# Patient Record
Sex: Female | Born: 1965 | Race: White | Hispanic: No | Smoking: Former smoker
Health system: Southern US, Community
[De-identification: ages and names within clinical notes are randomized; demographics above are authoritative.]

## PROBLEM LIST (undated history)

## (undated) DIAGNOSIS — G4733 Obstructive sleep apnea (adult) (pediatric): Secondary | ICD-10-CM

## (undated) DIAGNOSIS — M545 Low back pain, unspecified: Secondary | ICD-10-CM

## (undated) DIAGNOSIS — J449 Chronic obstructive pulmonary disease, unspecified: Secondary | ICD-10-CM

## (undated) DIAGNOSIS — M109 Gout, unspecified: Secondary | ICD-10-CM

## (undated) DIAGNOSIS — E079 Disorder of thyroid, unspecified: Secondary | ICD-10-CM

## (undated) DIAGNOSIS — M1990 Unspecified osteoarthritis, unspecified site: Secondary | ICD-10-CM

## (undated) DIAGNOSIS — G629 Polyneuropathy, unspecified: Secondary | ICD-10-CM

## (undated) DIAGNOSIS — E039 Hypothyroidism, unspecified: Secondary | ICD-10-CM

## (undated) DIAGNOSIS — G5600 Carpal tunnel syndrome, unspecified upper limb: Secondary | ICD-10-CM

## (undated) DIAGNOSIS — E611 Iron deficiency: Secondary | ICD-10-CM

## (undated) DIAGNOSIS — G8929 Other chronic pain: Secondary | ICD-10-CM

## (undated) DIAGNOSIS — M25512 Pain in left shoulder: Secondary | ICD-10-CM

## (undated) DIAGNOSIS — I10 Essential (primary) hypertension: Secondary | ICD-10-CM

## (undated) DIAGNOSIS — M25569 Pain in unspecified knee: Secondary | ICD-10-CM

## (undated) DIAGNOSIS — E119 Type 2 diabetes mellitus without complications: Secondary | ICD-10-CM

## (undated) DIAGNOSIS — E785 Hyperlipidemia, unspecified: Secondary | ICD-10-CM

## (undated) DIAGNOSIS — J189 Pneumonia, unspecified organism: Secondary | ICD-10-CM

## (undated) DIAGNOSIS — R7303 Prediabetes: Secondary | ICD-10-CM

## (undated) DIAGNOSIS — K219 Gastro-esophageal reflux disease without esophagitis: Secondary | ICD-10-CM

## (undated) DIAGNOSIS — F32A Depression, unspecified: Secondary | ICD-10-CM

## (undated) DIAGNOSIS — Z9981 Dependence on supplemental oxygen: Secondary | ICD-10-CM

## (undated) DIAGNOSIS — F329 Major depressive disorder, single episode, unspecified: Secondary | ICD-10-CM

## (undated) DIAGNOSIS — E7800 Pure hypercholesterolemia, unspecified: Secondary | ICD-10-CM

## (undated) HISTORY — DX: Hyperlipidemia, unspecified: E78.5

## (undated) HISTORY — PX: KNEE SURGERY: SHX244

## (undated) HISTORY — DX: Other chronic pain: G89.29

## (undated) HISTORY — PX: COLONOSCOPY: SHX174

## (undated) HISTORY — DX: Chronic obstructive pulmonary disease, unspecified: J44.9

## (undated) HISTORY — PX: DILATION AND CURETTAGE OF UTERUS: SHX78

## (undated) HISTORY — DX: Gout, unspecified: M10.9

## (undated) HISTORY — PX: CHOLECYSTECTOMY: SHX55

## (undated) HISTORY — PX: TUBAL LIGATION: SHX77

## (undated) HISTORY — DX: Disorder of thyroid, unspecified: E07.9

## (undated) HISTORY — DX: Pain in unspecified knee: M25.569

## (undated) HISTORY — PX: LAPAROSCOPIC CHOLECYSTECTOMY: SUR755

---
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
## 2007-06-23 ENCOUNTER — Ambulatory Visit: Payer: Self-pay | Admitting: Physical Medicine and Rehabilitation

## 2007-06-23 ENCOUNTER — Encounter
Admission: RE | Admit: 2007-06-23 | Discharge: 2007-09-21 | Payer: Self-pay | Admitting: Physical Medicine and Rehabilitation

## 2007-07-15 ENCOUNTER — Encounter
Admission: RE | Admit: 2007-07-15 | Discharge: 2007-07-16 | Payer: Self-pay | Admitting: Physical Medicine and Rehabilitation

## 2007-08-25 HISTORY — PX: CARPAL TUNNEL RELEASE: SHX101

## 2007-08-25 HISTORY — PX: NEUROPLASTY / TRANSPOSITION ULNAR NERVE AT ELBOW: SUR895

## 2007-09-27 ENCOUNTER — Encounter
Admission: RE | Admit: 2007-09-27 | Discharge: 2007-09-27 | Payer: Self-pay | Admitting: Physical Medicine and Rehabilitation

## 2010-09-18 ENCOUNTER — Emergency Department (HOSPITAL_COMMUNITY)
Admission: EM | Admit: 2010-09-18 | Discharge: 2010-09-18 | Payer: Self-pay | Source: Home / Self Care | Admitting: Emergency Medicine

## 2011-01-06 NOTE — Group Therapy Note (Signed)
Tabitha Scott is a 45 year old married obese female who has been referred  by her orthopedist, Dr. Tally Joe, who has a practice in [place], [place].   Tabitha Scott sought Dr. Genoveva Ill assistance in helping to manage her low  back pain.  A note from Dr. Tally Joe is sent over stating that she has had  a previous MRI at [facility] June 2007.  There are no x-rays.  She had  been told she had degenerative disk disease and had a referral to a  neurologist for elbow pain.   AP lateral lumbar radiographs done in Dr. Genoveva Ill office per his note  show bony structure and alignment appearing normal.  No soft tissue  masses identified.  Mild degenerative disk disease at L5-S1, otherwise  disk space is well maintained without any osteophytes.  No  spondylolisthesis or Pars defects noted.  She was assessed to have  mechanical low back pain and was recommended to have therapy and to lose  weight and she was referred to our facility.   She states that her chief complaint today is low back pain.  She does  have multiple other pain complaints including cervicalgia, bilateral arm  pain, bilateral lower extremity pain.  Her number one pain, however, is  her low back.   Pain is worse with standing.  She feels a little better.  She flex a bit  forward while she stands such as when she is doing dishes.  She does put  weight on the counter to do dishes.  She states her average pain is  about a 10 on a scale of 10 interfering significantly with her activity  level and enjoyment of life.  She is able to sleep fairly well at night.  Pain is typically worse during the day, evening, and night, fairly  constant throughout the day.  Pain is worse with walking, sitting,  standing, improves with rest.   She is currently not on any medications for her low back.  She does take  over-the-counter Tylenol 325 mg up to 3 times a day on a p.r.n. basis,  as well as ibuprofen up to 400 mg in the morning.  She has done this  for  the last 2 years.   She is able to walk about 3 minutes at a time.  She has difficulty with  stairs.  She does not drive.  She needs assistance with dressing and  bathing.  She states she no longer wears underwear or a bra because of  difficulty getting these garments on.   She has difficulty with some household duties.  She states part of it is  that she drops things with her left hand frequently and has elbow pain  as well.   She admits to numbness and tingling, trouble walking, spasms,  depression.  Denies suicidal ideation.  Denies problems controlling  bowel or bladder but states that sometimes she cannot get to the  bathroom quite quick enough a couple of times a month.   She does have some complaints of limb swelling, otherwise review of  systems is otherwise noncontributory.   Physicians involved in her care currently include Dr. Nedra Hai who is her  family doctor.  He has referred her to Dr. Tally Joe, her orthopedist, as  well as Dr. Adella Hare who is her neurologist currently.   She states that her neurologist is currently ordering some testing for  her.  She apparently may have a right lung mass and may have already had  a chest CT.  She is also scheduled for a brain MRI to rule out multiple  sclerosis.   I am not sure what other testing she has had done.  I believe it sounds  as though she may have had an EMG nerve conduction study once or twice.  She has also been referred to a psychiatrist.  I do not have the name of  that professional, however.   PAST MEDICAL HISTORY:  1. Notable for morbid obesity.  2. Hypertension.  3. Gastroesophageal reflux disease.  4. Hyperlipidemia.  5. COPD.  6. History of migraines.  7. History of peripheral edema.  8. A vocal cord polyp.  9. History of tobacco use.  10.Pelvic pain.   She is currently not on any medications.  SHE HAS AN ALLERGY TO TETANUS  INJECTION.   SOCIAL HISTORY:  Patient lives with her husband and 3 children,  73 years  old, 50 years old, and 47 years old.  She denies any illegal substance  use.  She denies alcohol use.  She does smoke about a half a pack of  cigarettes a day.  She has cut down dramatically from I believe 2 packs  a day.   FAMILY HISTORY:  Remarkable for lung disease, diabetes, and disability.  She does not have much information regarding her parent's illnesses or  problems.  She does believe there is some cancer in the family as well.   On exam today, her blood pressure is 118/83.  Pulse 84.  Respirations 20  and 97% saturated on room air.  She is well-developed, morbidly obese  female who weighs approximately 308 pounds.   She is oriented x3.  Her speech is clear.  Her affect is bright.  She is  alert.  She is cooperative and pleasant and follows commands without  difficulty.  She is noted to be quite labile during the exam, crying at  multiple times during our interview and during the exam itself.   She is able to transition from sitting to standing with a minimal amount  of difficulty.  Standing she has slightly flexed knees.  She complains  of low back pain while she is standing.  She has limitations in lumbar  motion in all planes and complains when she extends, as well as flexes  even minimally.  Her cervical range of motion is mildly limited,  especially with rotation to the right.  She lacks about 10 degrees.  Flexion/extension appears to be minimally limited as well.  She has full  shoulder range of motion bilaterally.  She has well-healed scars over  her elbow region bilaterally, as well as her palmar surfaces.   Her gait is slightly wide based.  She has difficulty performing tandem  gait due to her size.  A Romberg test is performed adequately, however.   Seated, her reflexes are slightly brisk overall.  Downgoing toes noted  on the right, equivocal is on the left.  No abnormal tone is noted in  the upper or lower extremities.  Motor strength is quite good.   She has  some __________ weakness in both left upper and left lower extremity,  however, with some coaching she is able to give me some at least 4+/5  strength throughout both of these limbs without obvious focal deficit.   Her laboratory sense is intact.  Her position sense is intact in the  lower extremities.  She reports diminished sensation to pin prick  throughout the left upper extremity,  as well as the left lower extremity  in their entirety.  Right upper and lower extremity appeared to be  intact to pin prick, as well as light touch.   She has multiple areas of tenderness throughout her cervical paraspinal  region and parascapular region, medial elbows, medial knees, and in the  lower lumbar paraspinal musculature into the gluteal musculature  exquisitely tender over the lateral epicondyles bilaterally, as well as  the trochanters bilaterally.   IMPRESSION:  This is a 45 year old morbidly obese female who has  multiple pain complaints which include the following:  1. Lumbago.  2. Cervicalgia.  3. Multiple tender points throughout body, may be consistent with      fibromyalgia type overlay.  She has tenderness over the lateral      epicondyles and trochanters consistent with moderate epicondylitis,      as well as trochanteric bursitis cervicalgia.  She has had multiple      arm surgeries related to numbness in both upper extremities and      complaints of dropping items with the left hand.   She is currently undergoing workup with her neurologist.  I really do  not have any details regarding this workup.  We would like to get some  more information regarding her previous studies and current studies that  have been ordered prior to providing a pain care plan at this point.  We  will trial her with some Lidoderm, may consider something like BioFreeze  as well.  Physical therapy may also be an option for her addressing some  of the tendinitis she seems to have.  May consider  addressing tender  points treating her fibromyalgia as well.  However, we need to really  get some more information on this woman before engaging in multiple  treatments.  We will see her back in a month.  I will attempt to get a  hold of Dr. Adella Hare.           ______________________________  Brantley Stage, M.D.     DMK/MedQ  D:  06/24/2007 10:57:52  T:  06/24/2007 17:08:38  Job #:  [id number]

## 2011-01-06 NOTE — Assessment & Plan Note (Signed)
Tabitha Scott is a 45 year old, married, obese female who has been referred  by Dr. Tally Joe.  She is accompanied by her husband today for recheck here  in our pain and rehabilitative clinic.  She was initially seen on  October 21st, last month.  She had been following up with Dr. Adella Hare.  In the interim, I have given him a call and he was kind enough to send  over some notes regarding a workup he was in the process of with her.  His last note dated June 27, 2007, states that he has ordered a CT  scan of her chest, also an MRI of her brain.  There had been some  concern about possible demyelinating disease as cause for her numbness.  She has also in the interim, seen psychiatry who is treating her with  Zoloft for her depression and anxiety.  She has got questions regarding  disability today.   She states her average pain is about an 8-10 on a scale of 10.  Her pain  is predominantly localized to the cervical region and lumbar region.  She describes cervical region as about an 8 on a scale of 10, the lumbar  is about a 10 on a scale of 10.  The left elbow area is about an 8.  The  right elbow area is about a 6.  She gets some intermittent numbness when  she is up walking in the regions around both knees.  Neck is described  as feeling somewhat stiff.  Back pain is sharper in nature.  The pain is  typically worse with activity and also is bothersome when she is  sedentary.  The pain improves with rest overall.   MEDICATIONS:  She was tried on Lidoderm last month.  She is not sure it  gave her much relief.   She is able to walk about 5 minutes at a time.  She does not drive.  She  reports requiring some assistance getting her bra on and washing her  hair.  Independent with feeding and toileting.  She needs some  assistance with meal prep, household duties, and shopping.   She admits to some depression, denies suicidal ideation.  She admits to  some numbness and tingling in her  extremities.   Currently, she is followed by Dr. Nedra Hai, Dr. Adella Hare, and Dr. Estill Batten.   The patient smokes about a half pack of cigarettes a day.   PHYSICAL EXAMINATION:  VITAL SIGNS:  Blood pressure is 151/84, pulse 83,  respirations 18, 98% saturated on room air.  GENERAL:  She is an obese, white female who does not appear in any  distress.  NEUROLOGIC:  She is oriented x3.  Her speech is clear.  Her affect is  bright.  She is alert, cooperative, and pleasant.  She follows commands  without difficulty.  MUSCULOSKELETAL:  Transitioning from sitting to standing is done without  difficulty.  Gait in the room displays good balance.  Tandem gait and  Romberg tests are performed adequately.  Mild limitations are noted in  cervical range of motion, especially with rotation to the right.  She  has full shoulder range of motion.  Lumbar range is minimally limited as  well.  She reports significant discomfort with palpation, especially in  the right lumbar paraspinal muscles.  Reflexes are 2 plus in the upper  extremities as well as lower extremities, they are intact.  No abnormal  tone is noted.  Motor strength is 5/5 in  both upper and lower  extremities.  She is noted to have bilateral lower extremity edema.   IMPRESSION:  1. Morbid obesity.  2. Bilateral lymphedema.  3. Lumbago.  4. Cervicalgia.  5. Intermittent tingling in all extremities, multiple tender points.   She is currently undergoing workup for multiple sclerosis.  Apparently  there may have been a lesion found in the MRI.  I do not have records  regarding this.   PLAN:  1. We will trial her on some Neurontin 300 mg one p.o. q.h.s. x3 days,      then b.i.d. x3 days, and then t.i.d. for the rest of the month.  2. We will also add Nexium 20 mg one p.o. daily, #30.  3. Discussed how to use ibuprofen and Tylenol appropriately,      discussing appropriate doses and use of both of these medications.  4. We will see her back  in 6 weeks and assess how she is doing on the      Neurontin at that time.           ______________________________  Brantley Stage, M.D.     DMK/MedQ  D:  07/18/2007 14:43:01  T:  07/18/2007 16:59:29  Job #:  [id number]

## 2013-08-24 HISTORY — PX: JOINT REPLACEMENT: SHX530

## 2013-10-27 DIAGNOSIS — N871 Moderate cervical dysplasia: Secondary | ICD-10-CM | POA: Insufficient documentation

## 2015-03-12 ENCOUNTER — Emergency Department (HOSPITAL_COMMUNITY)
Admission: EM | Admit: 2015-03-12 | Discharge: 2015-03-12 | Disposition: A | Discharge status: Home / Self Care | Payer: Medicaid Other | Attending: Emergency Medicine | Admitting: Emergency Medicine

## 2015-03-12 ENCOUNTER — Encounter (HOSPITAL_COMMUNITY): Payer: Self-pay | Admitting: Physical Medicine and Rehabilitation

## 2015-03-12 DIAGNOSIS — R609 Edema, unspecified: Secondary | ICD-10-CM

## 2015-03-12 DIAGNOSIS — Z7951 Long term (current) use of inhaled steroids: Secondary | ICD-10-CM | POA: Insufficient documentation

## 2015-03-12 DIAGNOSIS — Z760 Encounter for issue of repeat prescription: Secondary | ICD-10-CM

## 2015-03-12 DIAGNOSIS — N289 Disorder of kidney and ureter, unspecified: Secondary | ICD-10-CM | POA: Insufficient documentation

## 2015-03-12 DIAGNOSIS — R600 Localized edema: Secondary | ICD-10-CM | POA: Insufficient documentation

## 2015-03-12 DIAGNOSIS — M79605 Pain in left leg: Secondary | ICD-10-CM | POA: Diagnosis present

## 2015-03-12 DIAGNOSIS — Z79899 Other long term (current) drug therapy: Secondary | ICD-10-CM | POA: Insufficient documentation

## 2015-03-12 LAB — CBC WITH DIFFERENTIAL/PLATELET
BASOS ABS: 0 10*3/uL (ref 0.0–0.1)
BASOS PCT: 0 % (ref 0–1)
EOS PCT: 3 % (ref 0–5)
Eosinophils Absolute: 0.2 10*3/uL (ref 0.0–0.7)
HCT: 33.3 % — ABNORMAL LOW (ref 36.0–46.0)
Hemoglobin: 10.4 g/dL — ABNORMAL LOW (ref 12.0–15.0)
LYMPHS ABS: 2.4 10*3/uL (ref 0.7–4.0)
Lymphocytes Relative: 41 % (ref 12–46)
MCH: 28.6 pg (ref 26.0–34.0)
MCHC: 31.2 g/dL (ref 30.0–36.0)
MCV: 91.5 fL (ref 78.0–100.0)
Monocytes Absolute: 0.4 10*3/uL (ref 0.1–1.0)
Monocytes Relative: 7 % (ref 3–12)
Neutro Abs: 3 10*3/uL (ref 1.7–7.7)
Neutrophils Relative %: 49 % (ref 43–77)
Platelets: 188 10*3/uL (ref 150–400)
RBC: 3.64 MIL/uL — AB (ref 3.87–5.11)
RDW: 16.2 % — ABNORMAL HIGH (ref 11.5–15.5)
WBC: 6 10*3/uL (ref 4.0–10.5)

## 2015-03-12 LAB — COMPREHENSIVE METABOLIC PANEL
ALBUMIN: 3.5 g/dL (ref 3.5–5.0)
ALT: 27 U/L (ref 14–54)
ANION GAP: 8 (ref 5–15)
AST: 32 U/L (ref 15–41)
Alkaline Phosphatase: 66 U/L (ref 38–126)
BILIRUBIN TOTAL: 0.8 mg/dL (ref 0.3–1.2)
BUN: 6 mg/dL (ref 6–20)
CHLORIDE: 105 mmol/L (ref 101–111)
CO2: 27 mmol/L (ref 22–32)
Calcium: 8.9 mg/dL (ref 8.9–10.3)
Creatinine, Ser: 1.16 mg/dL — ABNORMAL HIGH (ref 0.44–1.00)
GFR calc non Af Amer: 54 mL/min — ABNORMAL LOW (ref >60)
Glucose, Bld: 90 mg/dL (ref 65–99)
Potassium: 3.5 mmol/L (ref 3.5–5.1)
Sodium: 140 mmol/L (ref 135–145)
TOTAL PROTEIN: 6.6 g/dL (ref 6.5–8.1)

## 2015-03-12 LAB — BRAIN NATRIURETIC PEPTIDE: B NATRIURETIC PEPTIDE 5: 98.7 pg/mL (ref 0.0–100.0)

## 2015-03-12 MED ORDER — FUROSEMIDE 40 MG PO TABS: 40.0000 mg | ORAL_TABLET | Freq: Every day | ORAL | Status: DC

## 2015-03-12 MED ORDER — GABAPENTIN 300 MG PO CAPS
300.0000 mg | ORAL_CAPSULE | ORAL | Status: AC
  Administered 2015-03-12: 300 mg via ORAL
  Filled 2015-03-12: qty 1

## 2015-03-12 MED ORDER — FUROSEMIDE 10 MG/ML IJ SOLN
40.0000 mg | Freq: Once | INTRAMUSCULAR | Status: AC
  Administered 2015-03-12: 40 mg via INTRAVENOUS
  Filled 2015-03-12: qty 4

## 2015-03-12 MED ORDER — GABAPENTIN 600 MG PO TABS
300.0000 mg | ORAL_TABLET | ORAL | Status: DC
  Filled 2015-03-12: qty 0.5

## 2015-03-12 MED ORDER — METOLAZONE 5 MG PO TABS: 5.0000 mg | ORAL_TABLET | Freq: Every day | ORAL | Status: DC

## 2015-03-12 MED ORDER — GABAPENTIN 300 MG PO CAPS: 300.0000 mg | ORAL_CAPSULE | Freq: Two times a day (BID) | ORAL | Status: DC

## 2015-03-12 NOTE — ED Notes (Signed)
Pt presents to department for evaluation of bilateral leg and foot pain. States she ran out of Lasix several months ago. Now states 6/10 pain to bilateral legs and feet. Pt is alert and oriented x4.

## 2015-03-12 NOTE — Discharge Instructions (Signed)
Read the information below.  Use the prescribed medication as directed.  Please discuss all new medications with your pharmacist.  You may return to the Emergency Department at any time for worsening condition or any new symptoms that concern you.    Please follow up with Dr Ashley RoyaltyMatthews within the next 1-2 weeks to establish care and recheck your kidney function.    Edema Edema is an abnormal buildup of fluids. It is more common in your legs and thighs. Painless swelling of the feet and ankles is more likely as a person ages. It also is common in looser skin, like around your eyes. HOME CARE   Keep the affected body part above the level of the heart while lying down.  Do not sit still or stand for a long time.  Do not put anything right under your knees when you lie down.  Do not wear tight clothes on your upper legs.  Exercise your legs to help the puffiness (swelling) go down.  Wear elastic bandages or support stockings as told by your doctor.  A low-salt diet may help lessen the puffiness.  Only take medicine as told by your doctor. GET HELP IF:  Treatment is not working.  You have heart, liver, or kidney disease and notice that your skin looks puffy or shiny.  You have puffiness in your legs that does not get better when you raise your legs.  You have sudden weight gain for no reason. GET HELP RIGHT AWAY IF:   You have shortness of breath or chest pain.  You cannot breathe when you lie down.  You have pain, redness, or warmth in the areas that are puffy.  You have heart, liver, or kidney disease and get edema all of a sudden.  You have a fever and your symptoms get worse all of a sudden. MAKE SURE YOU:   Understand these instructions.  Will watch your condition.  Will get help right away if you are not doing well or get worse. Document Released: 01/27/2008 Document Revised: 08/15/2013 Document Reviewed: 06/02/2013 [facility] Patient Information 2015 [place], [place].  This information is not intended to replace advice given to you by your health care provider. Make sure you discuss any questions you have with your health care provider.  Medication Refill, Emergency Department We have refilled your medication today as a courtesy to you. It is best for your medical care, however, to take care of getting refills done through your primary caregiver's office. They have your records and can do a better job of follow-up than we can in the emergency department. On maintenance medications, we often only prescribe enough medications to get you by until you are able to see your regular caregiver. This is a more expensive way to refill medications. In the future, please plan for refills so that you will not have to use the emergency department for this. Thank you for your help. Your help allows us to better take care of the daily emergencies that enter our department. Document Released: 11/27/2003 Document Revised: 11/02/2011 Document Reviewed: 11/17/2013 [facility]ExitCare Patient Information 2015 [place], [place]. This information is not intended to replace advice given to you by your health care provider. Make sure you discuss any questions you have with your health care provider.

## 2015-03-12 NOTE — ED Provider Notes (Signed)
CSN: [id number]     Arrival date & time 03/12/15  1252 History   First MD Initiated Contact with Patient 03/12/15 1608     Chief Complaint  Patient presents with  . Leg Pain  . Foot Pain     (Consider location/radiation/quality/duration/timing/severity/associated sxs/prior Treatment) The history is provided by the patient.     Pt  With hx neuropathy, peripheral edema, unknown other medical problems p/w two months of swelling and pain of the bilateral lower extremities.  States she has been unable to fill some of her prescriptions due to problems with medicaid.   States she does not think she has diabetes or heart problems but does not know why her legs swell.   Denies recent immobilization, exogenous estrogen, history of blood clots.  Denies fevers, chills, CP, SOB, cough.  She is not out of lasix but is out of metolazone.    History reviewed. No pertinent past medical history. History reviewed. No pertinent past surgical history. No family history on file. History  Substance Use Topics  . Smoking status: Never Smoker   . Smokeless tobacco: Not on file  . Alcohol Use: No   OB History    No data available     Review of Systems  All other systems reviewed and are negative.     Allergies  Tetanus toxoids  Home Medications   Prior to Admission medications   Medication Sig Start Date End Date Taking? Authorizing Provider  atorvastatin (LIPITOR) 40 MG tablet Take 40 mg by mouth daily.   Yes Historical Provider, MD  Fluticasone-Salmeterol (ADVAIR) 250-50 MCG/DOSE AEPB Inhale 1 puff into the lungs 2 (two) times daily.   Yes Historical Provider, MD  furosemide (LASIX) 40 MG tablet Take 40 mg by mouth daily.   Yes Historical Provider, MD  gabapentin (NEURONTIN) 300 MG capsule Take 300 mg by mouth 2 (two) times daily.   Yes Historical Provider, MD  levothyroxine (SYNTHROID, LEVOTHROID) 25 MCG tablet Take 25 mcg by mouth daily before breakfast.   Yes Historical Provider, MD   linagliptin (TRADJENTA) 5 MG TABS tablet Take 5 mg by mouth daily.   Yes Historical Provider, MD  Liraglutide (VICTOZA) 18 MG/3ML SOPN Inject 1.8 mg into the skin daily.   Yes Historical Provider, MD  meloxicam (MOBIC) 15 MG tablet Take 15 mg by mouth daily.   Yes Historical Provider, MD  metolazone (ZAROXOLYN) 5 MG tablet Take 5 mg by mouth daily. 30 mins prior to furosemide   Yes Historical Provider, MD  omeprazole (PRILOSEC) 40 MG capsule Take 40 mg by mouth daily.   Yes Historical Provider, MD  potassium chloride (MICRO-K) 10 MEQ CR capsule Take 10 mEq by mouth daily.   Yes Historical Provider, MD  tiZANidine (ZANAFLEX) 4 MG capsule Take 4 mg by mouth 3 (three) times daily as needed for muscle spasms.   Yes Historical Provider, MD   BP 146/56 mmHg  Pulse 87  Temp(Src) 98.6 F (37 C) (Oral)  Resp 16  Ht  (1.626 m)  Wt 280 lb (127.007 kg)  BMI 48.04 kg/m2  SpO2 95% Physical Exam  Constitutional: She appears well-developed and well-nourished. No distress.  HENT:  Head: Normocephalic and atraumatic.  Neck: Neck supple.  Cardiovascular: Normal rate and regular rhythm.   Tight nonpitting edema of bilateral lower extremities to proximal shin.  No erythema, warmth.  Distal pulses intact, sensation intact, motor ability intact.    Pulmonary/Chest: Effort normal and breath sounds normal. No respiratory distress. She  has no wheezes. She has no rales.  Abdominal: Soft. She exhibits no distension. There is no tenderness. There is no rebound and no guarding.  obese  Neurological: She is alert.  Skin: She is not diaphoretic.  Psychiatric: She has a normal mood and affect. Her behavior is normal.  Nursing note and vitals reviewed.   ED Course  Procedures (including critical care time) Labs Review Labs Reviewed  COMPREHENSIVE METABOLIC PANEL - Abnormal; Notable for the following:    Creatinine, Ser 1.16 (*)    GFR calc non Af Amer 54 (*)    All other components within normal limits   CBC WITH DIFFERENTIAL/PLATELET - Abnormal; Notable for the following:    RBC 3.64 (*)    Hemoglobin 10.4 (*)    HCT 33.3 (*)    RDW 16.2 (*)    All other components within normal limits  BRAIN NATRIURETIC PEPTIDE    Imaging Review No results found.   EKG Interpretation None      MDM   Final diagnoses:  Peripheral edema  Renal insufficiency  Medication refill    Afebrile, nontoxic patient with chronic peripheral edema on lasix and metolazone though she has run out of the metolazone.  She c/o bilateral lower extremity swelling and associated pain x months.  No risk factors for DVT.  Doesn't appear cellulitis.  Workup unremarkable.  No CHF.  Renal and hepatic function checked, pt does have some renal insufficiency - will need to have diuretics monitored closely.  Pt to start seeing Dr Marthann SchillerMichelle Matthews soon.  Pt feeling better after lasix and gabapentin in ED.  D/C home with refill of her gabapentin, lasix, metolazone, close PCP follow up.  Discussed result, findings, treatment, and follow up  with patient.  Pt given return precautions.  Pt verbalizes understanding and agrees with plan.         PennockEmily Scottie Stanish, PA-C 03/13/15 [id number]  Margarita Grizzleanielle Ray, MD 03/14/15 [phone]

## 2015-03-30 ENCOUNTER — Encounter (HOSPITAL_COMMUNITY): Payer: Self-pay | Admitting: Family Medicine

## 2015-03-30 ENCOUNTER — Emergency Department (HOSPITAL_COMMUNITY)
Admission: EM | Admit: 2015-03-30 | Discharge: 2015-03-30 | Disposition: A | Discharge status: Home / Self Care | Payer: Medicaid Other | Attending: Emergency Medicine | Admitting: Emergency Medicine

## 2015-03-30 DIAGNOSIS — R609 Edema, unspecified: Secondary | ICD-10-CM | POA: Diagnosis not present

## 2015-03-30 DIAGNOSIS — G629 Polyneuropathy, unspecified: Secondary | ICD-10-CM | POA: Insufficient documentation

## 2015-03-30 DIAGNOSIS — R2243 Localized swelling, mass and lump, lower limb, bilateral: Secondary | ICD-10-CM | POA: Diagnosis present

## 2015-03-30 DIAGNOSIS — Z7951 Long term (current) use of inhaled steroids: Secondary | ICD-10-CM | POA: Diagnosis not present

## 2015-03-30 DIAGNOSIS — Z791 Long term (current) use of non-steroidal anti-inflammatories (NSAID): Secondary | ICD-10-CM | POA: Diagnosis not present

## 2015-03-30 DIAGNOSIS — G8929 Other chronic pain: Secondary | ICD-10-CM | POA: Insufficient documentation

## 2015-03-30 DIAGNOSIS — Z79899 Other long term (current) drug therapy: Secondary | ICD-10-CM | POA: Insufficient documentation

## 2015-03-30 HISTORY — DX: Polyneuropathy, unspecified: G62.9

## 2015-03-30 LAB — URINALYSIS, ROUTINE W REFLEX MICROSCOPIC
Bilirubin Urine: NEGATIVE
GLUCOSE, UA: NEGATIVE mg/dL
Hgb urine dipstick: NEGATIVE
Ketones, ur: NEGATIVE mg/dL
LEUKOCYTES UA: NEGATIVE
NITRITE: NEGATIVE
PROTEIN: NEGATIVE mg/dL
SPECIFIC GRAVITY, URINE: 1.008 (ref 1.005–1.030)
Urobilinogen, UA: 0.2 mg/dL (ref 0.0–1.0)
pH: 5 (ref 5.0–8.0)

## 2015-03-30 LAB — CBC
HEMATOCRIT: 31.4 % — AB (ref 36.0–46.0)
HEMOGLOBIN: 9.9 g/dL — AB (ref 12.0–15.0)
MCH: 28.6 pg (ref 26.0–34.0)
MCHC: 31.5 g/dL (ref 30.0–36.0)
MCV: 90.8 fL (ref 78.0–100.0)
PLATELETS: 203 10*3/uL (ref 150–400)
RBC: 3.46 MIL/uL — AB (ref 3.87–5.11)
RDW: 16.8 % — AB (ref 11.5–15.5)
WBC: 8.6 10*3/uL (ref 4.0–10.5)

## 2015-03-30 LAB — BASIC METABOLIC PANEL
ANION GAP: 10 (ref 5–15)
BUN: 8 mg/dL (ref 6–20)
CALCIUM: 8.4 mg/dL — AB (ref 8.9–10.3)
CHLORIDE: 95 mmol/L — AB (ref 101–111)
CO2: 30 mmol/L (ref 22–32)
CREATININE: 1.25 mg/dL — AB (ref 0.44–1.00)
GFR calc Af Amer: 57 mL/min — ABNORMAL LOW (ref >60)
GFR calc non Af Amer: 49 mL/min — ABNORMAL LOW (ref >60)
Glucose, Bld: 97 mg/dL (ref 65–99)
Potassium: 2.9 mmol/L — ABNORMAL LOW (ref 3.5–5.1)
Sodium: 135 mmol/L (ref 135–145)

## 2015-03-30 LAB — BRAIN NATRIURETIC PEPTIDE: B NATRIURETIC PEPTIDE 5: 153.8 pg/mL — AB (ref 0.0–100.0)

## 2015-03-30 MED ORDER — HYDROCODONE-ACETAMINOPHEN 5-325 MG PO TABS: 1.0000 | ORAL_TABLET | ORAL | Status: DC | PRN

## 2015-03-30 MED ORDER — METOLAZONE 5 MG PO TABS
5.0000 mg | ORAL_TABLET | Freq: Every day | ORAL | Status: DC
Start: 2015-03-30 — End: 2015-04-09

## 2015-03-30 MED ORDER — POTASSIUM CHLORIDE CRYS ER 20 MEQ PO TBCR
40.0000 meq | EXTENDED_RELEASE_TABLET | Freq: Once | ORAL | Status: AC
  Administered 2015-03-30: 40 meq via ORAL
  Filled 2015-03-30: qty 2

## 2015-03-30 MED ORDER — HYDROCODONE-ACETAMINOPHEN 5-325 MG PO TABS
1.0000 | ORAL_TABLET | Freq: Once | ORAL | Status: AC
  Administered 2015-03-30: 1 via ORAL
  Filled 2015-03-30: qty 1

## 2015-03-30 MED ORDER — POTASSIUM CHLORIDE ER 10 MEQ PO CPCR: 10.0000 meq | ORAL_CAPSULE | Freq: Every day | ORAL | Status: DC

## 2015-03-30 NOTE — ED Notes (Signed)
Pt here for bilateral feet and leg swelling. Sts seen here for the same prior and she has ran out of fluid pills. sts also the pain meds are not working.

## 2015-03-30 NOTE — ED Notes (Signed)
Pt alert x4 respirations easy non labored.  

## 2015-03-30 NOTE — Discharge Instructions (Signed)

## 2015-03-30 NOTE — ED Provider Notes (Signed)
CSN: [id number]     Arrival date & time 03/30/15  1250 History   First MD Initiated Contact with Patient 03/30/15 1328     Chief Complaint  Patient presents with  . Leg Swelling     (Consider location/radiation/quality/duration/timing/severity/associated sxs/prior Treatment) HPI Comments: Patient presents to the ER for evaluation of swelling of her legs. Patient reports that she has a history of chronic edema of both lower extremities. She has recently moved to the area, has not established primary care area. She does have an appointment with a new primary doctor in 2 weeks. Patient reports that she has run out of her Zaroxolyn and since has started to have increased swelling of her legs. She has not had any chest pain or shortness of breath.   Past Medical History  Diagnosis Date  . Neuropathy    Past Surgical History  Procedure Laterality Date  . Cholecystectomy     History reviewed. No pertinent family history. History  Substance Use Topics  . Smoking status: Never Smoker   . Smokeless tobacco: Not on file  . Alcohol Use: No   OB History    No data available     Review of Systems  Cardiovascular: Positive for leg swelling.  All other systems reviewed and are negative.     Allergies  Tetanus toxoids  Home Medications   Prior to Admission medications   Medication Sig Start Date End Date Taking? Authorizing Provider  atorvastatin (LIPITOR) 40 MG tablet Take 40 mg by mouth daily.    Historical Provider, MD  Fluticasone-Salmeterol (ADVAIR) 250-50 MCG/DOSE AEPB Inhale 1 puff into the lungs 2 (two) times daily.    Historical Provider, MD  furosemide (LASIX) 40 MG tablet Take 1 tablet (40 mg total) by mouth daily. 03/12/15   Trixie Dredge, PA-C  gabapentin (NEURONTIN) 300 MG capsule Take 1 capsule (300 mg total) by mouth 2 (two) times daily. 03/12/15   Trixie Dredge, PA-C  levothyroxine (SYNTHROID, LEVOTHROID) 25 MCG tablet Take 25 mcg by mouth daily before breakfast.     Historical Provider, MD  linagliptin (TRADJENTA) 5 MG TABS tablet Take 5 mg by mouth daily.    Historical Provider, MD  Liraglutide (VICTOZA) 18 MG/3ML SOPN Inject 1.8 mg into the skin daily.    Historical Provider, MD  meloxicam (MOBIC) 15 MG tablet Take 15 mg by mouth daily.    Historical Provider, MD  metolazone (ZAROXOLYN) 5 MG tablet Take 1 tablet (5 mg total) by mouth daily. 30 mins prior to furosemide 03/12/15   Trixie Dredge, PA-C  omeprazole (PRILOSEC) 40 MG capsule Take 40 mg by mouth daily.    Historical Provider, MD  potassium chloride (MICRO-K) 10 MEQ CR capsule Take 10 mEq by mouth daily.    Historical Provider, MD  tiZANidine (ZANAFLEX) 4 MG capsule Take 4 mg by mouth 3 (three) times daily as needed for muscle spasms.    Historical Provider, MD   BP 113/39 mmHg  Pulse 85  Temp(Src) 98.1 F (36.7 C) (Oral)  Resp 18  SpO2 96% Physical Exam  Constitutional: She is oriented to person, place, and time. She appears well-developed and well-nourished. No distress.  HENT:  Head: Normocephalic and atraumatic.  Right Ear: Hearing normal.  Left Ear: Hearing normal.  Nose: Nose normal.  Mouth/Throat: Oropharynx is clear and moist and mucous membranes are normal.  Eyes: Conjunctivae and EOM are normal. Pupils are equal, round, and reactive to light.  Neck: Normal range of motion. Neck supple.  Cardiovascular:  Regular rhythm, S1 normal and S2 normal.  Exam reveals no gallop and no friction rub.   No murmur heard. Pulmonary/Chest: Effort normal and breath sounds normal. No respiratory distress. She exhibits no tenderness.  Abdominal: Soft. Normal appearance and bowel sounds are normal. There is no hepatosplenomegaly. There is no tenderness. There is no rebound, no guarding, no tenderness at McBurney's point and negative Murphy's sign. No hernia.  Musculoskeletal: Normal range of motion. She exhibits edema (bilateral nonpitting lower leg swelling and edema, symmetric).  Neurological: She is  alert and oriented to person, place, and time. She has normal strength. No cranial nerve deficit or sensory deficit. Coordination normal. GCS eye subscore is 4. GCS verbal subscore is 5. GCS motor subscore is 6.  Skin: Skin is warm, dry and intact. No rash noted. No cyanosis.  Psychiatric: She has a normal mood and affect. Her speech is normal and behavior is normal. Thought content normal.  Nursing note and vitals reviewed.   ED Course  Procedures (including critical care time) Labs Review Labs Reviewed  CBC  BASIC METABOLIC PANEL  URINALYSIS, ROUTINE W REFLEX MICROSCOPIC (NOT AT [facility])  BRAIN NATRIURETIC PEPTIDE    Imaging Review No results found.   EKG Interpretation None      MDM   Final diagnoses:  None   peripheral edema  Patient presents to the emergency department for evaluation of lower extremity edema. He reports that she does have a history of chronic edema. She normally takes Lasix and Zaroxolyn. Patient has run out of her Zaroxolyn and her swelling has worsened. She reports that this has happened before. She is not expressing any chest pain or shortness of breath. Lab work was normal other than mild hypokalemia. Patient given potassium here in the ER. She will not be given additional potassium, however, because her Edd Fabian will be restarted. She has follow-up with primary care scheduled for 2 weeks from now.   Gilda Crease, MD 03/30/15 1534

## 2015-04-09 ENCOUNTER — Encounter: Payer: Self-pay | Admitting: Family Medicine

## 2015-04-09 ENCOUNTER — Ambulatory Visit (INDEPENDENT_AMBULATORY_CARE_PROVIDER_SITE_OTHER): Payer: Medicaid Other | Admitting: Family Medicine

## 2015-04-09 VITALS — BP 131/74 | HR 95 | Temp 98.1°F | Resp 18 | Ht 64.0 in | Wt 283.0 lb

## 2015-04-09 DIAGNOSIS — E039 Hypothyroidism, unspecified: Secondary | ICD-10-CM | POA: Insufficient documentation

## 2015-04-09 DIAGNOSIS — Z7689 Persons encountering health services in other specified circumstances: Secondary | ICD-10-CM

## 2015-04-09 DIAGNOSIS — E118 Type 2 diabetes mellitus with unspecified complications: Secondary | ICD-10-CM

## 2015-04-09 DIAGNOSIS — M51369 Other intervertebral disc degeneration, lumbar region without mention of lumbar back pain or lower extremity pain: Secondary | ICD-10-CM | POA: Insufficient documentation

## 2015-04-09 DIAGNOSIS — E7800 Pure hypercholesterolemia, unspecified: Secondary | ICD-10-CM

## 2015-04-09 DIAGNOSIS — M5136 Other intervertebral disc degeneration, lumbar region: Secondary | ICD-10-CM

## 2015-04-09 DIAGNOSIS — Z7189 Other specified counseling: Secondary | ICD-10-CM

## 2015-04-09 DIAGNOSIS — R600 Localized edema: Secondary | ICD-10-CM | POA: Insufficient documentation

## 2015-04-09 DIAGNOSIS — E119 Type 2 diabetes mellitus without complications: Secondary | ICD-10-CM | POA: Insufficient documentation

## 2015-04-09 MED ORDER — FUROSEMIDE 40 MG PO TABS: 40.0000 mg | ORAL_TABLET | Freq: Every day | ORAL | Status: DC

## 2015-04-09 MED ORDER — ATORVASTATIN CALCIUM 40 MG PO TABS: 40.0000 mg | ORAL_TABLET | Freq: Every day | ORAL | Status: DC

## 2015-04-09 MED ORDER — METOLAZONE 5 MG PO TABS: 5.0000 mg | ORAL_TABLET | Freq: Every day | ORAL | Status: DC

## 2015-04-09 MED ORDER — LIRAGLUTIDE 18 MG/3ML ~~LOC~~ SOPN: 1.8000 mg | PEN_INJECTOR | Freq: Every day | SUBCUTANEOUS | Status: DC

## 2015-04-09 MED ORDER — POTASSIUM CHLORIDE ER 10 MEQ PO CPCR: 10.0000 meq | ORAL_CAPSULE | Freq: Every day | ORAL | Status: DC

## 2015-04-09 MED ORDER — OMEPRAZOLE 40 MG PO CPDR: 40.0000 mg | DELAYED_RELEASE_CAPSULE | Freq: Every day | ORAL | Status: DC

## 2015-04-09 MED ORDER — LEVOTHYROXINE SODIUM 25 MCG PO TABS: 25.0000 ug | ORAL_TABLET | Freq: Every day | ORAL | Status: DC

## 2015-04-09 MED ORDER — GABAPENTIN 300 MG PO CAPS: 300.0000 mg | ORAL_CAPSULE | Freq: Two times a day (BID) | ORAL | Status: DC

## 2015-04-09 MED ORDER — LINAGLIPTIN 5 MG PO TABS: 5.0000 mg | ORAL_TABLET | Freq: Every day | ORAL | Status: DC

## 2015-04-09 NOTE — Patient Instructions (Signed)
Continue current medications Return in one month for recheck.

## 2015-04-09 NOTE — Progress Notes (Signed)
Patient ID: Tabitha Scott, female   DOB: 15-Jul-1966, 49 y.o.   MRN: [id number]   Tabitha Scott, is a 49 y.o. female  [id number]:[id number]  [id number]:[id number]  DOB - 05/03/1966  CC:  Chief Complaint  Patient presents with  . Establish Care    medication management       HPI: Tabitha Scott is a 49 y.o. female here establsihed. She has recently moved here from [place], [place]. She has a history of Diabetes, with peripheral neuropathy, DDD, sleep apnea, hypothyroidism, pedal edema, hypercholesterolemia and COPD She reports note recently having any problems with her COPD and is not using advair or any albuterol. She needs refills on several medications today.  He medications are listed and reviewed in her medication list. We need records from her previous provider and from [facility] to determine her health maintenance needs.  Allergies  Allergen Reactions  . Tetanus Toxoids Swelling   Past Medical History  Diagnosis Date  . Neuropathy   . Diabetes mellitus without complication   . COPD (chronic obstructive pulmonary disease)   . Hyperlipidemia   . Oxygen deficiency     prescribed but doesn't use regularly  . Thyroid disease    Current Outpatient Prescriptions on File Prior to Visit  Medication Sig Dispense Refill  . furosemide (LASIX) 40 MG tablet Take 1 tablet (40 mg total) by mouth daily. 30 tablet 0  . levothyroxine (SYNTHROID, LEVOTHROID) 25 MCG tablet Take 25 mcg by mouth daily before breakfast.    . linagliptin (TRADJENTA) 5 MG TABS tablet Take 5 mg by mouth daily.    . meloxicam (MOBIC) 15 MG tablet Take 15 mg by mouth daily.    . metolazone (ZAROXOLYN) 5 MG tablet Take 1 tablet (5 mg total) by mouth daily. 30 mins prior to furosemide 30 tablet 0  . omeprazole (PRILOSEC) 40 MG capsule Take 40 mg by mouth daily.    . potassium chloride (MICRO-K) 10 MEQ CR capsule Take 1 capsule (10 mEq total) by mouth daily. 30 capsule 0  . tiZANidine (ZANAFLEX) 4 MG capsule Take 4 mg by mouth 3  (three) times daily as needed for muscle spasms.    Marland Kitchen atorvastatin (LIPITOR) 40 MG tablet Take 40 mg by mouth daily.    . Fluticasone-Salmeterol (ADVAIR) 250-50 MCG/DOSE AEPB Inhale 1 puff into the lungs 2 (two) times daily.    Marland Kitchen gabapentin (NEURONTIN) 300 MG capsule Take 1 capsule (300 mg total) by mouth 2 (two) times daily. 30 capsule 0  . HYDROcodone-acetaminophen (NORCO/VICODIN) 5-325 MG per tablet Take 1-2 tablets by mouth every 4 (four) hours as needed for moderate pain. (Patient not taking: Reported on 04/09/2015) 15 tablet 0  . Liraglutide (VICTOZA) 18 MG/3ML SOPN Inject 1.8 mg into the skin daily.     No current facility-administered medications on file prior to visit.   Family History  Problem Relation Age of Onset  . COPD Mother   . Heart disease Father   . Cancer Maternal Grandmother    Social History   Social History  . Marital Status: Single    Spouse Name: N/A  . Number of Children: N/A  . Years of Education: N/A   Occupational History  . Not on file.   Social History Main Topics  . Smoking status: Former Smoker    Quit date: 01/11/2015  . Smokeless tobacco: Not on file  . Alcohol Use: No  . Drug Use: No  . Sexual Activity: No   Other Topics Concern  .  Not on file   Social History Narrative    Review of Systems: Constitutional: Negative for fever, chills, weight loss,  Fatigue.Positive for some weight gain HENT: Negative for ear pain, ear discharge.nose bleeds Eyes: Negative for pain, discharge, redness, itching and visual disturbance.Positive for decreased near vision Neck: Negative for pain, stiffness Respiratory: Negative for cough, shortness of breath,   Cardiovascular: Negative for chest pain, palpitations. Positive for pedal edema Gastrointestinal: Negative for abdominal distention, abdominal pain, nausea, vomiting, diarrhea, constipations Genitourinary: Negative for dysuria, urgency, frequency, hematuria, flank pain,  Musculoskeletal: Negative for  joint pain, joint  swelling, arthralgia and gait problem.Negative for weakness.Positive for back pain from DDD Neurological: Negative for dizziness, tremors, seizures, syncope,   light-headedness, numbness and headaches.  Hematological: Negative for easy bruising or bleeding Psychiatric/Behavioral: Negative for depression, anxiety, decreased concentration, confusion. She feels she is becoming forgetful    Objective:   Filed Vitals:   04/09/15 0832  BP: 131/74  Pulse: 95  Temp: 98.1 F (36.7 C)  Resp: 18    Physical Exam: Constitutional: Patient appears well-developed and well-nourished. No distress. Obese HENT: Normocephalic, atraumatic, External right and left ear normal. Oropharynx is clear and moist. She is adentous Eyes: Conjunctivae and EOM are normal. PERRLA, no scleral icterus. Neck: Normal ROM. Neck supple. No lymphadenopathy, No thyromegaly. CVS: RRR, S1/S2 +, no murmurs, no gallops, no rubs Pulmonary: Effort and breath sounds normal, no stridor, rhonchi, wheezes, rales.  Abdominal: Soft. Normoactive BS,, no distension, tenderness, rebound or guarding.  Musculoskeletal: Normal range of motion. and no tenderness. 3+ pedal edema. Neuro: Alert.Normal muscle tone coordination. Non-focal Skin: Skin is warm and dry. No rash noted. Not diaphoretic. No erythema. No pallor. Psychiatric: Normal mood and affect. Behavior, judgment, thought content normal.  Lab Results  Component Value Date   WBC 8.6 03/30/2015   HGB 9.9* 03/30/2015   HCT 31.4* 03/30/2015   MCV 90.8 03/30/2015   PLT 203 03/30/2015   Lab Results  Component Value Date   CREATININE 1.25* 03/30/2015   BUN 8 03/30/2015   NA 135 03/30/2015   K 2.9* 03/30/2015   CL 95* 03/30/2015   CO2 30 03/30/2015    No results found for: HGBA1C Lipid Panel  No results found for: CHOL, TRIG, HDL, CHOLHDL, VLDL, LDLCALC     Assessment and plan:    Visit to establish care -review of available information from  patient -CBC -lipid panel, -HIV -Hep C -Have requested records from previous providers -return in one month for discussion of health maintenance issues.  Diabetes -Cmet with GFR -A!C -Continue current medictions  Hypothyroidism -TSH -continue current medication.      The patient was given clear instructions to go to ER or return to medical center if symptoms don't improve, worsen or new problems develop. The patient verbalized understanding. The patient was told to call to get lab results if they haven't heard anything in the next week.       Henrietta Hoover, MSN, FNP-BC   04/09/2015, 8:51 AM

## 2015-04-10 ENCOUNTER — Telehealth: Payer: Self-pay

## 2015-04-10 LAB — COMPLETE METABOLIC PANEL WITH GFR
ALBUMIN: 4.1 g/dL (ref 3.6–5.1)
ALK PHOS: 76 U/L (ref 33–130)
ALT: 33 U/L — ABNORMAL HIGH (ref 6–29)
AST: 40 U/L — ABNORMAL HIGH (ref 10–35)
BILIRUBIN TOTAL: 0.7 mg/dL (ref 0.2–1.2)
BUN: 14 mg/dL (ref 7–25)
CALCIUM: 9.4 mg/dL (ref 8.6–10.4)
CO2: 34 mmol/L — ABNORMAL HIGH (ref 20–31)
Chloride: 88 mmol/L — ABNORMAL LOW (ref 98–110)
Creat: 1.24 mg/dL — ABNORMAL HIGH (ref 0.50–1.05)
GFR, EST NON AFRICAN AMERICAN: 50 mL/min — AB (ref >=60)
GFR, Est African American: 58 mL/min — ABNORMAL LOW (ref >=60)
GLUCOSE: 94 mg/dL (ref 65–99)
POTASSIUM: 3 mmol/L — AB (ref 3.5–5.3)
SODIUM: 138 mmol/L (ref 135–146)
TOTAL PROTEIN: 7.2 g/dL (ref 6.1–8.1)

## 2015-04-10 LAB — CBC WITH DIFFERENTIAL/PLATELET
BASOS PCT: 0 % (ref 0–1)
Basophils Absolute: 0 10*3/uL (ref 0.0–0.1)
EOS ABS: 0.2 10*3/uL (ref 0.0–0.7)
Eosinophils Relative: 2 % (ref 0–5)
HCT: 35.6 % — ABNORMAL LOW (ref 36.0–46.0)
Hemoglobin: 11.4 g/dL — ABNORMAL LOW (ref 12.0–15.0)
Lymphocytes Relative: 27 % (ref 12–46)
Lymphs Abs: 2.3 10*3/uL (ref 0.7–4.0)
MCH: 27.8 pg (ref 26.0–34.0)
MCHC: 32 g/dL (ref 30.0–36.0)
MCV: 86.8 fL (ref 78.0–100.0)
MONO ABS: 0.5 10*3/uL (ref 0.1–1.0)
MONOS PCT: 6 % (ref 3–12)
MPV: 9.8 fL (ref 8.6–12.4)
NEUTROS ABS: 5.6 10*3/uL (ref 1.7–7.7)
NEUTROS PCT: 65 % (ref 43–77)
PLATELETS: 229 10*3/uL (ref 150–400)
RBC: 4.1 MIL/uL (ref 3.87–5.11)
RDW: 16.7 % — ABNORMAL HIGH (ref 11.5–15.5)
WBC: 8.6 10*3/uL (ref 4.0–10.5)

## 2015-04-10 LAB — LIPID PANEL
CHOLESTEROL: 236 mg/dL — AB (ref 125–200)
HDL: 35 mg/dL — ABNORMAL LOW (ref >=46)
LDL Cholesterol: 142 mg/dL — ABNORMAL HIGH (ref <130)
TRIGLYCERIDES: 295 mg/dL — AB (ref <150)
Total CHOL/HDL Ratio: 6.7 Ratio — ABNORMAL HIGH (ref <=5.0)
VLDL: 59 mg/dL — ABNORMAL HIGH (ref <30)

## 2015-04-10 LAB — TSH: TSH: 2.403 u[IU]/mL (ref 0.350–4.500)

## 2015-04-10 LAB — HIV ANTIBODY (ROUTINE TESTING W REFLEX): HIV: NONREACTIVE

## 2015-04-10 LAB — HEPATITIS C ANTIBODY: HCV AB: NEGATIVE

## 2015-04-10 NOTE — Telephone Encounter (Signed)
-----   Message from Henrietta Hoover, NP sent at 04/10/2015  7:38 AM EDT ----- Please confirm if she has been taking her potassium and cholesterol medication. Her potassium is low and her cholesterol is high

## 2015-04-10 NOTE — Telephone Encounter (Signed)
Left message regarding lab work. Thanks!

## 2015-05-02 ENCOUNTER — Other Ambulatory Visit: Payer: Self-pay | Admitting: Family Medicine

## 2015-05-02 MED ORDER — METOLAZONE 5 MG PO TABS
5.0000 mg | ORAL_TABLET | Freq: Every day | ORAL | Status: DC
Start: 2015-05-02 — End: 2015-11-27

## 2015-05-02 MED ORDER — OMEPRAZOLE 40 MG PO CPDR: 40.0000 mg | DELAYED_RELEASE_CAPSULE | Freq: Every day | ORAL | Status: DC

## 2015-05-02 MED ORDER — LINAGLIPTIN 5 MG PO TABS: 5.0000 mg | ORAL_TABLET | Freq: Every day | ORAL | Status: DC

## 2015-05-02 MED ORDER — MELOXICAM 15 MG PO TABS: 15.0000 mg | ORAL_TABLET | Freq: Every day | ORAL | Status: DC

## 2015-05-02 NOTE — Telephone Encounter (Signed)
Refills have been sent into [facility]. Thanks!

## 2015-05-13 ENCOUNTER — Encounter: Payer: Self-pay | Admitting: Family Medicine

## 2015-05-13 ENCOUNTER — Ambulatory Visit (INDEPENDENT_AMBULATORY_CARE_PROVIDER_SITE_OTHER): Payer: Medicaid Other | Admitting: Family Medicine

## 2015-05-13 VITALS — BP 122/65 | HR 97 | Temp 99.1°F | Resp 20 | Wt 299.0 lb

## 2015-05-13 DIAGNOSIS — N879 Dysplasia of cervix uteri, unspecified: Secondary | ICD-10-CM

## 2015-05-13 DIAGNOSIS — E118 Type 2 diabetes mellitus with unspecified complications: Secondary | ICD-10-CM | POA: Diagnosis not present

## 2015-05-13 DIAGNOSIS — R87619 Unspecified abnormal cytological findings in specimens from cervix uteri: Secondary | ICD-10-CM

## 2015-05-13 DIAGNOSIS — Z0000 Encounter for general adult medical examination without abnormal findings: Secondary | ICD-10-CM | POA: Diagnosis not present

## 2015-05-13 LAB — COMPLETE METABOLIC PANEL WITH GFR
ALBUMIN: 4 g/dL (ref 3.6–5.1)
ALK PHOS: 76 U/L (ref 33–130)
ALT: 32 U/L — AB (ref 6–29)
AST: 53 U/L — ABNORMAL HIGH (ref 10–35)
BILIRUBIN TOTAL: 0.7 mg/dL (ref 0.2–1.2)
BUN: 14 mg/dL (ref 7–25)
CO2: 31 mmol/L (ref 20–31)
CREATININE: 0.99 mg/dL (ref 0.50–1.05)
Calcium: 8.9 mg/dL (ref 8.6–10.4)
Chloride: 93 mmol/L — ABNORMAL LOW (ref 98–110)
GFR, EST AFRICAN AMERICAN: 76 mL/min (ref >=60)
GFR, EST NON AFRICAN AMERICAN: 66 mL/min (ref >=60)
Glucose, Bld: 109 mg/dL — ABNORMAL HIGH (ref 65–99)
Potassium: 3.1 mmol/L — ABNORMAL LOW (ref 3.5–5.3)
Sodium: 139 mmol/L (ref 135–146)
TOTAL PROTEIN: 7.1 g/dL (ref 6.1–8.1)

## 2015-05-13 NOTE — Patient Instructions (Signed)
Constipation Constipation is when a person has fewer than three bowel movements a week, has difficulty having a bowel movement, or has stools that are dry, hard, or larger than normal. As people grow older, constipation is more common. If you try to fix constipation with medicines that make you have a bowel movement (laxatives), the problem may get worse. Long-term laxative use may cause the muscles of the colon to become weak. A low-fiber diet, not taking in enough fluids, and taking certain medicines may make constipation worse.  CAUSES   Certain medicines, such as antidepressants, pain medicine, iron supplements, antacids, and water pills.   Certain diseases, such as diabetes, irritable bowel syndrome (IBS), thyroid disease, or depression.   Not drinking enough water.   Not eating enough fiber-rich foods.   Stress or travel.   Lack of physical activity or exercise.   Ignoring the urge to have a bowel movement.   Using laxatives too much.  SIGNS AND SYMPTOMS   Having fewer than three bowel movements a week.   Straining to have a bowel movement.   Having stools that are hard, dry, or larger than normal.   Feeling full or bloated.   Pain in the lower abdomen.   Not feeling relief after having a bowel movement.  DIAGNOSIS  Your health care provider will take a medical history and perform a physical exam. Further testing may be done for severe constipation. Some tests may include:  A barium enema X-ray to examine your rectum, colon, and, sometimes, your small intestine.   A sigmoidoscopy to examine your lower colon.   A colonoscopy to examine your entire colon. TREATMENT  Treatment will depend on the severity of your constipation and what is causing it. Some dietary treatments include drinking more fluids and eating more fiber-rich foods. Lifestyle treatments may include regular exercise. If these diet and lifestyle recommendations do not help, your health  care provider may recommend taking over-the-counter laxative medicines to help you have bowel movements. Prescription medicines may be prescribed if over-the-counter medicines do not work.  HOME CARE INSTRUCTIONS   Eat foods that have a lot of fiber, such as fruits, vegetables, whole grains, and beans.  Limit foods high in fat and processed sugars, such as french fries, hamburgers, cookies, candies, and soda.   A fiber supplement may be added to your diet if you cannot get enough fiber from foods.   Drink enough fluids to keep your urine clear or pale yellow.   Exercise regularly or as directed by your health care provider.   Go to the restroom when you have the urge to go. Do not hold it.   Only take over-the-counter or prescription medicines as directed by your health care provider. Do not take other medicines for constipation without talking to your health care provider first.  SEEK IMMEDIATE MEDICAL CARE IF:   You have bright red blood in your stool.   Your constipation lasts for more than 4 days or gets worse.   You have abdominal or rectal pain.   You have thin, pencil-like stools.   You have unexplained weight loss. MAKE SURE YOU:   Understand these instructions.  Will watch your condition.  Will get help right away if you are not doing well or get worse. Document Released: 05/08/2004 Document Revised: 08/15/2013 Document Reviewed: 05/22/2013 [facility] Patient Information 2015 [place], [place]. This information is not intended to replace advice given to you by your health care provider. Make sure you discuss  any questions you have with your health care provider.  Check on whether you had a flu and pneumonia shot We are setting up a referral to GYN and opthalmology.

## 2015-05-13 NOTE — Progress Notes (Signed)
Patient ID: Tabitha Scott, female   DOB: [birth year], 49 y.o.   MRN: [id number]   Tabitha Scott, is a 49 y.o. female  WUJ:[id number]  [id number]  DOB - 06-07-1966  CC:  Chief Complaint  Patient presents with  . Follow-up       HPI: Tabitha Scott is a 49 y.o. female here for follow-up and to address health maintenance needs. She has a history of Diabetes Type II, COPD, hyperlipidemia, thyroid disease. Her current medications are listed in her medication list which has been reviewed.  She is currently in need of a colonoscopy, a diabetic eye exam, an A1C and a urine micral. She was hospital recently in [facility] and had either a flu shot or pneumonia but she is unsure which.   Allergies  Allergen Reactions  . Tetanus Toxoids Swelling   Past Medical History  Diagnosis Date  . Neuropathy   . Diabetes mellitus without complication   . COPD (chronic obstructive pulmonary disease)   . Hyperlipidemia   . Oxygen deficiency     prescribed but doesn't use regularly  . Thyroid disease    Current Outpatient Prescriptions on File Prior to Visit  Medication Sig Dispense Refill  . atorvastatin (LIPITOR) 40 MG tablet Take 1 tablet (40 mg total) by mouth daily. 90 tablet 2  . furosemide (LASIX) 40 MG tablet Take 1 tablet (40 mg total) by mouth daily. 90 tablet 3  . gabapentin (NEURONTIN) 300 MG capsule Take 1 capsule (300 mg total) by mouth 2 (two) times daily. 180 capsule 3  . levothyroxine (SYNTHROID, LEVOTHROID) 25 MCG tablet Take 1 tablet (25 mcg total) by mouth daily before breakfast. 90 tablet 2  . linagliptin (TRADJENTA) 5 MG TABS tablet Take 1 tablet (5 mg total) by mouth daily. 90 tablet 3  . Liraglutide (VICTOZA) 18 MG/3ML SOPN Inject 0.3 mLs (1.8 mg total) into the skin daily. 6 mL prn  . meloxicam (MOBIC) 15 MG tablet Take 1 tablet (15 mg total) by mouth daily. 30 tablet 3  . metolazone (ZAROXOLYN) 5 MG tablet Take 1 tablet (5 mg total) by mouth daily. 30 mins prior to furosemide 90  tablet 3  . omeprazole (PRILOSEC) 40 MG capsule Take 1 capsule (40 mg total) by mouth daily. 90 capsule 3  . potassium chloride (MICRO-K) 10 MEQ CR capsule Take 1 capsule (10 mEq total) by mouth daily. 90 capsule 3  . tiZANidine (ZANAFLEX) 4 MG capsule Take 4 mg by mouth 3 (three) times daily as needed for muscle spasms.    . Fluticasone-Salmeterol (ADVAIR) 250-50 MCG/DOSE AEPB Inhale 1 puff into the lungs 2 (two) times daily.    Marland Kitchen HYDROcodone-acetaminophen (NORCO/VICODIN) 5-325 MG per tablet Take 1-2 tablets by mouth every 4 (four) hours as needed for moderate pain. (Patient not taking: Reported on 04/09/2015) 15 tablet 0   No current facility-administered medications on file prior to visit.   Family History  Problem Relation Age of Onset  . COPD Mother   . Heart disease Father   . Cancer Maternal Grandmother    Social History   Social History  . Marital Status: Single    Spouse Name: N/A  . Number of Children: N/A  . Years of Education: N/A   Occupational History  . Not on file.   Social History Main Topics  . Smoking status: Former Smoker    Quit date: 01/11/2015  . Smokeless tobacco: Not on file  . Alcohol Use: No  . Drug Use: No  .  Sexual Activity: No   Other Topics Concern  . Not on file   Social History Narrative    Review of Systems: Constitutional: Negative for fever, chills, appetite change, weight loss,  Fatigue. Skin: Negative for rashes or lesions of concern. HENT: Negative for ear pain, ear discharge.nose bleeds Eyes: Negative for pain, discharge, redness, itching and visual disturbance. Neck: Negative for pain, stiffness Respiratory: Negative for cough, shortness of breath,   Cardiovascular: Negative for chest pain, palpitations and leg swelling. Gastrointestinal: Negative for abdominal pain, nausea, vomiting, diarrhea, constipations Genitourinary: Negative for dysuria, urgency, frequency, hematuria,  Musculoskeletal: Negative for back pain, joint pain,  joint  swelling, and gait problem.Negative for weakness. Neurological: Negative for dizziness, tremors, seizures, syncope,   light-headedness, numbness and headaches.  Hematological: Negative for easy bruising or bleeding Psychiatric/Behavioral: Negative for depression, anxiety, decreased concentration, confusion   Objective:   Filed Vitals:   05/13/15 1113  BP: 122/65  Pulse: 97  Temp: 99.1 F (37.3 C)  Resp: 20    Physical Exam: Constitutional: Patient appears well-developed and well-nourished. No distress. HENT: Normocephalic, atraumatic, External right and left ear normal. Oropharynx is clear and moist.  Eyes: Conjunctivae and EOM are normal. PERRLA, no scleral icterus. Neck: Normal ROM. Neck supple. No lymphadenopathy, No thyromegaly. CVS: RRR, S1/S2 +, no murmurs, no gallops, no rubs Pulmonary: Effort and breath sounds normal, no stridor, rhonchi, wheezes, rales.  Abdominal: Soft. Normoactive BS,, no distension, tenderness, rebound or guarding.  Musculoskeletal: Normal range of motion. No edema and no tenderness.  Neuro: Alert.Normal muscle tone coordination. Non-focal Skin: Skin is warm and dry. No rash noted. Not diaphoretic. No erythema. No pallor. Psychiatric: Normal mood and affect. Behavior, judgment, thought content normal.  Lab Results  Component Value Date   WBC 8.6 04/09/2015   HGB 11.4* 04/09/2015   HCT 35.6* 04/09/2015   MCV 86.8 04/09/2015   PLT 229 04/09/2015   Lab Results  Component Value Date   CREATININE 1.24* 04/09/2015   BUN 14 04/09/2015   NA 138 04/09/2015   K 3.0* 04/09/2015   CL 88* 04/09/2015   CO2 34* 04/09/2015    No results found for: HGBA1C Lipid Panel     Component Value Date/Time   CHOL 236* 04/09/2015 0927   TRIG 295* 04/09/2015 0927   HDL 35* 04/09/2015 0927   CHOLHDL 6.7* 04/09/2015 0927   VLDL 59* 04/09/2015 0927   LDLCALC 142* 04/09/2015 0927       Assessment and plan:   1. Type 2 diabetes mellitus with  complication  - COMPLETE METABOLIC PANEL WITH GFR - Hemoglobin A1c - Microalbumin, urine  2. Health care maintenance  - Ambulatory referral to Gastroenterology - Ambulatory referral to Ophthalmology -I have asked to to check and let us know whether she needs a pneumonia or flu vaccine.   3. Abnormal cells of cervix  - Ambulatory referral to Gynecology  4. Constipation -I have provided her with printed information on constipation   Return in about 3 months (around 08/12/2015).  The patient was given clear instructions to go to ER or return to medical center if symptoms don't improve, worsen or new problems develop. The patient verbalized understanding.    Henrietta Hoover FNP  05/13/2015, 1:03 PM

## 2015-05-14 ENCOUNTER — Telehealth: Payer: Self-pay | Admitting: Family Medicine

## 2015-05-14 LAB — MICROALBUMIN, URINE: Microalb, Ur: 0.6 mg/dL (ref <2.0)

## 2015-05-14 NOTE — Telephone Encounter (Signed)
Please advise. Patient last seen 05/13/2015. Thanks!

## 2015-05-14 NOTE — Telephone Encounter (Signed)
Patient stated Tabitha Scott forgot to call in RX for Tramadol at last visit.

## 2015-05-17 ENCOUNTER — Other Ambulatory Visit: Payer: Self-pay | Admitting: Family Medicine

## 2015-05-17 MED ORDER — TRAMADOL HCL 50 MG PO TABS: 50.0000 mg | ORAL_TABLET | Freq: Four times a day (QID) | ORAL | Status: DC | PRN

## 2015-05-20 ENCOUNTER — Telehealth: Payer: Self-pay

## 2015-05-20 ENCOUNTER — Telehealth: Payer: Self-pay | Admitting: Family Medicine

## 2015-05-20 ENCOUNTER — Other Ambulatory Visit: Payer: Self-pay | Admitting: Family Medicine

## 2015-05-20 NOTE — Telephone Encounter (Signed)
Patient called to request that another opthalmalogy referral be made to a practice in [place] as the patient has transportation challenges.

## 2015-05-20 NOTE — Telephone Encounter (Signed)
-----   Message from Henrietta Hoover, NP sent at 05/16/2015  3:27 PM EDT ----- Potassium still low. Take your potassium pill twice a day.

## 2015-05-20 NOTE — Telephone Encounter (Signed)
Left message for patient to advise of need to take potassium pill twice daily. Thanks!

## 2015-05-20 NOTE — Telephone Encounter (Signed)
Left message for patient to call and schedule appointment where ever she chose to go since she has insurance and if any questions to call me back. Thanks!

## 2015-06-08 ENCOUNTER — Emergency Department (HOSPITAL_COMMUNITY)
Admission: EM | Admit: 2015-06-08 | Discharge: 2015-06-08 | Disposition: A | Discharge status: Home / Self Care | Payer: Medicaid Other | Attending: Emergency Medicine | Admitting: Emergency Medicine

## 2015-06-08 ENCOUNTER — Encounter (HOSPITAL_COMMUNITY): Payer: Self-pay | Admitting: Emergency Medicine

## 2015-06-08 ENCOUNTER — Emergency Department (HOSPITAL_COMMUNITY): Payer: Medicaid Other

## 2015-06-08 DIAGNOSIS — M25532 Pain in left wrist: Secondary | ICD-10-CM

## 2015-06-08 DIAGNOSIS — G629 Polyneuropathy, unspecified: Secondary | ICD-10-CM | POA: Insufficient documentation

## 2015-06-08 DIAGNOSIS — Z79899 Other long term (current) drug therapy: Secondary | ICD-10-CM | POA: Insufficient documentation

## 2015-06-08 DIAGNOSIS — E119 Type 2 diabetes mellitus without complications: Secondary | ICD-10-CM | POA: Diagnosis not present

## 2015-06-08 DIAGNOSIS — Z7951 Long term (current) use of inhaled steroids: Secondary | ICD-10-CM | POA: Insufficient documentation

## 2015-06-08 DIAGNOSIS — J449 Chronic obstructive pulmonary disease, unspecified: Secondary | ICD-10-CM | POA: Insufficient documentation

## 2015-06-08 DIAGNOSIS — E079 Disorder of thyroid, unspecified: Secondary | ICD-10-CM | POA: Insufficient documentation

## 2015-06-08 DIAGNOSIS — M79642 Pain in left hand: Secondary | ICD-10-CM

## 2015-06-08 DIAGNOSIS — Z87891 Personal history of nicotine dependence: Secondary | ICD-10-CM | POA: Insufficient documentation

## 2015-06-08 DIAGNOSIS — E785 Hyperlipidemia, unspecified: Secondary | ICD-10-CM | POA: Diagnosis not present

## 2015-06-08 DIAGNOSIS — Z791 Long term (current) use of non-steroidal anti-inflammatories (NSAID): Secondary | ICD-10-CM | POA: Diagnosis not present

## 2015-06-08 MED ORDER — OXYCODONE-ACETAMINOPHEN 5-325 MG PO TABS
1.0000 | ORAL_TABLET | Freq: Once | ORAL | Status: AC
  Administered 2015-06-08: 1 via ORAL
  Filled 2015-06-08: qty 1

## 2015-06-08 MED ORDER — TRAMADOL HCL 50 MG PO TABS: 50.0000 mg | ORAL_TABLET | Freq: Four times a day (QID) | ORAL | Status: DC | PRN

## 2015-06-08 NOTE — ED Notes (Signed)
Pt comes in with left hand pain that started 3 days ago, no injury reported.  Left hand swelling noted at wrist area, burning pain spreading to forearm Unable to bend with weak hand grasp. Hot compress applied with ice at home, Ibuprofen not effective

## 2015-06-08 NOTE — ED Provider Notes (Signed)
CSN: [id number]     Arrival date & time 06/08/15  1115 History  By signing my name below, I, Jarvis Morgan, attest that this documentation has been prepared under the direction and in the presence of Sharilyn Sites, PA-C Electronically Signed: Jarvis Morgan, ED Scribe. 06/08/2015. 1:30 PM.    Chief Complaint  Patient presents with  . Hand Pain   The history is provided by the patient. No language interpreter was used.    HPI Comments: Tabitha Scott is a 49 y.o. female with a h/o neuropathy and DM who presents to the Emergency Department complaining of constant, moderate, left hand pain at the center of her left palm and base of left thumb onset 3 days ago. She states there is a shooting, burning pain into her left forearm. She endorses associated mild swelling noted to her left wrist. Pt notes she has a h/o neuropathy and states this feels different from her typical neuropathy symptoms. She applied a hot compress and ice at home along with Ibuprofen with no significant relief. She has not had any Ibuprofen today. Pt states the pain is exacerbated with movement or trying to grip with left hand. She is right hand dominant. She denies any h/o gout. No fever, chills.  Pt denies any known injury or trauma to the area. She denies any numbness or weakness in her left fingers.    Past Medical History  Diagnosis Date  . Neuropathy (HCC)   . Diabetes mellitus without complication (HCC)   . COPD (chronic obstructive pulmonary disease) (HCC)   . Hyperlipidemia   . Oxygen deficiency     prescribed but doesn't use regularly  . Thyroid disease    Past Surgical History  Procedure Laterality Date  . Cholecystectomy    . Knee surgery      tissue removed, 2011?  Marland Kitchen Arm surgery  2009    nerver repair to both arms   . Carpal tunnel release  2009   Family History  Problem Relation Age of Onset  . COPD Mother   . Heart disease Father   . Cancer Maternal Grandmother    Social History  Substance Use  Topics  . Smoking status: Former Smoker    Quit date: 01/11/2015  . Smokeless tobacco: None  . Alcohol Use: No   OB History    No data available     Review of Systems  Musculoskeletal: Positive for joint swelling and arthralgias.  Neurological: Negative for weakness and numbness.  All other systems reviewed and are negative.     Allergies  Tetanus toxoids  Home Medications   Prior to Admission medications   Medication Sig Start Date End Date Taking? Authorizing Provider  atorvastatin (LIPITOR) 40 MG tablet Take 1 tablet (40 mg total) by mouth daily. 04/09/15  Yes Henrietta Hoover, NP  furosemide (LASIX) 40 MG tablet Take 1 tablet (40 mg total) by mouth daily. 04/09/15  Yes Henrietta Hoover, NP  gabapentin (NEURONTIN) 300 MG capsule Take 1 capsule (300 mg total) by mouth 2 (two) times daily. 04/09/15  Yes Henrietta Hoover, NP  levothyroxine (SYNTHROID, LEVOTHROID) 25 MCG tablet Take 1 tablet (25 mcg total) by mouth daily before breakfast. 04/09/15  Yes Henrietta Hoover, NP  linagliptin (TRADJENTA) 5 MG TABS tablet Take 1 tablet (5 mg total) by mouth daily. 05/02/15  Yes Henrietta Hoover, NP  Liraglutide (VICTOZA) 18 MG/3ML SOPN Inject 0.3 mLs (1.8 mg total) into the skin daily. 04/09/15  Yes Jaclyn Shaggy  Bernhardt, NP  meloxicam (MOBIC) 15 MG tablet Take 1 tablet (15 mg total) by mouth daily. 05/02/15  Yes Henrietta HooverLinda C Bernhardt, NP  metolazone (ZAROXOLYN) 5 MG tablet Take 1 tablet (5 mg total) by mouth daily. 30 mins prior to furosemide 05/02/15  Yes Henrietta HooverLinda C Bernhardt, NP  omeprazole (PRILOSEC) 40 MG capsule Take 1 capsule (40 mg total) by mouth daily. 05/02/15  Yes Henrietta HooverLinda C Bernhardt, NP  potassium chloride (MICRO-K) 10 MEQ CR capsule Take 1 capsule (10 mEq total) by mouth daily. 04/09/15  Yes Henrietta HooverLinda C Bernhardt, NP  traMADol (ULTRAM) 50 MG tablet Take 1 tablet (50 mg total) by mouth every 6 (six) hours as needed. 05/17/15  Yes Henrietta HooverLinda C Bernhardt, NP  Fluticasone-Salmeterol (ADVAIR) 250-50 MCG/DOSE  AEPB Inhale 1 puff into the lungs 2 (two) times daily.    Historical Provider, MD  HYDROcodone-acetaminophen (NORCO/VICODIN) 5-325 MG per tablet Take 1-2 tablets by mouth every 4 (four) hours as needed for moderate pain. Patient not taking: Reported on 04/09/2015 03/30/15   Gilda Creasehristopher J Pollina, MD  tiZANidine (ZANAFLEX) 4 MG capsule Take 4 mg by mouth 3 (three) times daily as needed for muscle spasms.    Historical Provider, MD   Triage Vitals: BP 119/58 mmHg  Pulse 97  Temp(Src) 97.5 F (36.4 C) (Oral)  Resp 16  Ht 5\' 4"  (1.626 m)  Wt 301 lb 1.6 oz (136.578 kg)  BMI 51.66 kg/m2  SpO2 98%  Physical Exam  Constitutional: She is oriented to person, place, and time. She appears well-developed and well-nourished. No distress.  HENT:  Head: Normocephalic and atraumatic.  Mouth/Throat: Oropharynx is clear and moist.  Eyes: Conjunctivae and EOM are normal. Pupils are equal, round, and reactive to light.  Neck: Normal range of motion. Neck supple.  Cardiovascular: Normal rate, regular rhythm and normal heart sounds.   Pulmonary/Chest: Effort normal and breath sounds normal. No respiratory distress. She has no wheezes.  Musculoskeletal: Normal range of motion.  Diffuse TTP of left volar wrist extending into left thumb and middle of left palm; full ROM maintained but with some pain noted; no acute deformities or swelling noted; no skin changes or warmth to touch; hand is NVI  Neurological: She is alert and oriented to person, place, and time.  Skin: Skin is warm and dry. She is not diaphoretic.  Psychiatric: She has a normal mood and affect.  Nursing note and vitals reviewed.   ED Course  ORTHOPEDIC INJURY TREATMENT Date/Time: 06/08/2015 2:06 PM Performed by: Garlon HatchetSANDERS, Arrabella Westerman M Authorized by: Garlon HatchetSANDERS, Garan Frappier M Consent: Verbal consent obtained. Risks and benefits: risks, benefits and alternatives were discussed Consent given by: patient Patient understanding: patient states understanding of  the procedure being performed Required items: required blood products, implants, devices, and special equipment available Patient identity confirmed: verbally with patient Injury location: wrist Location details: left wrist Injury type: soft tissue Pre-procedure neurovascular assessment: neurovascularly intact Immobilization: splint Splint type: thumb spica Supplies used: aluminum splint Post-procedure neurovascular assessment: post-procedure neurovascularly intact Patient tolerance: Patient tolerated the procedure well with no immediate complications   (including critical care time)  DIAGNOSTIC STUDIES: Oxygen Saturation is 98% on RA, normal by my interpretation.    COORDINATION OF CARE:  12:32 PM- Will order imaging of left hand and left wrist along with Percocet 5-325mg .  Pt advised of plan for treatment and pt agrees.    Labs Review Labs Reviewed - No data to display  Imaging Review Dg Wrist Complete Left  06/08/2015  CLINICAL DATA:  Pt began having left palm pain about 3 days ago while washing dishes- no injury- Pt just noticed that it starting hurting. Pain has worsened and is now radiating up forearm. A palpable knot is felt at the base of her thumb with b.*comment was truncated* EXAM: LEFT WRIST - COMPLETE 3+ VIEW COMPARISON:  None. FINDINGS: No acute fracture or dislocation. Scaphoid intact. Joint spaces maintained. IMPRESSION: No acute osseous abnormality. Electronically Signed   By: Jeronimo Greaves M.D.   On: 06/08/2015 13:22   Dg Hand Complete Left  06/08/2015  CLINICAL DATA:  49 year old female with left-sided palm pain 3 days ago while washing dishes. Pain now worsening and radiating up into the left forearm. EXAM: LEFT HAND - COMPLETE 3+ VIEW COMPARISON:  No priors. FINDINGS: Multiple views of the left hand demonstrate no acute displaced fracture, subluxation, dislocation, or soft tissue abnormality. IMPRESSION: No acute radiographic abnormality of the left hand.  Electronically Signed   By: Trudie Reed M.D.   On: 06/08/2015 13:20   I have personally reviewed and evaluated these images  as part of my medical decision-making.   EKG Interpretation None      MDM   Final diagnoses:  Left wrist pain  Left hand pain   49 year old female here with left hand and wrist pain, onset 3 days ago. No injury noted.  Patient does have history of neuropathy but states this feels different. Pain localized to the lower left wrist extending into left thumb and middle of left palm. No acute deformities or swelling noted. No clinical signs of infection. Patient has had prior carpal tunnel repair of bilateral wrists in the past. X-rays were obtained which are negative for acute findings. Exact etiology of patient's symptoms unknown, possible tendinitis versus exacerbation of her baseline neuropathy. Patient was placed in wrist splint for comfort. Rx tramadol. She will be referred to hand surgery for any new or worsening symptoms.  FU with PCP in interim.  Discussed plan with patient, he/she acknowledged understanding and agreed with plan of care.  Return precautions given for new or worsening symptoms.   I personally performed the services described in this documentation, which was scribed in my presence. The recorded information has been reviewed and is accurate.   Garlon Hatchet, PA-C 06/08/15 1406  Margarita Grizzle, MD 06/10/15 1320

## 2015-06-08 NOTE — Discharge Instructions (Signed)
Take the prescribed medication as directed.  Wear brace to help stabilize wrist. Follow-up with your primary care physician.  If no improvement in the next few days, may need referral to hand surgery. Contact information for hand specialist provided. Return to the ED for new or worsening symptoms.

## 2015-06-21 ENCOUNTER — Other Ambulatory Visit: Payer: Self-pay

## 2015-06-21 ENCOUNTER — Telehealth: Payer: Self-pay | Admitting: Family Medicine

## 2015-06-21 MED ORDER — POTASSIUM CHLORIDE ER 10 MEQ PO CPCR: 20.0000 meq | ORAL_CAPSULE | Freq: Every day | ORAL | Status: DC

## 2015-06-21 NOTE — Telephone Encounter (Signed)
Please call patient regarding Potassium RX

## 2015-06-21 NOTE — Telephone Encounter (Signed)
New potassium rx sent to pharmacy. Thanks!

## 2015-07-09 ENCOUNTER — Ambulatory Visit: Payer: Medicaid Other | Admitting: Family Medicine

## 2015-07-29 DIAGNOSIS — M1812 Unilateral primary osteoarthritis of first carpometacarpal joint, left hand: Secondary | ICD-10-CM | POA: Insufficient documentation

## 2015-08-12 ENCOUNTER — Ambulatory Visit: Payer: Medicaid Other | Admitting: Family Medicine

## 2015-08-15 ENCOUNTER — Ambulatory Visit (INDEPENDENT_AMBULATORY_CARE_PROVIDER_SITE_OTHER): Payer: Medicaid Other | Admitting: Family Medicine

## 2015-08-15 VITALS — BP 144/69 | HR 91 | Temp 98.0°F | Resp 16 | Ht 64.5 in | Wt 302.0 lb

## 2015-08-15 DIAGNOSIS — E138 Other specified diabetes mellitus with unspecified complications: Secondary | ICD-10-CM

## 2015-08-15 LAB — COMPLETE METABOLIC PANEL WITH GFR
ALK PHOS: 78 U/L (ref 33–130)
ALT: 23 U/L (ref 6–29)
AST: 35 U/L (ref 10–35)
Albumin: 3.6 g/dL (ref 3.6–5.1)
BILIRUBIN TOTAL: 0.8 mg/dL (ref 0.2–1.2)
BUN: 11 mg/dL (ref 7–25)
CO2: 32 mmol/L — AB (ref 20–31)
Calcium: 8.8 mg/dL (ref 8.6–10.4)
Chloride: 98 mmol/L (ref 98–110)
Creat: 1.05 mg/dL (ref 0.50–1.05)
GFR, EST AFRICAN AMERICAN: 71 mL/min (ref >=60)
GFR, EST NON AFRICAN AMERICAN: 62 mL/min (ref >=60)
GLUCOSE: 93 mg/dL (ref 65–99)
POTASSIUM: 3.5 mmol/L (ref 3.5–5.3)
SODIUM: 141 mmol/L (ref 135–146)
TOTAL PROTEIN: 7.2 g/dL (ref 6.1–8.1)

## 2015-08-15 LAB — LIPID PANEL
CHOL/HDL RATIO: 4.3 ratio (ref <=5.0)
CHOLESTEROL: 147 mg/dL (ref 125–200)
HDL: 34 mg/dL — AB (ref >=46)
LDL Cholesterol: 61 mg/dL (ref <130)
TRIGLYCERIDES: 260 mg/dL — AB (ref <150)
VLDL: 52 mg/dL — AB (ref <30)

## 2015-08-15 LAB — CBC WITH DIFFERENTIAL/PLATELET
BASOS ABS: 0 10*3/uL (ref 0.0–0.1)
Basophils Relative: 0 % (ref 0–1)
EOS ABS: 0.2 10*3/uL (ref 0.0–0.7)
Eosinophils Relative: 2 % (ref 0–5)
HCT: 31.4 % — ABNORMAL LOW (ref 36.0–46.0)
HEMOGLOBIN: 10 g/dL — AB (ref 12.0–15.0)
Lymphocytes Relative: 31 % (ref 12–46)
Lymphs Abs: 2.5 10*3/uL (ref 0.7–4.0)
MCH: 28.2 pg (ref 26.0–34.0)
MCHC: 31.8 g/dL (ref 30.0–36.0)
MCV: 88.5 fL (ref 78.0–100.0)
MPV: 9.3 fL (ref 8.6–12.4)
Monocytes Absolute: 0.4 10*3/uL (ref 0.1–1.0)
Monocytes Relative: 5 % (ref 3–12)
NEUTROS ABS: 5 10*3/uL (ref 1.7–7.7)
NEUTROS PCT: 62 % (ref 43–77)
PLATELETS: 200 10*3/uL (ref 150–400)
RBC: 3.55 MIL/uL — ABNORMAL LOW (ref 3.87–5.11)
RDW: 17.1 % — ABNORMAL HIGH (ref 11.5–15.5)
WBC: 8 10*3/uL (ref 4.0–10.5)

## 2015-08-15 MED ORDER — LIRAGLUTIDE 18 MG/3ML ~~LOC~~ SOPN: 1.8000 mg | PEN_INJECTOR | Freq: Every day | SUBCUTANEOUS | Status: DC

## 2015-08-15 NOTE — Patient Instructions (Signed)
Continue current medications Let me know if you need refills We will let you know if your labs show a need for a change. Come back in 3 months.

## 2015-08-15 NOTE — Progress Notes (Signed)
Patient ID: Tabitha Scott, female   DOB: 19-Sep-1965, 49 y.o.   MRN: [id number]   Tabitha Scott, is a 49 y.o. female  [id number]  [id number]  DOB - [birth year]  CC:  Chief Complaint  Patient presents with  . Follow-up       HPI: Tabitha Scott is a 49 y.o. female for follow-up diabetes. She is currently on Victoza as listed in her medication list for her diabetes. She reports doing only "fair" with diet and exercise.  She does report taking her medication regularly. She also has a history of COPD, neuropathy, thyroid disease and hyperlipidemia. The only medication she needs today is her Victoza.  Allergies  Allergen Reactions  . Tetanus Toxoids Swelling   Past Medical History  Diagnosis Date  . Neuropathy (HCC)   . Diabetes mellitus without complication (HCC)   . COPD (chronic obstructive pulmonary disease) (HCC)   . Hyperlipidemia   . Oxygen deficiency     prescribed but doesn't use regularly  . Thyroid disease    Current Outpatient Prescriptions on File Prior to Visit  Medication Sig Dispense Refill  . atorvastatin (LIPITOR) 40 MG tablet Take 1 tablet (40 mg total) by mouth daily. 90 tablet 2  . furosemide (LASIX) 40 MG tablet Take 1 tablet (40 mg total) by mouth daily. 90 tablet 3  . gabapentin (NEURONTIN) 300 MG capsule Take 1 capsule (300 mg total) by mouth 2 (two) times daily. 180 capsule 3  . levothyroxine (SYNTHROID, LEVOTHROID) 25 MCG tablet Take 1 tablet (25 mcg total) by mouth daily before breakfast. 90 tablet 2  . linagliptin (TRADJENTA) 5 MG TABS tablet Take 1 tablet (5 mg total) by mouth daily. 90 tablet 3  . meloxicam (MOBIC) 15 MG tablet Take 1 tablet (15 mg total) by mouth daily. 30 tablet 3  . metolazone (ZAROXOLYN) 5 MG tablet Take 1 tablet (5 mg total) by mouth daily. 30 mins prior to furosemide 90 tablet 3  . omeprazole (PRILOSEC) 40 MG capsule Take 1 capsule (40 mg total) by mouth daily. 90 capsule 3  . potassium chloride (MICRO-K) 10 MEQ CR capsule Take 2  capsules (20 mEq total) by mouth daily. 60 capsule 3  . tiZANidine (ZANAFLEX) 4 MG capsule Take 4 mg by mouth 3 (three) times daily as needed for muscle spasms.    . traMADol (ULTRAM) 50 MG tablet Take 1 tablet (50 mg total) by mouth every 6 (six) hours as needed. 15 tablet 0  . Fluticasone-Salmeterol (ADVAIR) 250-50 MCG/DOSE AEPB Inhale 1 puff into the lungs 2 (two) times daily. Reported on 08/15/2015    . HYDROcodone-acetaminophen (NORCO/VICODIN) 5-325 MG per tablet Take 1-2 tablets by mouth every 4 (four) hours as needed for moderate pain. (Patient not taking: Reported on 04/09/2015) 15 tablet 0   No current facility-administered medications on file prior to visit.   Family History  Problem Relation Age of Onset  . COPD Mother   . Heart disease Father   . Cancer Maternal Grandmother    Social History   Social History  . Marital Status: Single    Spouse Name: N/A  . Number of Children: N/A  . Years of Education: N/A   Occupational History  . Not on file.   Social History Main Topics  . Smoking status: Former Smoker    Quit date: 01/11/2015  . Smokeless tobacco: Not on file  . Alcohol Use: No  . Drug Use: No  . Sexual Activity: No   Other Topics Concern  .  Not on file   Social History Narrative    Review of Systems: Constitutional: Negative for fever, chills, appetite change, weight loss,  Fatigue. Skin: Negative for rashes or lesions of concern. HENT: Negative for ear pain, ear discharge.nose bleeds Eyes: Negative for pain, discharge, redness, itching and visual disturbance. Neck: Negative for pain, stiffness Respiratory: Negative for cough, shortness of breath,   Cardiovascular: Negative for chest pain, palpitations and leg swelling. Gastrointestinal: Negative for abdominal pain, nausea, vomiting, diarrhea, constipations Genitourinary: Negative for dysuria, urgency, frequency, hematuria,  Musculoskeletal: Negative for back pain, joint pain, joint  swelling, and gait  problem.Negative for weakness. Neurological: Negative for dizziness, tremors, seizures, syncope,   light-headedness, numbness and headaches.  Hematological: Negative for easy bruising or bleeding Psychiatric/Behavioral: Negative for depression, anxiety, decreased concentration, confusion   Objective:   Filed Vitals:   08/15/15 1035  BP: 144/69  Pulse: 91  Temp: 98 F (36.7 C)  Resp: 16    Physical Exam: Constitutional: Patient appears well-developed and well-nourished. No distress. HENT: Normocephalic, atraumatic, External right and left ear normal. Oropharynx is clear and moist.  Eyes: Conjunctivae and EOM are normal. PERRLA, no scleral icterus. Neck: Normal ROM. Neck supple. No lymphadenopathy, No thyromegaly. CVS: RRR, S1/S2 +, no murmurs, no gallops, no rubs Pulmonary: Effort and breath sounds normal, no stridor, rhonchi, wheezes, rales.  Abdominal: Soft. Normoactive BS,, no distension, tenderness, rebound or guarding.  Musculoskeletal: Normal range of motion. No edema and no tenderness.  Neuro: Alert.Normal muscle tone coordination. Non-focal Skin: Skin is warm and dry. No rash noted. Not diaphoretic. No erythema. No pallor. Psychiatric: Normal mood and affect. Behavior, judgment, thought content normal.  Lab Results  Component Value Date   WBC 8.0 08/15/2015   HGB 10.0* 08/15/2015   HCT 31.4* 08/15/2015   MCV 88.5 08/15/2015   PLT 200 08/15/2015   Lab Results  Component Value Date   CREATININE 1.05 08/15/2015   BUN 11 08/15/2015   NA 141 08/15/2015   K 3.5 08/15/2015   CL 98 08/15/2015   CO2 32* 08/15/2015    No results found for: HGBA1C Lipid Panel     Component Value Date/Time   CHOL 147 08/15/2015 1109   TRIG 260* 08/15/2015 1109   HDL 34* 08/15/2015 1109   CHOLHDL 4.3 08/15/2015 1109   VLDL 52* 08/15/2015 1109   LDLCALC 61 08/15/2015 1109       Assessment and plan:   1. Diabetes mellitus of other type with complication (HCC)  - COMPLETE  METABOLIC PANEL WITH GFR - CBC w/Diff - Lipid panel - Liraglutide (VICTOZA) 18 MG/3ML SOPN; Inject 0.3 mLs (1.8 mg total) into the skin daily.  Dispense: 6 mL; Refill: prn   Return in about 3 months (around 11/13/2015) for Diabetes, A1C.  The patient was given clear instructions to go to ER or return to medical center if symptoms don't improve, worsen or new problems develop. The patient verbalized understanding.    Henrietta HooverLinda C Jerod Mcquain FNP  08/19/2015, 5:10 PM

## 2015-08-22 ENCOUNTER — Other Ambulatory Visit (HOSPITAL_COMMUNITY): Payer: Self-pay | Admitting: Family Medicine

## 2015-08-22 DIAGNOSIS — D649 Anemia, unspecified: Secondary | ICD-10-CM

## 2015-08-22 DIAGNOSIS — E138 Other specified diabetes mellitus with unspecified complications: Secondary | ICD-10-CM

## 2015-08-22 MED ORDER — FERROUS SULFATE 325 (65 FE) MG PO TABS: 325.0000 mg | ORAL_TABLET | Freq: Every day | ORAL | Status: DC

## 2015-08-23 ENCOUNTER — Telehealth: Payer: Self-pay

## 2015-08-23 DIAGNOSIS — T8131XA Disruption of external operation (surgical) wound, not elsewhere classified, initial encounter: Secondary | ICD-10-CM | POA: Insufficient documentation

## 2015-08-23 NOTE — Telephone Encounter (Signed)
Called and left message asking patient to call back. Thanks!

## 2015-08-23 NOTE — Telephone Encounter (Signed)
-----   Message from Henrietta HooverLinda C Bernhardt, NP sent at 08/22/2015  8:23 AM EST ----- Cmet OK. Lipids Show elevated triglycerides. We need to recheck fasting.CBC continues to show anemia. Will prescribe iron. We need to have her come in for repeat A1c. Apparently correct specimen not sent.

## 2015-08-27 NOTE — Telephone Encounter (Signed)
Spoke with patient, advised of anemia and to pick up prescription that was sent to [facility] and take as directed. Also advised of elevated triglycerides and that patient needs to come back in fasting for repeat labs. Patient states she will have to call back to schedule appointment to come in for labs. Thanks!

## 2015-09-24 ENCOUNTER — Other Ambulatory Visit: Payer: Medicaid Other

## 2015-09-24 DIAGNOSIS — E138 Other specified diabetes mellitus with unspecified complications: Secondary | ICD-10-CM

## 2015-09-24 LAB — LIPID PANEL
Cholesterol: 164 mg/dL (ref 125–200)
HDL: 31 mg/dL — AB (ref >=46)
LDL CALC: 81 mg/dL (ref <130)
TRIGLYCERIDES: 260 mg/dL — AB (ref <150)
Total CHOL/HDL Ratio: 5.3 Ratio — ABNORMAL HIGH (ref <=5.0)
VLDL: 52 mg/dL — AB (ref <30)

## 2015-09-25 ENCOUNTER — Other Ambulatory Visit: Payer: Self-pay

## 2015-09-25 LAB — HEMOGLOBIN A1C
Hgb A1c MFr Bld: 5.6 % (ref <5.7)
Mean Plasma Glucose: 114 mg/dL (ref <117)

## 2015-09-25 MED ORDER — LINAGLIPTIN 5 MG PO TABS: 5.0000 mg | ORAL_TABLET | Freq: Every day | ORAL | Status: DC

## 2015-10-01 ENCOUNTER — Other Ambulatory Visit: Payer: Self-pay | Admitting: Family Medicine

## 2015-10-01 DIAGNOSIS — E138 Other specified diabetes mellitus with unspecified complications: Secondary | ICD-10-CM

## 2015-10-01 MED ORDER — LINAGLIPTIN 5 MG PO TABS: 5.0000 mg | ORAL_TABLET | Freq: Every day | ORAL | Status: DC

## 2015-10-01 MED ORDER — LIRAGLUTIDE 18 MG/3ML ~~LOC~~ SOPN: 1.8000 mg | PEN_INJECTOR | Freq: Every day | SUBCUTANEOUS | Status: DC

## 2015-10-01 NOTE — Telephone Encounter (Signed)
Refills have been sent into pharmacy. Thanks!  

## 2015-10-06 ENCOUNTER — Emergency Department (HOSPITAL_COMMUNITY)
Admission: EM | Admit: 2015-10-06 | Discharge: 2015-10-06 | Disposition: A | Discharge status: Home / Self Care | Payer: Medicaid Other | Attending: Emergency Medicine | Admitting: Emergency Medicine

## 2015-10-06 ENCOUNTER — Emergency Department (HOSPITAL_COMMUNITY): Payer: Medicaid Other

## 2015-10-06 ENCOUNTER — Encounter (HOSPITAL_COMMUNITY): Payer: Self-pay | Admitting: Emergency Medicine

## 2015-10-06 DIAGNOSIS — Z87891 Personal history of nicotine dependence: Secondary | ICD-10-CM | POA: Diagnosis not present

## 2015-10-06 DIAGNOSIS — Z79899 Other long term (current) drug therapy: Secondary | ICD-10-CM | POA: Insufficient documentation

## 2015-10-06 DIAGNOSIS — E079 Disorder of thyroid, unspecified: Secondary | ICD-10-CM | POA: Insufficient documentation

## 2015-10-06 DIAGNOSIS — J069 Acute upper respiratory infection, unspecified: Secondary | ICD-10-CM | POA: Diagnosis not present

## 2015-10-06 DIAGNOSIS — Z7951 Long term (current) use of inhaled steroids: Secondary | ICD-10-CM | POA: Diagnosis not present

## 2015-10-06 DIAGNOSIS — G629 Polyneuropathy, unspecified: Secondary | ICD-10-CM | POA: Insufficient documentation

## 2015-10-06 DIAGNOSIS — J441 Chronic obstructive pulmonary disease with (acute) exacerbation: Secondary | ICD-10-CM | POA: Insufficient documentation

## 2015-10-06 DIAGNOSIS — Z791 Long term (current) use of non-steroidal anti-inflammatories (NSAID): Secondary | ICD-10-CM | POA: Insufficient documentation

## 2015-10-06 DIAGNOSIS — R059 Cough, unspecified: Secondary | ICD-10-CM

## 2015-10-06 DIAGNOSIS — R05 Cough: Secondary | ICD-10-CM

## 2015-10-06 DIAGNOSIS — Z7984 Long term (current) use of oral hypoglycemic drugs: Secondary | ICD-10-CM | POA: Insufficient documentation

## 2015-10-06 DIAGNOSIS — E785 Hyperlipidemia, unspecified: Secondary | ICD-10-CM | POA: Diagnosis not present

## 2015-10-06 DIAGNOSIS — E119 Type 2 diabetes mellitus without complications: Secondary | ICD-10-CM | POA: Diagnosis not present

## 2015-10-06 LAB — RAPID STREP SCREEN (MED CTR MEBANE ONLY): Streptococcus, Group A Screen (Direct): NEGATIVE

## 2015-10-06 MED ORDER — NAPROXEN 250 MG PO TABS
500.0000 mg | ORAL_TABLET | Freq: Once | ORAL | Status: AC
  Administered 2015-10-06: 500 mg via ORAL
  Filled 2015-10-06: qty 2

## 2015-10-06 MED ORDER — GUAIFENESIN ER 600 MG PO TB12: 600.0000 mg | ORAL_TABLET | Freq: Two times a day (BID) | ORAL | Status: DC | PRN

## 2015-10-06 MED ORDER — IPRATROPIUM-ALBUTEROL 0.5-2.5 (3) MG/3ML IN SOLN
3.0000 mL | Freq: Once | RESPIRATORY_TRACT | Status: AC
  Administered 2015-10-06: 3 mL via RESPIRATORY_TRACT
  Filled 2015-10-06: qty 3

## 2015-10-06 MED ORDER — PREDNISONE 20 MG PO TABS: 40.0000 mg | ORAL_TABLET | Freq: Every day | ORAL | Status: DC

## 2015-10-06 MED ORDER — BENZONATATE 100 MG PO CAPS: 100.0000 mg | ORAL_CAPSULE | Freq: Three times a day (TID) | ORAL | Status: DC

## 2015-10-06 MED ORDER — DEXAMETHASONE SODIUM PHOSPHATE 10 MG/ML IJ SOLN
10.0000 mg | Freq: Once | INTRAMUSCULAR | Status: AC
  Administered 2015-10-06: 10 mg via INTRAMUSCULAR
  Filled 2015-10-06: qty 1

## 2015-10-06 MED ORDER — ALBUTEROL SULFATE HFA 108 (90 BASE) MCG/ACT IN AERS: 1.0000 | INHALATION_SPRAY | Freq: Four times a day (QID) | RESPIRATORY_TRACT | Status: DC | PRN

## 2015-10-06 NOTE — ED Provider Notes (Signed)
CSN: [id number]     Arrival date & time 10/06/15  1633 History   First MD Initiated Contact with Patient 10/06/15 1824     Chief Complaint  Patient presents with  . URI    HPI   [name] is an 50 y.o. female with history of DM, COPD who presents to the ED for evaluation of cough, sore throat. She states her symptoms began two days ago. She states she has a cough and feels like it is productive but the sputum is stuck in her chest. She states she also noticed sore throat that started yesterday. Denies dysphagia or trismus. States she had a fever of 1031F this AM and took two doses of Tylenol at home. Temp in triage 98.31F. She states she was diagnosed with COPD in the past but never started on any treatment. States she quit smoking last year. Denies chest pain or SOB but states she feels wheezy. Denies abdominal pain, N/V/D.   Past Medical History  Diagnosis Date  . Neuropathy (HCC)   . Diabetes mellitus without complication (HCC)   . COPD (chronic obstructive pulmonary disease) (HCC)   . Hyperlipidemia   . Oxygen deficiency     prescribed but doesn't use regularly  . Thyroid disease    Past Surgical History  Procedure Laterality Date  . Cholecystectomy    . Knee surgery      tissue removed, 2011?  Marland Kitchen Arm surgery  2009    nerver repair to both arms   . Carpal tunnel release  2009   Family History  Problem Relation Age of Onset  . COPD Mother   . Heart disease Father   . Cancer Maternal Grandmother    Social History  Substance Use Topics  . Smoking status: Former Smoker    Quit date: 01/11/2015  . Smokeless tobacco: None  . Alcohol Use: No   OB History    No data available     Review of Systems  All other systems reviewed and are negative.     Allergies  Tetanus toxoids  Home Medications   Prior to Admission medications   Medication Sig Start Date End Date Taking? Authorizing Provider  atorvastatin (LIPITOR) 40 MG tablet Take 1 tablet (40 mg total) by mouth  daily. 04/09/15   Henrietta Hoover, NP  ferrous sulfate 325 (65 FE) MG tablet Take 1 tablet (325 mg total) by mouth daily with breakfast. 08/22/15   Henrietta Hoover, NP  Fluticasone-Salmeterol (ADVAIR) 250-50 MCG/DOSE AEPB Inhale 1 puff into the lungs 2 (two) times daily. Reported on 08/15/2015    Historical Provider, MD  furosemide (LASIX) 40 MG tablet Take 1 tablet (40 mg total) by mouth daily. 04/09/15   Henrietta Hoover, NP  gabapentin (NEURONTIN) 300 MG capsule Take 1 capsule (300 mg total) by mouth 2 (two) times daily. 04/09/15   Henrietta Hoover, NP  HYDROcodone-acetaminophen (NORCO/VICODIN) 5-325 MG per tablet Take 1-2 tablets by mouth every 4 (four) hours as needed for moderate pain. Patient not taking: Reported on 04/09/2015 03/30/15   Gilda Crease, MD  levothyroxine (SYNTHROID, LEVOTHROID) 25 MCG tablet Take 1 tablet (25 mcg total) by mouth daily before breakfast. 04/09/15   Henrietta Hoover, NP  linagliptin (TRADJENTA) 5 MG TABS tablet Take 1 tablet (5 mg total) by mouth daily. 10/01/15   Henrietta Hoover, NP  Liraglutide (VICTOZA) 18 MG/3ML SOPN Inject 0.3 mLs (1.8 mg total) into the skin daily. 10/01/15   Henrietta Hoover,  NP  meloxicam (MOBIC) 15 MG tablet Take 1 tablet (15 mg total) by mouth daily. 05/02/15   Henrietta Hoover, NP  metolazone (ZAROXOLYN) 5 MG tablet Take 1 tablet (5 mg total) by mouth daily. 30 mins prior to furosemide 05/02/15   Henrietta Hoover, NP  omeprazole (PRILOSEC) 40 MG capsule Take 1 capsule (40 mg total) by mouth daily. 05/02/15   Henrietta Hoover, NP  potassium chloride (MICRO-K) 10 MEQ CR capsule Take 2 capsules (20 mEq total) by mouth daily. 06/21/15   Henrietta Hoover, NP  tiZANidine (ZANAFLEX) 4 MG capsule Take 4 mg by mouth 3 (three) times daily as needed for muscle spasms.    Historical Provider, MD  traMADol (ULTRAM) 50 MG tablet Take 1 tablet (50 mg total) by mouth every 6 (six) hours as needed. 06/08/15   Garlon Hatchet, PA-C   BP 136/75 mmHg   Pulse 80  Temp(Src) 98.2 F (36.8 C) (Oral)  Resp 22  SpO2 96% Physical Exam  Constitutional: She is oriented to person, place, and time.  HENT:  Right Ear: External ear normal.  Left Ear: External ear normal.  Nose: Nose normal.  Mouth/Throat: Oropharynx is clear and moist and mucous membranes are normal. No oral lesions. No trismus in the jaw. No oropharyngeal exudate or posterior oropharyngeal edema.  Posterior oropharyngeal cobblestoning  Eyes: Conjunctivae and EOM are normal. Pupils are equal, round, and reactive to light.  Neck: Normal range of motion. Neck supple.  Cardiovascular: Normal rate, regular rhythm, normal heart sounds and intact distal pulses.   Pulmonary/Chest: Effort normal and breath sounds normal. No respiratory distress. She has no wheezes. She exhibits no tenderness.  Inspiratory and expiratory wheezes in bilateral lung fields. No increased WOB or tachypnea.   Abdominal: Soft. Bowel sounds are normal. She exhibits no distension. There is no tenderness. There is no rebound and no guarding.  Musculoskeletal: She exhibits no edema.  Neurological: She is alert and oriented to person, place, and time. No cranial nerve deficit.  Skin: Skin is warm and dry.  Psychiatric: She has a normal mood and affect.  Nursing note and vitals reviewed.   ED Course  Procedures (including critical care time) Labs Review Labs Reviewed  RAPID STREP SCREEN (NOT AT [facility])  CULTURE, GROUP A STREP [facility])    Imaging Review Dg Chest 2 View  10/06/2015  CLINICAL DATA:  Sore throat, cough EXAM: CHEST  2 VIEW COMPARISON:  None. FINDINGS: Lungs are clear.  No pleural effusion or pneumothorax. The heart is normal in size. Visualized osseous structures are within normal limits. Cholecystectomy clips. IMPRESSION: No evidence of acute cardiopulmonary disease. Electronically Signed   By: Charline Bills M.D.   On: 10/06/2015 18:27   I have personally reviewed and evaluated these images and  lab results as part of my medical decision-making.   EKG Interpretation None      MDM   Final diagnoses:  URI (upper respiratory infection)  Cough     Wheezing cleared with duoneb x 2. Rapid strep was obtained in triage and was negative. CXR negative. Likely viral URI / COPD exacerbation. Pt is not hypoxic, no increased wob, no tachypnea. She is afebrile. Will d/c home with rx for prednisone, albuterol, mucinex, and tessalon. Instructed to f/u with PCP. ER return precautions given.     Carlene Coria, PA-C 10/07/15 1211  Vanetta Mulders, MD 10/09/15 [phone]

## 2015-10-06 NOTE — Discharge Instructions (Signed)
Your chest x-ray was normal. Your rapid strep test was negative. You likely have a viral URI (a cold). This can exacerbate your COPD. I will give you a prescription for albuterol to use as needed. I will also give you a few more days of steroids, and cough medicine. Please follow up with your primary care provider this week. Return to the ER for new or worsening symptoms.

## 2015-10-06 NOTE — ED Notes (Signed)
Sore throat started yesterday, woke up today with a 102 temp-- dry cough, "I can't cough that phlegm up" . Pt has had pneumonia vaccine-- not flu shot.

## 2015-10-09 LAB — CULTURE, GROUP A STREP (THRC)

## 2015-10-16 ENCOUNTER — Other Ambulatory Visit: Payer: Self-pay | Admitting: Family Medicine

## 2015-10-17 ENCOUNTER — Observation Stay (HOSPITAL_COMMUNITY)
Admission: EM | Admit: 2015-10-17 | Discharge: 2015-10-19 | Disposition: A | Discharge status: Home / Self Care | Payer: Medicaid Other | Attending: Internal Medicine | Admitting: Internal Medicine

## 2015-10-17 ENCOUNTER — Ambulatory Visit: Payer: Medicaid Other | Admitting: Family Medicine

## 2015-10-17 ENCOUNTER — Encounter (HOSPITAL_COMMUNITY): Payer: Self-pay | Admitting: *Deleted

## 2015-10-17 DIAGNOSIS — Z6841 Body Mass Index (BMI) 40.0 and over, adult: Secondary | ICD-10-CM | POA: Diagnosis not present

## 2015-10-17 DIAGNOSIS — Z9989 Dependence on other enabling machines and devices: Secondary | ICD-10-CM | POA: Diagnosis not present

## 2015-10-17 DIAGNOSIS — R05 Cough: Secondary | ICD-10-CM | POA: Diagnosis not present

## 2015-10-17 DIAGNOSIS — J449 Chronic obstructive pulmonary disease, unspecified: Secondary | ICD-10-CM | POA: Diagnosis not present

## 2015-10-17 DIAGNOSIS — Z79899 Other long term (current) drug therapy: Secondary | ICD-10-CM | POA: Insufficient documentation

## 2015-10-17 DIAGNOSIS — M62838 Other muscle spasm: Secondary | ICD-10-CM | POA: Diagnosis present

## 2015-10-17 DIAGNOSIS — E7800 Pure hypercholesterolemia, unspecified: Secondary | ICD-10-CM | POA: Diagnosis present

## 2015-10-17 DIAGNOSIS — Z23 Encounter for immunization: Secondary | ICD-10-CM | POA: Insufficient documentation

## 2015-10-17 DIAGNOSIS — Z7984 Long term (current) use of oral hypoglycemic drugs: Secondary | ICD-10-CM | POA: Diagnosis not present

## 2015-10-17 DIAGNOSIS — E1140 Type 2 diabetes mellitus with diabetic neuropathy, unspecified: Secondary | ICD-10-CM | POA: Insufficient documentation

## 2015-10-17 DIAGNOSIS — Z87891 Personal history of nicotine dependence: Secondary | ICD-10-CM | POA: Insufficient documentation

## 2015-10-17 DIAGNOSIS — E1169 Type 2 diabetes mellitus with other specified complication: Secondary | ICD-10-CM | POA: Insufficient documentation

## 2015-10-17 DIAGNOSIS — N179 Acute kidney failure, unspecified: Secondary | ICD-10-CM | POA: Insufficient documentation

## 2015-10-17 DIAGNOSIS — G4733 Obstructive sleep apnea (adult) (pediatric): Secondary | ICD-10-CM | POA: Diagnosis not present

## 2015-10-17 DIAGNOSIS — E039 Hypothyroidism, unspecified: Secondary | ICD-10-CM | POA: Diagnosis not present

## 2015-10-17 DIAGNOSIS — E876 Hypokalemia: Secondary | ICD-10-CM | POA: Diagnosis not present

## 2015-10-17 DIAGNOSIS — N289 Disorder of kidney and ureter, unspecified: Secondary | ICD-10-CM | POA: Diagnosis present

## 2015-10-17 DIAGNOSIS — E119 Type 2 diabetes mellitus without complications: Secondary | ICD-10-CM

## 2015-10-17 DIAGNOSIS — Z791 Long term (current) use of non-steroidal anti-inflammatories (NSAID): Secondary | ICD-10-CM | POA: Insufficient documentation

## 2015-10-17 DIAGNOSIS — J9601 Acute respiratory failure with hypoxia: Secondary | ICD-10-CM | POA: Insufficient documentation

## 2015-10-17 DIAGNOSIS — R059 Cough, unspecified: Secondary | ICD-10-CM | POA: Insufficient documentation

## 2015-10-17 DIAGNOSIS — G8929 Other chronic pain: Secondary | ICD-10-CM | POA: Insufficient documentation

## 2015-10-17 DIAGNOSIS — Z22322 Carrier or suspected carrier of Methicillin resistant Staphylococcus aureus: Secondary | ICD-10-CM | POA: Diagnosis not present

## 2015-10-17 DIAGNOSIS — E785 Hyperlipidemia, unspecified: Secondary | ICD-10-CM | POA: Insufficient documentation

## 2015-10-17 DIAGNOSIS — Z9981 Dependence on supplemental oxygen: Secondary | ICD-10-CM | POA: Diagnosis not present

## 2015-10-17 DIAGNOSIS — E138 Other specified diabetes mellitus with unspecified complications: Secondary | ICD-10-CM

## 2015-10-17 DIAGNOSIS — E079 Disorder of thyroid, unspecified: Secondary | ICD-10-CM | POA: Diagnosis not present

## 2015-10-17 MED ORDER — TIZANIDINE HCL 4 MG PO TABS
4.0000 mg | ORAL_TABLET | Freq: Once | ORAL | Status: AC
  Administered 2015-10-18: 4 mg via ORAL
  Filled 2015-10-17: qty 1

## 2015-10-17 NOTE — ED Notes (Signed)
Pt states she has not had her zanaflex to tx her generalized body spasms for 2 weeks b/c her doctor won't send in the prescription.  States she finally came in today b/c the spasms were so bad she could barely walk.

## 2015-10-18 ENCOUNTER — Observation Stay (HOSPITAL_COMMUNITY): Payer: Medicaid Other

## 2015-10-18 ENCOUNTER — Encounter (HOSPITAL_COMMUNITY): Payer: Self-pay | Admitting: Internal Medicine

## 2015-10-18 DIAGNOSIS — R05 Cough: Secondary | ICD-10-CM | POA: Diagnosis not present

## 2015-10-18 DIAGNOSIS — N289 Disorder of kidney and ureter, unspecified: Secondary | ICD-10-CM | POA: Diagnosis present

## 2015-10-18 DIAGNOSIS — E876 Hypokalemia: Secondary | ICD-10-CM | POA: Diagnosis present

## 2015-10-18 DIAGNOSIS — G4733 Obstructive sleep apnea (adult) (pediatric): Secondary | ICD-10-CM | POA: Diagnosis not present

## 2015-10-18 DIAGNOSIS — N179 Acute kidney failure, unspecified: Secondary | ICD-10-CM | POA: Diagnosis present

## 2015-10-18 DIAGNOSIS — J9601 Acute respiratory failure with hypoxia: Secondary | ICD-10-CM | POA: Insufficient documentation

## 2015-10-18 DIAGNOSIS — E119 Type 2 diabetes mellitus without complications: Secondary | ICD-10-CM | POA: Diagnosis not present

## 2015-10-18 DIAGNOSIS — Z9989 Dependence on other enabling machines and devices: Secondary | ICD-10-CM

## 2015-10-18 DIAGNOSIS — R059 Cough, unspecified: Secondary | ICD-10-CM | POA: Insufficient documentation

## 2015-10-18 LAB — BASIC METABOLIC PANEL
ANION GAP: 18 — AB (ref 5–15)
ANION GAP: 11 (ref 5–15)
ANION GAP: 13 (ref 5–15)
ANION GAP: 11 (ref 5–15)
BUN: 10 mg/dL (ref 6–20)
BUN: 9 mg/dL (ref 6–20)
BUN: 8 mg/dL (ref 6–20)
BUN: 8 mg/dL (ref 6–20)
CHLORIDE: 90 mmol/L — AB (ref 101–111)
CO2: 34 mmol/L — ABNORMAL HIGH (ref 22–32)
CO2: 34 mmol/L — ABNORMAL HIGH (ref 22–32)
CO2: 31 mmol/L (ref 22–32)
CO2: 31 mmol/L (ref 22–32)
CREATININE: 1.31 mg/dL — AB (ref 0.44–1.00)
Calcium: 8.3 mg/dL — ABNORMAL LOW (ref 8.9–10.3)
Calcium: 7.6 mg/dL — ABNORMAL LOW (ref 8.9–10.3)
Calcium: 7.5 mg/dL — ABNORMAL LOW (ref 8.9–10.3)
Calcium: 7.9 mg/dL — ABNORMAL LOW (ref 8.9–10.3)
Chloride: 84 mmol/L — ABNORMAL LOW (ref 101–111)
Chloride: 91 mmol/L — ABNORMAL LOW (ref 101–111)
Chloride: 95 mmol/L — ABNORMAL LOW (ref 101–111)
Creatinine, Ser: 1.35 mg/dL — ABNORMAL HIGH (ref 0.44–1.00)
Creatinine, Ser: 1.22 mg/dL — ABNORMAL HIGH (ref 0.44–1.00)
Creatinine, Ser: 1.18 mg/dL — ABNORMAL HIGH (ref 0.44–1.00)
GFR calc Af Amer: 52 mL/min — ABNORMAL LOW (ref >60)
GFR calc Af Amer: 58 mL/min — ABNORMAL LOW (ref >60)
GFR calc Af Amer: >60 mL/min (ref >60)
GFR calc non Af Amer: 46 mL/min — ABNORMAL LOW (ref >60)
GFR calc non Af Amer: 52 mL/min — ABNORMAL LOW (ref >60)
GFR, EST AFRICAN AMERICAN: 54 mL/min — AB (ref >60)
GFR, EST NON AFRICAN AMERICAN: 45 mL/min — AB (ref >60)
GFR, EST NON AFRICAN AMERICAN: 50 mL/min — AB (ref >60)
GLUCOSE: 130 mg/dL — AB (ref 65–99)
GLUCOSE: 126 mg/dL — AB (ref 65–99)
Glucose, Bld: 113 mg/dL — ABNORMAL HIGH (ref 65–99)
Glucose, Bld: 115 mg/dL — ABNORMAL HIGH (ref 65–99)
POTASSIUM: 2 mmol/L — AB (ref 3.5–5.1)
POTASSIUM: 2.8 mmol/L — AB (ref 3.5–5.1)
POTASSIUM: 2.7 mmol/L — AB (ref 3.5–5.1)
POTASSIUM: 2.9 mmol/L — AB (ref 3.5–5.1)
SODIUM: 136 mmol/L (ref 135–145)
SODIUM: 135 mmol/L (ref 135–145)
Sodium: 135 mmol/L (ref 135–145)
Sodium: 137 mmol/L (ref 135–145)

## 2015-10-18 LAB — MRSA PCR SCREENING: MRSA by PCR: POSITIVE — AB

## 2015-10-18 LAB — CBC
HCT: 32.9 % — ABNORMAL LOW (ref 36.0–46.0)
HEMATOCRIT: 36.2 % (ref 36.0–46.0)
HEMOGLOBIN: 11.6 g/dL — AB (ref 12.0–15.0)
Hemoglobin: 10.5 g/dL — ABNORMAL LOW (ref 12.0–15.0)
MCH: 28.6 pg (ref 26.0–34.0)
MCH: 28.2 pg (ref 26.0–34.0)
MCHC: 32 g/dL (ref 30.0–36.0)
MCHC: 31.9 g/dL (ref 30.0–36.0)
MCV: 89.4 fL (ref 78.0–100.0)
MCV: 88.4 fL (ref 78.0–100.0)
Platelets: 232 10*3/uL (ref 150–400)
Platelets: 203 10*3/uL (ref 150–400)
RBC: 4.05 MIL/uL (ref 3.87–5.11)
RBC: 3.72 MIL/uL — AB (ref 3.87–5.11)
RDW: 16.8 % — ABNORMAL HIGH (ref 11.5–15.5)
RDW: 16.8 % — AB (ref 11.5–15.5)
WBC: 13.4 10*3/uL — AB (ref 4.0–10.5)
WBC: 11.2 10*3/uL — AB (ref 4.0–10.5)

## 2015-10-18 LAB — MAGNESIUM
MAGNESIUM: 1.4 mg/dL — AB (ref 1.7–2.4)
Magnesium: 2 mg/dL (ref 1.7–2.4)

## 2015-10-18 LAB — GLUCOSE, CAPILLARY
GLUCOSE-CAPILLARY: 204 mg/dL — AB (ref 65–99)
Glucose-Capillary: 131 mg/dL — ABNORMAL HIGH (ref 65–99)

## 2015-10-18 LAB — CBG MONITORING, ED
GLUCOSE-CAPILLARY: 123 mg/dL — AB (ref 65–99)
Glucose-Capillary: 94 mg/dL (ref 65–99)

## 2015-10-18 MED ORDER — INSULIN ASPART 100 UNIT/ML ~~LOC~~ SOLN
0.0000 [IU] | Freq: Three times a day (TID) | SUBCUTANEOUS | Status: DC
  Administered 2015-10-18: 1 [IU] via SUBCUTANEOUS
  Administered 2015-10-18: 3 [IU] via SUBCUTANEOUS
  Filled 2015-10-18: qty 1

## 2015-10-18 MED ORDER — IPRATROPIUM-ALBUTEROL 0.5-2.5 (3) MG/3ML IN SOLN
RESPIRATORY_TRACT | Status: AC
  Administered 2015-10-18: 3 mL
  Filled 2015-10-18: qty 3

## 2015-10-18 MED ORDER — LINAGLIPTIN 5 MG PO TABS
5.0000 mg | ORAL_TABLET | Freq: Every day | ORAL | Status: DC
  Administered 2015-10-18 – 2015-10-19 (×2): 5 mg via ORAL
  Filled 2015-10-18 (×2): qty 1

## 2015-10-18 MED ORDER — POTASSIUM CHLORIDE 10 MEQ/100ML IV SOLN
10.0000 meq | INTRAVENOUS | Status: AC
  Administered 2015-10-18 (×4): 10 meq via INTRAVENOUS
  Filled 2015-10-18 (×4): qty 100

## 2015-10-18 MED ORDER — INFLUENZA VAC SPLIT QUAD 0.5 ML IM SUSY
0.5000 mL | PREFILLED_SYRINGE | INTRAMUSCULAR | Status: AC
  Administered 2015-10-19: 0.5 mL via INTRAMUSCULAR
  Filled 2015-10-18: qty 0.5

## 2015-10-18 MED ORDER — ACETAMINOPHEN 650 MG RE SUPP: 650.0000 mg | Freq: Four times a day (QID) | RECTAL | Status: DC | PRN

## 2015-10-18 MED ORDER — ENOXAPARIN SODIUM 40 MG/0.4ML ~~LOC~~ SOLN
40.0000 mg | SUBCUTANEOUS | Status: DC
  Administered 2015-10-18 – 2015-10-19 (×2): 40 mg via SUBCUTANEOUS
  Filled 2015-10-18 (×2): qty 0.4

## 2015-10-18 MED ORDER — MUPIROCIN 2 % EX OINT
TOPICAL_OINTMENT | CUTANEOUS | Status: AC
  Filled 2015-10-18: qty 22

## 2015-10-18 MED ORDER — LEVOTHYROXINE SODIUM 25 MCG PO TABS
25.0000 ug | ORAL_TABLET | Freq: Every day | ORAL | Status: DC
  Administered 2015-10-18 – 2015-10-19 (×2): 25 ug via ORAL
  Filled 2015-10-18 (×2): qty 1

## 2015-10-18 MED ORDER — POTASSIUM CHLORIDE CRYS ER 20 MEQ PO TBCR: 40.0000 meq | EXTENDED_RELEASE_TABLET | Freq: Once | ORAL | Status: DC

## 2015-10-18 MED ORDER — MUPIROCIN 2 % EX OINT
TOPICAL_OINTMENT | Freq: Two times a day (BID) | CUTANEOUS | Status: DC
  Administered 2015-10-18 – 2015-10-19 (×2): via NASAL

## 2015-10-18 MED ORDER — POTASSIUM CHLORIDE CRYS ER 20 MEQ PO TBCR
40.0000 meq | EXTENDED_RELEASE_TABLET | ORAL | Status: AC
  Administered 2015-10-18 (×2): 40 meq via ORAL
  Filled 2015-10-18 (×2): qty 2

## 2015-10-18 MED ORDER — IPRATROPIUM-ALBUTEROL 0.5-2.5 (3) MG/3ML IN SOLN: 3.0000 mL | Freq: Three times a day (TID) | RESPIRATORY_TRACT | Status: DC

## 2015-10-18 MED ORDER — ONDANSETRON HCL 4 MG PO TABS: 4.0000 mg | ORAL_TABLET | Freq: Four times a day (QID) | ORAL | Status: DC | PRN

## 2015-10-18 MED ORDER — POTASSIUM CHLORIDE CRYS ER 20 MEQ PO TBCR
80.0000 meq | EXTENDED_RELEASE_TABLET | Freq: Once | ORAL | Status: AC
  Administered 2015-10-18: 80 meq via ORAL
  Filled 2015-10-18: qty 4

## 2015-10-18 MED ORDER — CHLORHEXIDINE GLUCONATE CLOTH 2 % EX PADS
6.0000 | MEDICATED_PAD | Freq: Every day | CUTANEOUS | Status: DC
  Administered 2015-10-19: 6 via TOPICAL

## 2015-10-18 MED ORDER — POTASSIUM CHLORIDE CRYS ER 20 MEQ PO TBCR
20.0000 meq | EXTENDED_RELEASE_TABLET | Freq: Every day | ORAL | Status: DC
  Administered 2015-10-18: 20 meq via ORAL
  Filled 2015-10-18: qty 1

## 2015-10-18 MED ORDER — POTASSIUM CHLORIDE CRYS ER 20 MEQ PO TBCR
40.0000 meq | EXTENDED_RELEASE_TABLET | Freq: Two times a day (BID) | ORAL | Status: DC
  Administered 2015-10-19: 40 meq via ORAL
  Filled 2015-10-18: qty 2

## 2015-10-18 MED ORDER — MAGNESIUM SULFATE 2 GM/50ML IV SOLN
2.0000 g | Freq: Once | INTRAVENOUS | Status: AC
  Administered 2015-10-18: 2 g via INTRAVENOUS
  Filled 2015-10-18: qty 50

## 2015-10-18 MED ORDER — PANTOPRAZOLE SODIUM 40 MG PO TBEC
40.0000 mg | DELAYED_RELEASE_TABLET | Freq: Every day | ORAL | Status: DC
  Administered 2015-10-18 – 2015-10-19 (×2): 40 mg via ORAL
  Filled 2015-10-18 (×2): qty 1

## 2015-10-18 MED ORDER — SODIUM CHLORIDE 0.9 % IV BOLUS (SEPSIS)
250.0000 mL | Freq: Once | INTRAVENOUS | Status: AC
  Administered 2015-10-18: 250 mL via INTRAVENOUS

## 2015-10-18 MED ORDER — GABAPENTIN 300 MG PO CAPS
300.0000 mg | ORAL_CAPSULE | Freq: Two times a day (BID) | ORAL | Status: DC
  Administered 2015-10-18 – 2015-10-19 (×3): 300 mg via ORAL
  Filled 2015-10-18 (×3): qty 1

## 2015-10-18 MED ORDER — ACETAMINOPHEN 325 MG PO TABS
650.0000 mg | ORAL_TABLET | Freq: Four times a day (QID) | ORAL | Status: DC | PRN
  Administered 2015-10-18: 650 mg via ORAL
  Filled 2015-10-18: qty 2

## 2015-10-18 MED ORDER — FERROUS SULFATE 325 (65 FE) MG PO TABS
325.0000 mg | ORAL_TABLET | Freq: Every day | ORAL | Status: DC
  Administered 2015-10-18 – 2015-10-19 (×2): 325 mg via ORAL
  Filled 2015-10-18 (×2): qty 1

## 2015-10-18 MED ORDER — LIRAGLUTIDE 18 MG/3ML ~~LOC~~ SOPN: 1.8000 mg | PEN_INJECTOR | Freq: Every day | SUBCUTANEOUS | Status: DC

## 2015-10-18 MED ORDER — DM-GUAIFENESIN ER 30-600 MG PO TB12
1.0000 | ORAL_TABLET | Freq: Two times a day (BID) | ORAL | Status: DC
  Administered 2015-10-19: 1 via ORAL
  Filled 2015-10-18: qty 1

## 2015-10-18 MED ORDER — ALBUTEROL SULFATE (2.5 MG/3ML) 0.083% IN NEBU
2.5000 mg | INHALATION_SOLUTION | Freq: Four times a day (QID) | RESPIRATORY_TRACT | Status: DC | PRN
  Administered 2015-10-19: 2.5 mg via RESPIRATORY_TRACT
  Filled 2015-10-18: qty 3

## 2015-10-18 MED ORDER — ONDANSETRON HCL 4 MG/2ML IJ SOLN: 4.0000 mg | Freq: Four times a day (QID) | INTRAMUSCULAR | Status: DC | PRN

## 2015-10-18 MED ORDER — ALBUTEROL SULFATE HFA 108 (90 BASE) MCG/ACT IN AERS: 1.0000 | INHALATION_SPRAY | Freq: Four times a day (QID) | RESPIRATORY_TRACT | Status: DC | PRN

## 2015-10-18 MED ORDER — TIZANIDINE HCL 4 MG PO TABS
4.0000 mg | ORAL_TABLET | Freq: Three times a day (TID) | ORAL | Status: DC | PRN
  Filled 2015-10-18: qty 1

## 2015-10-18 MED ORDER — ATORVASTATIN CALCIUM 40 MG PO TABS
40.0000 mg | ORAL_TABLET | Freq: Every day | ORAL | Status: DC
  Administered 2015-10-18 – 2015-10-19 (×2): 40 mg via ORAL
  Filled 2015-10-18: qty 1
  Filled 2015-10-18: qty 4

## 2015-10-18 MED ORDER — HYDROCODONE-ACETAMINOPHEN 5-325 MG PO TABS
1.0000 | ORAL_TABLET | Freq: Once | ORAL | Status: AC
  Administered 2015-10-18: 1 via ORAL
  Filled 2015-10-18: qty 1

## 2015-10-18 NOTE — ED Notes (Signed)
Pt CBG, 94.

## 2015-10-18 NOTE — Progress Notes (Signed)
Tabitha Scott [id number] Admission Data: 10/18/2015 4:33 PM  Attending Provider: Eduard Clos, MD  NWG:[id number],MVHQIONG A., MD Consults/ Treatment Team:    Tabitha Scott is a 50 y.o. female patient admitted from ED awake, alert  & orientated  X 3,  Full Code, VSS - Blood pressure 93/49, pulse 88, temperature 98.7 F (37.1 C), temperature source Oral, resp. rate 18, height  (1.626 m), weight 134.5 kg (296 lb 8.3 oz), SpO2 96 %., no c/o shortness of breath, no c/o chest pain, no distress noted. Tele # 17 placed and pt is currently running:normal sinus rhythm.   IV site WDL:  antecubital right, condition patent and no redness with a transparent dsg that's clean dry and intact.  Allergies:   Allergies  Allergen Reactions  . Tetanus Toxoids Swelling     Past Medical History  Diagnosis Date  . Neuropathy (HCC)   . Diabetes mellitus without complication (HCC)   . COPD (chronic obstructive pulmonary disease) (HCC)   . Hyperlipidemia   . Oxygen deficiency     prescribed but doesn't use regularly  . Thyroid disease     History:  obtained from the patient. Tobacco/alcohol:Former smoker stopped 8 months ago. No ETOH or drug use.   Pt orientation to unit, room and routine. Information packet given to patient/family and safety video watched.  Admission INP armband ID verified with patient/family, and in place. SR up x 2, fall risk assessment complete with Patient and family verbalizing understanding of risks associated with falls. Pt verbalizes an understanding of how to use the call bell and to call for help before getting out of bed.  Skin, clean-dry- intact without evidence of bruising, or skin tears.   No evidence of skin break down noted on exam. no rashes, no ecchymoses, no petechiae    Will cont to monitor and assist as needed.  Cherisse Carrell, Gretta Cool, RN 10/18/2015 4:33 PM

## 2015-10-18 NOTE — ED Notes (Signed)
Breakfast Tray ordered @ 0555. 

## 2015-10-18 NOTE — Progress Notes (Signed)
REceiving report from [facility] in ED

## 2015-10-18 NOTE — ED Notes (Signed)
Pt ambulated to restroom. 

## 2015-10-18 NOTE — Progress Notes (Signed)
  Per pmd office: patient PMH including:  Neuropathy (HCC)   . Diabetes mellitus without complication (HCC)   . COPD (chronic obstructive pulmonary disease) (HCC)   . Hyperlipidemia   . Oxygen deficiency     prescribed but doesn't use regularly  . Thyroid disease           Patient seen and examined, reported feeling better, less cramping pain, reported has been coughing for the last few days,  Lung exam with rhonchi, wheezing and upper airway sounds, will get cxr,. Start mucinex and duoneb.   She does has Nocturnal hypoxia here, reported on cpap at home, on home oxygen prn, will order both Continue supplement k and mag  MRSA colonization: start contact precaution and decolonization protocol.  Time spent: more than from 7:25 to 8 pm

## 2015-10-18 NOTE — H&P (Signed)
[facility] History and Physical  Hagen Tidd ZOX:[id number] DOB: 06/30/1966 DOA: 10/17/2015  Referring physician: Dr. Anitra Lauth. PCP: MATTHEWS,MICHELLE A., MD  Specialists: None.  Chief Complaint: Body cramps.  HPI: Tabitha Scott is a 50 y.o. female presents to the ER because of increasing cramps and spasms all over the body. Denies any nausea vomiting or diarrhea. Patient's potassium was found to be around 2. Patient states she was told of her depression was less than 2 yesterday. EKG shows normal sinus rhythm with QTC of 498 ms with U waves. Patient has been admitted for severe hypokalemia. Patient is on Lasix and metolazone probably causing her hypokalemia.   Review of Systems: As presented in the history of presenting illness, rest negative.  Past Medical History  Diagnosis Date  . Neuropathy (HCC)   . Diabetes mellitus without complication (HCC)   . COPD (chronic obstructive pulmonary disease) (HCC)   . Hyperlipidemia   . Oxygen deficiency     prescribed but doesn't use regularly  . Thyroid disease    Past Surgical History  Procedure Laterality Date  . Cholecystectomy    . Knee surgery      tissue removed, 2011?  Marland Kitchen Arm surgery  2009    nerver repair to both arms   . Carpal tunnel release  2009   Social History:  reports that she quit smoking about 9 months ago. She does not have any smokeless tobacco history on file. She reports that she does not drink alcohol or use illicit drugs. Where does patient live at home. Can patient participate in ADLs? Yes.  Allergies  Allergen Reactions  . Tetanus Toxoids Swelling    Family History:  Family History  Problem Relation Age of Onset  . COPD Mother   . Heart disease Father   . Cancer Maternal Grandmother       Prior to Admission medications   Medication Sig Start Date End Date Taking? Authorizing Provider  albuterol (PROVENTIL HFA;VENTOLIN HFA) 108 (90 Base) MCG/ACT inhaler Inhale 1-2 puffs into the lungs every 6  (six) hours as needed for wheezing or shortness of breath. 10/06/15  Yes Ace Gins Sam, PA-C  atorvastatin (LIPITOR) 40 MG tablet Take 1 tablet (40 mg total) by mouth daily. 04/09/15  Yes Henrietta Hoover, NP  ferrous sulfate 325 (65 FE) MG tablet Take 1 tablet (325 mg total) by mouth daily with breakfast. 08/22/15  Yes Henrietta Hoover, NP  gabapentin (NEURONTIN) 300 MG capsule Take 1 capsule (300 mg total) by mouth 2 (two) times daily. 04/09/15  Yes Henrietta Hoover, NP  levothyroxine (SYNTHROID, LEVOTHROID) 25 MCG tablet Take 1 tablet (25 mcg total) by mouth daily before breakfast. 04/09/15  Yes Henrietta Hoover, NP  linagliptin (TRADJENTA) 5 MG TABS tablet Take 1 tablet (5 mg total) by mouth daily. 10/01/15  Yes Henrietta Hoover, NP  Liraglutide (VICTOZA) 18 MG/3ML SOPN Inject 0.3 mLs (1.8 mg total) into the skin daily. 10/01/15  Yes Henrietta Hoover, NP  meloxicam (MOBIC) 15 MG tablet TAKE 1 TABLET(15 MG) BY MOUTH DAILY 10/16/15  Yes Henrietta Hoover, NP  metolazone (ZAROXOLYN) 5 MG tablet Take 1 tablet (5 mg total) by mouth daily. 30 mins prior to furosemide 05/02/15  Yes Henrietta Hoover, NP  omeprazole (PRILOSEC) 40 MG capsule Take 1 capsule (40 mg total) by mouth daily. 05/02/15  Yes Henrietta Hoover, NP  potassium chloride (MICRO-K) 10 MEQ CR capsule Take 2 capsules (20 mEq total) by mouth daily. 06/21/15  Yes Henrietta Hoover, NP  tiZANidine (ZANAFLEX) 4 MG capsule Take 4 mg by mouth 3 (three) times daily as needed for muscle spasms.   Yes Historical Provider, MD  benzonatate (TESSALON) 100 MG capsule Take 1 capsule (100 mg total) by mouth every 8 (eight) hours. Patient not taking: Reported on 10/18/2015 10/06/15   Ace Gins Sam, PA-C  furosemide (LASIX) 40 MG tablet Take 1 tablet (40 mg total) by mouth daily. Patient not taking: Reported on 10/18/2015 04/09/15   Henrietta Hoover, NP  guaiFENesin (MUCINEX) 600 MG 12 hr tablet Take 1 tablet (600 mg total) by mouth 2 (two) times daily as needed for  to loosen phlegm. Patient not taking: Reported on 10/18/2015 10/06/15   Ace Gins Sam, PA-C  HYDROcodone-acetaminophen (NORCO/VICODIN) 5-325 MG per tablet Take 1-2 tablets by mouth every 4 (four) hours as needed for moderate pain. Patient not taking: Reported on 04/09/2015 03/30/15   Gilda Crease, MD  predniSONE (DELTASONE) 20 MG tablet Take 2 tablets (40 mg total) by mouth daily with breakfast. Patient not taking: Reported on 10/18/2015 10/06/15   Ace Gins Sam, PA-C  traMADol (ULTRAM) 50 MG tablet Take 1 tablet (50 mg total) by mouth every 6 (six) hours as needed. Patient not taking: Reported on 10/18/2015 06/08/15   Garlon Hatchet, PA-C    Physical Exam: Filed Vitals:   10/18/15 0300 10/18/15 0330 10/18/15 0400 10/18/15 0430  BP: 109/75 104/71 101/75 106/72  Pulse: 79 78 80 81  Temp:      TempSrc:      Resp: SpO2: 94% 90% 82% 90%     General:  Moderately built and nourished.  Eyes: Anicteric no pallor.  ENT: No discharge from the ears eyes nose or mouth.  Neck: No mass felt. No JVD appreciated.  Cardiovascular: S1 and S2 heard.  Respiratory: No rhonchi or crepitations.  Abdomen: Soft nontender bowel sounds present.  Skin: No rash.  Musculoskeletal: No edema.  Psychiatric: Appears normal.  Neurologic: Alert awake oriented to time place and person. Moves all extremities.  Labs on Admission:  Basic Metabolic Panel:  Recent Labs Lab 10/18/15 0218  NA 136  K 2.0*  CL 84*  CO2 34*  GLUCOSE 113*  BUN 10  CREATININE 1.35*  CALCIUM 8.3*   Liver Function Tests: No results for input(s): AST, ALT, ALKPHOS, BILITOT, PROT, ALBUMIN in the last 168 hours. No results for input(s): LIPASE, AMYLASE in the last 168 hours. No results for input(s): AMMONIA in the last 168 hours. CBC:  Recent Labs Lab 10/18/15 0218  WBC 13.4*  HGB 11.6*  HCT 36.2  MCV 89.4  PLT 232   Cardiac Enzymes: No results for input(s): CKTOTAL, CKMB, CKMBINDEX, TROPONINI in the  last 168 hours.  BNP (last 3 results)  Recent Labs  03/12/15 1655 03/30/15 1357  BNP 98.7 153.8*    ProBNP (last 3 results) No results for input(s): PROBNP in the last 8760 hours.  CBG: No results for input(s): GLUCAP in the last 168 hours.  Radiological Exams on Admission: No results found.  EKG: Independently reviewed. Normal sinus rhythm with QTC of 498 ms and U waves.  Assessment/Plan Principal Problem:   Hypokalemia Active Problems:   Hypothyroidism   Pure hypercholesterolemia   ARF (acute renal failure) (HCC)   Diabetes mellitus type 2, controlled (HCC)   1. Severe hypokalemia - probably from diuretic use. Patient denies any nausea vomiting or diarrhea. At this time we will aggressively  replace and recheck. Check magnesium levels. Close monitoring in telemetry. I'm holding off patient's diuretics including Lasix and metolazone until potassium is corrected. 2. Acute renal failure - holding of Lasix and potassium metolazone for now. I have ordered to 50 mL normal saline bolus. 3. Diabetes mellitus type 2 - we'll continue home medications with sliding scale coverage. 4. Hypothyroidism on Synthroid. 5. Hyperlipidemia - continue statins.   DVT Prophylaxis Lovenox.  Code Status: Full code.  Family Communication: Discussed with patient.  Disposition Plan: Admit for observation.    Tenaya Hilyer N. [facility] Pager [phone].  If 7PM-7AM, please contact night-coverage [email or web address] Password [email or web address] 10/18/2015, 5:00 AM

## 2015-10-18 NOTE — Progress Notes (Signed)
Patient had positive MRSA swab

## 2015-10-18 NOTE — ED Provider Notes (Signed)
CSN: [id number]     Arrival date & time 10/17/15  1722 History   First MD Initiated Contact with Patient 10/17/15 2221     Chief Complaint  Patient presents with  . Spasms     (Consider location/radiation/quality/duration/timing/severity/associated sxs/prior Treatment) The history is provided by the patient and medical records. No language interpreter was used.     Tabitha Scott is a 50 y.o. female  with a hx of neuropathy, NIDDM, COPD, prescribed home O2 (doesn't use regularly) presents to the Emergency Department complaining of intermittent, increasing spasms.  Pt reports she was diagnosed with the spasms approx 5 years ago. She reports she normally takes Zanaflex TID but ran out approx 2 weeks ago.  She reports she began to experience an increase in spasms approx 5 days ago.  She reports they occur all over her body and often migrate. She denies numbness, weakness, abd pain, N/V/D, headache, vision changes.  Pt denies new or increased activities, periods of immobilization, darkened urine or persistent pain in any one place.  Pt reports she has been unable to get into contact with her doctor for a refill.  Patient has also intermittently taking Norco and tramadol for chronic pain.  She does take Lasix daily.  PCP: Marthann Schiller, MD  Past Medical History  Diagnosis Date  . Neuropathy (HCC)   . Diabetes mellitus without complication (HCC)   . COPD (chronic obstructive pulmonary disease) (HCC)   . Hyperlipidemia   . Oxygen deficiency     prescribed but doesn't use regularly  . Thyroid disease    Past Surgical History  Procedure Laterality Date  . Cholecystectomy    . Knee surgery      tissue removed, 2011?  Marland Kitchen Arm surgery  2009    nerver repair to both arms   . Carpal tunnel release  2009   Family History  Problem Relation Age of Onset  . COPD Mother   . Heart disease Father   . Cancer Maternal Grandmother    Social History  Substance Use Topics  . Smoking status: Former  Smoker    Quit date: 01/11/2015  . Smokeless tobacco: None  . Alcohol Use: No   OB History    No data available     Review of Systems  Constitutional: Negative for fever, diaphoresis, appetite change, fatigue and unexpected weight change.  HENT: Negative for mouth sores.   Eyes: Negative for visual disturbance.  Respiratory: Negative for cough, chest tightness, shortness of breath and wheezing.   Cardiovascular: Negative for chest pain.  Gastrointestinal: Negative for nausea, vomiting, abdominal pain, diarrhea and constipation.  Endocrine: Negative for polydipsia, polyphagia and polyuria.  Genitourinary: Negative for dysuria, urgency, frequency and hematuria.  Musculoskeletal: Negative for back pain and neck stiffness.       Spasms  Skin: Negative for rash.  Allergic/Immunologic: Negative for immunocompromised state.  Neurological: Negative for syncope, light-headedness and headaches.  Hematological: Does not bruise/bleed easily.  Psychiatric/Behavioral: Negative for sleep disturbance. The patient is not nervous/anxious.       Allergies  Tetanus toxoids  Home Medications   Prior to Admission medications   Medication Sig Start Date End Date Taking? Authorizing Provider  albuterol (PROVENTIL HFA;VENTOLIN HFA) 108 (90 Base) MCG/ACT inhaler Inhale 1-2 puffs into the lungs every 6 (six) hours as needed for wheezing or shortness of breath. 10/06/15  Yes Ace Gins Sam, PA-C  atorvastatin (LIPITOR) 40 MG tablet Take 1 tablet (40 mg total) by mouth daily. 04/09/15  Yes  Henrietta Hoover, NP  ferrous sulfate 325 (65 FE) MG tablet Take 1 tablet (325 mg total) by mouth daily with breakfast. 08/22/15  Yes Henrietta Hoover, NP  gabapentin (NEURONTIN) 300 MG capsule Take 1 capsule (300 mg total) by mouth 2 (two) times daily. 04/09/15  Yes Henrietta Hoover, NP  levothyroxine (SYNTHROID, LEVOTHROID) 25 MCG tablet Take 1 tablet (25 mcg total) by mouth daily before breakfast. 04/09/15  Yes Henrietta Hoover, NP  linagliptin (TRADJENTA) 5 MG TABS tablet Take 1 tablet (5 mg total) by mouth daily. 10/01/15  Yes Henrietta Hoover, NP  Liraglutide (VICTOZA) 18 MG/3ML SOPN Inject 0.3 mLs (1.8 mg total) into the skin daily. 10/01/15  Yes Henrietta Hoover, NP  meloxicam (MOBIC) 15 MG tablet TAKE 1 TABLET(15 MG) BY MOUTH DAILY 10/16/15  Yes Henrietta Hoover, NP  metolazone (ZAROXOLYN) 5 MG tablet Take 1 tablet (5 mg total) by mouth daily. 30 mins prior to furosemide 05/02/15  Yes Henrietta Hoover, NP  omeprazole (PRILOSEC) 40 MG capsule Take 1 capsule (40 mg total) by mouth daily. 05/02/15  Yes Henrietta Hoover, NP  potassium chloride (MICRO-K) 10 MEQ CR capsule Take 2 capsules (20 mEq total) by mouth daily. 06/21/15  Yes Henrietta Hoover, NP  tiZANidine (ZANAFLEX) 4 MG capsule Take 4 mg by mouth 3 (three) times daily as needed for muscle spasms.   Yes Historical Provider, MD  benzonatate (TESSALON) 100 MG capsule Take 1 capsule (100 mg total) by mouth every 8 (eight) hours. Patient not taking: Reported on 10/18/2015 10/06/15   Ace Gins Sam, PA-C  furosemide (LASIX) 40 MG tablet Take 1 tablet (40 mg total) by mouth daily. Patient not taking: Reported on 10/18/2015 04/09/15   Henrietta Hoover, NP  guaiFENesin (MUCINEX) 600 MG 12 hr tablet Take 1 tablet (600 mg total) by mouth 2 (two) times daily as needed for to loosen phlegm. Patient not taking: Reported on 10/18/2015 10/06/15   Ace Gins Sam, PA-C  HYDROcodone-acetaminophen (NORCO/VICODIN) 5-325 MG per tablet Take 1-2 tablets by mouth every 4 (four) hours as needed for moderate pain. Patient not taking: Reported on 04/09/2015 03/30/15   Gilda Crease, MD  predniSONE (DELTASONE) 20 MG tablet Take 2 tablets (40 mg total) by mouth daily with breakfast. Patient not taking: Reported on 10/18/2015 10/06/15   Ace Gins Sam, PA-C  traMADol (ULTRAM) 50 MG tablet Take 1 tablet (50 mg total) by mouth every 6 (six) hours as needed. Patient not taking: Reported on  10/18/2015 06/08/15   Garlon Hatchet, PA-C   BP 102/71 mmHg  Pulse 86  Temp(Src) 99 F (37.2 C) (Oral)  Resp 16  SpO2 88% Physical Exam  Constitutional: She appears well-developed and well-nourished. No distress.  Awake, alert, nontoxic appearance  HENT:  Head: Normocephalic and atraumatic.  Mouth/Throat: Oropharynx is clear and moist. No oropharyngeal exudate.  Eyes: Conjunctivae are normal. No scleral icterus.  Neck: Normal range of motion. Neck supple.  Full ROM without pain  Cardiovascular: Normal rate, regular rhythm, normal heart sounds and intact distal pulses.   Pulmonary/Chest: Effort normal and breath sounds normal. No respiratory distress. She has no wheezes.  Equal chest expansion  Abdominal: Soft. Bowel sounds are normal. She exhibits no distension and no mass. There is no tenderness. There is no rebound and no guarding.  Musculoskeletal: Normal range of motion. She exhibits no edema.  Full range of motion of the T-spine and L-spine No tenderness to palpation of  the spinous processes of the T-spine or L-spine No tenderness to palpation of the paraspinous muscles of the L-spine No erythema or joint swelling noted  Lymphadenopathy:    She has no cervical adenopathy.  Neurological: She is alert. She has normal reflexes.  Reflex Scores:      Bicep reflexes are 2+ on the right side and 2+ on the left side.      Brachioradialis reflexes are 2+ on the right side and 2+ on the left side.      Patellar reflexes are 2+ on the right side and 2+ on the left side.      Achilles reflexes are 2+ on the right side and 2+ on the left side. Speech is clear and goal oriented, follows commands Normal 5/5 strength in upper and lower extremities bilaterally including dorsiflexion and plantar flexion, strong and equal grip strength Sensation normal to light and sharp touch Moves extremities without ataxia, coordination intact Normal gait Normal balance  Skin: Skin is warm and dry. No  rash noted. She is not diaphoretic. No erythema.  Psychiatric: She has a normal mood and affect. Her behavior is normal.  Nursing note and vitals reviewed.   ED Course  Procedures (including critical care time) Labs Review Labs Reviewed  BASIC METABOLIC PANEL  CBC  I-STAT CHEM 8, ED    MDM   Final diagnoses:  Hypokalemia  Muscle spasm   Tabitha Scott presents with muscle spasms.  Chem 8 (not crossing over) shows K+ < 2.0.  Labs drawn and K+ repletion ordered.  Pt will need admission.  Pain control and zanaflex given.  Pt reports compliance with both lasix and potassium supplement.    1:48 AM  At shift change, care transferred to Dr. Anitra Lauth who will admit when labs and ECG have resulted.     Dahlia Client Margean Korell, PA-C 10/18/15 1610  Gwyneth Sprout, MD 10/18/15 [phone]

## 2015-10-18 NOTE — ED Notes (Signed)
Pt CBG,123.

## 2015-10-18 NOTE — ED Notes (Signed)
Phlebotomy at bedside.

## 2015-10-19 DIAGNOSIS — N179 Acute kidney failure, unspecified: Secondary | ICD-10-CM | POA: Diagnosis not present

## 2015-10-19 DIAGNOSIS — E876 Hypokalemia: Secondary | ICD-10-CM | POA: Diagnosis not present

## 2015-10-19 DIAGNOSIS — E119 Type 2 diabetes mellitus without complications: Secondary | ICD-10-CM | POA: Diagnosis not present

## 2015-10-19 DIAGNOSIS — E038 Other specified hypothyroidism: Secondary | ICD-10-CM

## 2015-10-19 DIAGNOSIS — R05 Cough: Secondary | ICD-10-CM | POA: Diagnosis not present

## 2015-10-19 LAB — CBC
HCT: 34.1 % — ABNORMAL LOW (ref 36.0–46.0)
Hemoglobin: 11 g/dL — ABNORMAL LOW (ref 12.0–15.0)
MCH: 29.6 pg (ref 26.0–34.0)
MCHC: 32.3 g/dL (ref 30.0–36.0)
MCV: 91.7 fL (ref 78.0–100.0)
PLATELETS: 193 10*3/uL (ref 150–400)
RBC: 3.72 MIL/uL — ABNORMAL LOW (ref 3.87–5.11)
RDW: 16.9 % — AB (ref 11.5–15.5)
WBC: 9.1 10*3/uL (ref 4.0–10.5)

## 2015-10-19 LAB — BASIC METABOLIC PANEL
Anion gap: 12 (ref 5–15)
BUN: 6 mg/dL (ref 6–20)
CHLORIDE: 98 mmol/L — AB (ref 101–111)
CO2: 30 mmol/L (ref 22–32)
Calcium: 8.4 mg/dL — ABNORMAL LOW (ref 8.9–10.3)
Creatinine, Ser: 0.98 mg/dL (ref 0.44–1.00)
GFR calc Af Amer: >60 mL/min (ref >60)
GFR calc non Af Amer: >60 mL/min (ref >60)
GLUCOSE: 112 mg/dL — AB (ref 65–99)
POTASSIUM: 3.2 mmol/L — AB (ref 3.5–5.1)
SODIUM: 140 mmol/L (ref 135–145)

## 2015-10-19 LAB — MAGNESIUM: MAGNESIUM: 2.1 mg/dL (ref 1.7–2.4)

## 2015-10-19 LAB — GLUCOSE, CAPILLARY
Glucose-Capillary: 101 mg/dL — ABNORMAL HIGH (ref 65–99)
Glucose-Capillary: 94 mg/dL (ref 65–99)

## 2015-10-19 MED ORDER — MUPIROCIN 2 % EX OINT: TOPICAL_OINTMENT | Freq: Two times a day (BID) | CUTANEOUS | Status: DC

## 2015-10-19 MED ORDER — LIRAGLUTIDE 18 MG/3ML ~~LOC~~ SOPN: 1.8000 mg | PEN_INJECTOR | Freq: Every day | SUBCUTANEOUS | Status: DC

## 2015-10-19 MED ORDER — TIZANIDINE HCL 4 MG PO CAPS: 4.0000 mg | ORAL_CAPSULE | Freq: Three times a day (TID) | ORAL | Status: DC | PRN

## 2015-10-19 MED ORDER — CHLORHEXIDINE GLUCONATE CLOTH 2 % EX PADS: 6.0000 | MEDICATED_PAD | Freq: Every day | CUTANEOUS | Status: DC

## 2015-10-19 MED ORDER — POTASSIUM CHLORIDE CRYS ER 20 MEQ PO TBCR: EXTENDED_RELEASE_TABLET | ORAL | Status: DC

## 2015-10-19 NOTE — Progress Notes (Signed)
Inpatient Diabetes Program Recommendations  AACE/ADA: New Consensus Statement on Inpatient Glycemic Control (2015)  Target Ranges:  Prepandial:   less than 140 mg/dL      Peak postprandial:   less than 180 mg/dL (1-2 hours)      Critically ill patients:  140 - 180 mg/dL   Results for Tabitha Scott, Tabitha Scott (MRN [id number]) as of 10/19/2015 09:35  Ref. Range 10/18/2015 07:55 10/18/2015 12:30 10/18/2015 17:57 10/18/2015 22:09 10/19/2015 08:59  Glucose-Capillary Latest Ref Range: 65-99 mg/dL 829 (H) 94 562 (H) 130 (H) 101 (H)   Review of Glycemic Control One glucose 204 (3 units of Novolog given) but all other 131 and lower since admission and has not required insulin correction.  Consult for DM and insulin teaching. Hx of DM 2, takes Victoza 1.8 mg Daily and Tradjenta 5 mg Daily. Last A1c 5.6% on 09/24/2015. Patient is very well controlled at home per last A1c. Note A1c not obtained this admission. Will sign off consult and follow patient while here.  Thanks,  Christena Deem RN, MSN, [facility] Inpatient Diabetes Coordinator Team Pager [phone] (8a-5p)

## 2015-10-19 NOTE — Discharge Summary (Signed)
Discharge Summary  Tabitha Scott NWG:[id number] DOB: Aug 31, 1965  PCP: MATTHEWS,MICHELLE A., MD  Admit date: 10/17/2015 Discharge date: 10/19/2015  Time spent: <75mins  Recommendations for Outpatient Follow-up:  1. F/u with PMD within a week for hospital discharge follow up and repeat bmp/mag at follow up, follow adjust potassium supplement.  Discharge Diagnoses:  Active Hospital Problems   Diagnosis Date Noted  . Hypokalemia 10/18/2015  . ARF (acute renal failure) (HCC) 10/18/2015  . Diabetes mellitus type 2, controlled (HCC) 10/18/2015  . Cough   . Acute respiratory failure with hypoxia (HCC)   . OSA on CPAP   . Morbid obesity (HCC)   . Diabetes mellitus type 2, noninsulin dependent (HCC)   . Pure hypercholesterolemia 04/09/2015  . Hypothyroidism 04/09/2015    Resolved Hospital Problems   Diagnosis Date Noted Date Resolved  No resolved problems to display.    Discharge Condition: stable  Diet recommendation: heart healthy/carb modified  Filed Weights   10/18/15 1547  Weight: 134.5 kg (296 lb 8.3 oz)    History of present illness:  Tabitha Scott is a 50 y.o. female presents to the ER because of increasing cramps and spasms all over the body. Denies any nausea vomiting or diarrhea. Patient's potassium was found to be around 2. Patient states she was told of her depression was less than 2 yesterday. EKG shows normal sinus rhythm with QTC of 498 ms with U waves. Patient has been admitted for severe hypokalemia. Patient is on Lasix and metolazone probably causing her hypokalemia.   Hospital Course:  Principal Problem:   Hypokalemia Active Problems:   Hypothyroidism   Pure hypercholesterolemia   ARF (acute renal failure) (HCC)   Diabetes mellitus type 2, controlled (HCC)   Cough   Acute respiratory failure with hypoxia (HCC)   OSA on CPAP   Morbid obesity (HCC)   Diabetes mellitus type 2, noninsulin dependent (HCC)  1. Severe hypokalemia/hypomegnesemia with severe muscle  cramping  - probably from diuretic use. Patient denies any nausea vomiting or diarrhea. s/p aggressively replacement.. 2. Acute renal failure - Lasix and metolazone held , cr improved, 1.35 on admission, 0.98 at discharge 3. Diabetes mellitus type 2 , noninsulin dependent- we'll continue home medications victoza and tradjenta, a1c 5.7 4. Hypothyroidism on Synthroid. 5. Hyperlipidemia - continue statins. 6. Morbid obesity: life style modification 7. Sleep apnea: on cpap, on home oxygen prn 8. 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 9. Cough: cxr unremarkable, no fever, no leukocytosis. Was recently treated with a course of abx /with prednisone. Continue albuterol prn, reported h/o copd, never had formal evaluation, pmd to refer patient to have pft test.    Code Status: Full code.  Family Communication: Discussed with patient.  Disposition Plan: home on 2/25  Procedures:  none  Consultations:  none  Discharge Exam: BP 115/38 mmHg  Pulse 102  Temp(Src) 98.3 F (36.8 C) (Oral)  Resp 20  Ht 5\' 4"  (1.626 m)  Wt 134.5 kg (296 lb 8.3 oz)  BMI 50.87 kg/m2  SpO2 93%  General: aaox3, obese Cardiovascular: RRR Respiratory: CTABL Extremity: no edema  Discharge Instructions You were cared for by a hospitalist during your hospital stay. If you have any questions about your discharge medications or the care you received while you were in the hospital after you are discharged, you can call the unit and asked to speak with the hospitalist on call if the hospitalist that took care of you is not available. Once you are discharged, your primary  care physician will handle any further medical issues. Please note that NO REFILLS for any discharge medications will be authorized once you are discharged, as it is imperative that you return to your primary care physician (or establish a relationship with a primary care physician if you do not have one) for your aftercare  needs so that they can reassess your need for medications and monitor your lab values.      Discharge Instructions    Diet - low sodium heart healthy    Complete by:  As directed   Carb modified     Increase activity slowly    Complete by:  As directed             Medication List    STOP taking these medications        HYDROcodone-acetaminophen 5-325 MG tablet  Commonly known as:  NORCO/VICODIN     potassium chloride 10 MEQ CR capsule  Commonly known as:  MICRO-K     predniSONE 20 MG tablet  Commonly known as:  DELTASONE     traMADol 50 MG tablet  Commonly known as:  ULTRAM      TAKE these medications        albuterol 108 (90 Base) MCG/ACT inhaler  Commonly known as:  PROVENTIL HFA;VENTOLIN HFA  Inhale 1-2 puffs into the lungs every 6 (six) hours as needed for wheezing or shortness of breath.     atorvastatin 40 MG tablet  Commonly known as:  LIPITOR  Take 1 tablet (40 mg total) by mouth daily.     benzonatate 100 MG capsule  Commonly known as:  TESSALON  Take 1 capsule (100 mg total) by mouth every 8 (eight) hours.     Chlorhexidine Gluconate Cloth 2 % Pads  Apply 6 each topically daily at 6 (six) AM.     ferrous sulfate 325 (65 FE) MG tablet  Take 1 tablet (325 mg total) by mouth daily with breakfast.     furosemide 40 MG tablet  Commonly known as:  LASIX  Take 1 tablet (40 mg total) by mouth daily.     gabapentin 300 MG capsule  Commonly known as:  NEURONTIN  Take 1 capsule (300 mg total) by mouth 2 (two) times daily.     guaiFENesin 600 MG 12 hr tablet  Commonly known as:  MUCINEX  Take 1 tablet (600 mg total) by mouth 2 (two) times daily as needed for to loosen phlegm.     levothyroxine 25 MCG tablet  Commonly known as:  SYNTHROID, LEVOTHROID  Take 1 tablet (25 mcg total) by mouth daily before breakfast.     linagliptin 5 MG Tabs tablet  Commonly known as:  TRADJENTA  Take 1 tablet (5 mg total) by mouth daily.     Liraglutide 18 MG/3ML Sopn    Commonly known as:  VICTOZA  Inject 0.3 mLs (1.8 mg total) into the skin daily.     meloxicam 15 MG tablet  Commonly known as:  MOBIC  TAKE 1 TABLET(15 MG) BY MOUTH DAILY     metolazone 5 MG tablet  Commonly known as:  ZAROXOLYN  Take 1 tablet (5 mg total) by mouth daily. 30 mins prior to furosemide     mupirocin ointment 2 %  Commonly known as:  BACTROBAN  Place into the nose 2 (two) times daily.     omeprazole 40 MG capsule  Commonly known as:  PRILOSEC  Take 1 capsule (40 mg total) by mouth daily.  potassium chloride SA 20 MEQ tablet  Commonly known as:  K-DUR,KLOR-CON  Take potassium twice a day for the next three days, then once a day, repeat labs in one week, further dose adjustment per primary care physician     tiZANidine 4 MG capsule  Commonly known as:  ZANAFLEX  Take 1 capsule (4 mg total) by mouth 3 (three) times daily as needed for muscle spasms.       Allergies  Allergen Reactions  . Tetanus Toxoids Swelling   Follow-up Information    Follow up with MATTHEWS,MICHELLE A., MD In 1 week.   Specialty:  Internal Medicine   Why:  hospital discharge follow up, repeat bmp/mag at follow up, discuss diabetes management, pmd to refer patient to have PFT done.   Contact information:   [address] [place] [address] [phone]        The results of significant diagnostics from this hospitalization (including imaging, microbiology, ancillary and laboratory) are listed below for reference.    Significant Diagnostic Studies: Dg Chest 2 View  10/18/2015  CLINICAL DATA:  Dry cough and shortness of breath for 1 week. EXAM: CHEST  2 VIEW COMPARISON:  Radiographs 10/06/2015 FINDINGS: The cardiomediastinal contours are normal. The lungs are clear. Pulmonary vasculature is normal. No consolidation, pleural effusion, or pneumothorax. No acute osseous abnormalities are seen. IMPRESSION: No acute pulmonary process. Electronically Signed   By:  Rubye Oaks M.D.   On: 10/18/2015 22:33   Dg Chest 2 View  10/06/2015  CLINICAL DATA:  Sore throat, cough EXAM: CHEST  2 VIEW COMPARISON:  None. FINDINGS: Lungs are clear.  No pleural effusion or pneumothorax. The heart is normal in size. Visualized osseous structures are within normal limits. Cholecystectomy clips. IMPRESSION: No evidence of acute cardiopulmonary disease. Electronically Signed   By: Charline Bills M.D.   On: 10/06/2015 18:27    Microbiology: Recent Results (from the past 240 hour(s))  MRSA PCR Screening     Status: Abnormal   Collection Time: 10/18/15  4:46 PM  Result Value Ref Range Status   MRSA by PCR POSITIVE (A) NEGATIVE Final    Comment:        The GeneXpert MRSA Assay (FDA approved for NASAL specimens only), is one component of a comprehensive MRSA colonization surveillance program. It is not intended to diagnose MRSA infection nor to guide or monitor treatment for MRSA infections. RESULT CALLED TO, READ BACK BY AND VERIFIED WITH: G.GLEASON,RN @1908  BY L.PITT 10/18/15      Labs: Basic Metabolic Panel:  Recent Labs Lab 10/18/15 0218 10/18/15 0545 10/18/15 0859 10/18/15 1504 10/19/15 0654  NA 136 135 135 137 140  K 2.0* 2.8* 2.7* 2.9* 3.2*  CL 84* 90* 91* 95* 98*  CO2 34* 34* 31 31 30   GLUCOSE 113* 115* 130* 126* 112*  BUN 10 9 8 8 6   CREATININE 1.35* 1.31* 1.22* 1.18* 0.98  CALCIUM 8.3* 7.6* 7.5* 7.9* 8.4*  MG  --   --  1.4* 2.0 2.1   Liver Function Tests: No results for input(s): AST, ALT, ALKPHOS, BILITOT, PROT, ALBUMIN in the last 168 hours. No results for input(s): LIPASE, AMYLASE in the last 168 hours. No results for input(s): AMMONIA in the last 168 hours. CBC:  Recent Labs Lab 10/18/15 0218 10/18/15 0545 10/19/15 0654  WBC 13.4* 11.2* 9.1  HGB 11.6* 10.5* 11.0*  HCT 36.2 32.9* 34.1*  MCV 89.4 88.4 91.7  PLT 232 203 193  Cardiac Enzymes: No results for input(s): CKTOTAL, CKMB, CKMBINDEX, TROPONINI in the last 168  hours. BNP: BNP (last 3 results)  Recent Labs  03/12/15 1655 03/30/15 1357  BNP 98.7 153.8*    ProBNP (last 3 results) No results for input(s): PROBNP in the last 8760 hours.  CBG:  Recent Labs Lab 10/18/15 1230 10/18/15 1757 10/18/15 2209 10/19/15 0859 10/19/15 1230  GLUCAP 94 204* 131* 101* 94       Signed:  Rydge Texidor MD, PhD  [facility] 10/19/2015, 3:43 PM

## 2015-10-19 NOTE — Progress Notes (Signed)
Pt given discharge instructions, prescriptions, and care notes. Pt verbalized understanding AEB no further questions or concerns at this time. IV was discontinued, no redness, pain, or swelling noted at this time. Telemetry discontinued and Centralized Telemetry was notified. Pt left the floor via wheelchair with staff in stable condition. 

## 2015-10-22 ENCOUNTER — Other Ambulatory Visit: Payer: Self-pay

## 2015-10-22 ENCOUNTER — Telehealth: Payer: Self-pay

## 2015-10-22 MED ORDER — ATORVASTATIN CALCIUM 40 MG PO TABS: 40.0000 mg | ORAL_TABLET | Freq: Every day | ORAL | Status: DC

## 2015-10-22 NOTE — Telephone Encounter (Signed)
Tabitha Scott, Patient was recently discharged from hospital. She sates the doctor in the hospital wanted her to have her potassium level checked in 1 week. Can you order this? She has an appointment the end of March. Please advise. Thanks!

## 2015-10-22 NOTE — Telephone Encounter (Signed)
Refill for lipitor sent into pharmacy. Thanks!  

## 2015-10-24 ENCOUNTER — Other Ambulatory Visit: Payer: Self-pay | Admitting: Family Medicine

## 2015-10-24 ENCOUNTER — Other Ambulatory Visit: Payer: Self-pay

## 2015-10-24 DIAGNOSIS — E138 Other specified diabetes mellitus with unspecified complications: Secondary | ICD-10-CM

## 2015-10-24 DIAGNOSIS — E876 Hypokalemia: Secondary | ICD-10-CM

## 2015-10-24 MED ORDER — LIRAGLUTIDE 18 MG/3ML ~~LOC~~ SOPN
1.8000 mg | PEN_INJECTOR | Freq: Every day | SUBCUTANEOUS | Status: DC
Start: 2015-10-24 — End: 2016-07-23

## 2015-10-24 MED ORDER — TIZANIDINE HCL 4 MG PO CAPS: 4.0000 mg | ORAL_CAPSULE | Freq: Three times a day (TID) | ORAL | Status: DC | PRN

## 2015-10-24 NOTE — Telephone Encounter (Signed)
Refill for victoza and tizanadine resent to correct pharmacy ([facility] ). Thanks!

## 2015-10-24 NOTE — Telephone Encounter (Signed)
Has been ordered. (cmet)

## 2015-11-01 ENCOUNTER — Other Ambulatory Visit: Payer: Medicaid Other

## 2015-11-01 ENCOUNTER — Encounter (INDEPENDENT_AMBULATORY_CARE_PROVIDER_SITE_OTHER): Payer: Self-pay

## 2015-11-01 DIAGNOSIS — E876 Hypokalemia: Secondary | ICD-10-CM

## 2015-11-01 LAB — COMPLETE METABOLIC PANEL WITH GFR
ALT: 32 U/L — AB (ref 6–29)
AST: 40 U/L — ABNORMAL HIGH (ref 10–35)
Albumin: 3.7 g/dL (ref 3.6–5.1)
Alkaline Phosphatase: 79 U/L (ref 33–130)
BUN: 13 mg/dL (ref 7–25)
CHLORIDE: 90 mmol/L — AB (ref 98–110)
CO2: 38 mmol/L — AB (ref 20–31)
CREATININE: 1.08 mg/dL — AB (ref 0.50–1.05)
Calcium: 8.9 mg/dL (ref 8.6–10.4)
GFR, EST AFRICAN AMERICAN: 69 mL/min (ref >=60)
GFR, Est Non African American: 60 mL/min (ref >=60)
Glucose, Bld: 106 mg/dL — ABNORMAL HIGH (ref 65–99)
POTASSIUM: 3.2 mmol/L — AB (ref 3.5–5.3)
Sodium: 140 mmol/L (ref 135–146)
Total Bilirubin: 0.9 mg/dL (ref 0.2–1.2)
Total Protein: 7.3 g/dL (ref 6.1–8.1)

## 2015-11-04 ENCOUNTER — Other Ambulatory Visit: Payer: Self-pay | Admitting: Family Medicine

## 2015-11-12 ENCOUNTER — Telehealth: Payer: Self-pay

## 2015-11-12 NOTE — Telephone Encounter (Signed)
Bonita QuinLinda,  Can you refill tizanidine for patient? Do you want her to be on this long term? Please advise. Thanks!

## 2015-11-12 NOTE — Telephone Encounter (Signed)
Pt called and stated that she picked up her prescription for Tizanidine yesterday and she said there were only 10 pills. She takes the medication 3x daily so she has questions about what to do

## 2015-11-18 ENCOUNTER — Ambulatory Visit (INDEPENDENT_AMBULATORY_CARE_PROVIDER_SITE_OTHER): Payer: Medicaid Other | Admitting: Family Medicine

## 2015-11-18 ENCOUNTER — Encounter: Payer: Self-pay | Admitting: Family Medicine

## 2015-11-18 VITALS — BP 128/63 | HR 97 | Temp 98.0°F | Ht 64.0 in | Wt 294.0 lb

## 2015-11-18 DIAGNOSIS — E876 Hypokalemia: Secondary | ICD-10-CM | POA: Diagnosis not present

## 2015-11-18 DIAGNOSIS — R591 Generalized enlarged lymph nodes: Secondary | ICD-10-CM | POA: Diagnosis not present

## 2015-11-18 DIAGNOSIS — J370 Chronic laryngitis: Secondary | ICD-10-CM | POA: Diagnosis not present

## 2015-11-18 DIAGNOSIS — E138 Other specified diabetes mellitus with unspecified complications: Secondary | ICD-10-CM

## 2015-11-18 DIAGNOSIS — R599 Enlarged lymph nodes, unspecified: Secondary | ICD-10-CM

## 2015-11-18 DIAGNOSIS — R87619 Unspecified abnormal cytological findings in specimens from cervix uteri: Secondary | ICD-10-CM

## 2015-11-18 NOTE — Progress Notes (Signed)
Patient ID: Tabitha Scott, female   DOB: Jan 17, 1966, 50 y.o.   MRN: [id number]   Tabitha Scott, is a 50 y.o. female  [id number]  [id number]  DOB - Jan 17, 1966  CC:  Chief Complaint  Patient presents with  . Diabetes    follow up, laryngitis persists, lump on left side of neck area x 1 month, some soreness noted       HPI: Tabitha Scott is a 50 y.o. female here for follow-up hospital visit about a month ago. She was diagnosed with hypokalemia, acute renal failure. Her last potassium was 3.2 on 3/10/ She has been taking potassium 40 2 times a day. In the hospital her K+ was 2. She also complains of laryngitis for several months. She also complains of enlarged lymph node in the left supraclavicular area. She reports that she had some type of abnormality on a PAP in 2016 and was supposed to follow up with GYN. She request that referral. She reports having a pneumonia vaccine in June of 2016 . She reports her colon cancer screening are also up to date. She has had HIV and Hep C screenings which were negative. She reports being allergic to Tdap.  Allergies  Allergen Reactions  . Tetanus Toxoids Swelling   Past Medical History  Diagnosis Date  . Neuropathy (HCC)   . Diabetes mellitus without complication (HCC)   . COPD (chronic obstructive pulmonary disease) (HCC)   . Hyperlipidemia   . Oxygen deficiency     prescribed but doesn't use regularly  . Thyroid disease    Current Outpatient Prescriptions on File Prior to Visit  Medication Sig Dispense Refill  . albuterol (PROVENTIL HFA;VENTOLIN HFA) 108 (90 Base) MCG/ACT inhaler Inhale 1-2 puffs into the lungs every 6 (six) hours as needed for wheezing or shortness of breath. 1 Inhaler 0  . atorvastatin (LIPITOR) 40 MG tablet Take 1 tablet (40 mg total) by mouth daily. 90 tablet 2  . benzonatate (TESSALON) 100 MG capsule Take 1 capsule (100 mg total) by mouth every 8 (eight) hours. (Patient not taking: Reported on 10/18/2015) 21 capsule 0  .  Chlorhexidine Gluconate Cloth 2 % PADS Apply 6 each topically daily at 6 (six) AM. 6 each 0  . ferrous sulfate 325 (65 FE) MG tablet Take 1 tablet (325 mg total) by mouth daily with breakfast. 30 tablet 3  . furosemide (LASIX) 40 MG tablet Take 1 tablet (40 mg total) by mouth daily. (Patient not taking: Reported on 10/18/2015) 90 tablet 3  . gabapentin (NEURONTIN) 300 MG capsule Take 1 capsule (300 mg total) by mouth 2 (two) times daily. 180 capsule 3  . guaiFENesin (MUCINEX) 600 MG 12 hr tablet Take 1 tablet (600 mg total) by mouth 2 (two) times daily as needed for to loosen phlegm. (Patient not taking: Reported on 10/18/2015) 14 tablet 0  . levothyroxine (SYNTHROID, LEVOTHROID) 25 MCG tablet Take 1 tablet (25 mcg total) by mouth daily before breakfast. 90 tablet 2  . linagliptin (TRADJENTA) 5 MG TABS tablet Take 1 tablet (5 mg total) by mouth daily. 90 tablet 3  . Liraglutide (VICTOZA) 18 MG/3ML SOPN Inject 0.3 mLs (1.8 mg total) into the skin daily. 6 mL prn  . meloxicam (MOBIC) 15 MG tablet TAKE 1 TABLET(15 MG) BY MOUTH DAILY 90 tablet 0  . metolazone (ZAROXOLYN) 5 MG tablet Take 1 tablet (5 mg total) by mouth daily. 30 mins prior to furosemide 90 tablet 3  . mupirocin ointment (BACTROBAN) 2 % Place into the  nose 2 (two) times daily. 22 g 0  . omeprazole (PRILOSEC) 40 MG capsule Take 1 capsule (40 mg total) by mouth daily. 90 capsule 3  . potassium chloride SA (K-DUR,KLOR-CON) 20 MEQ tablet Take potassium twice a day for the next three days, then once a day, repeat labs in one week, further dose adjustment per primary care physician 30 tablet 0  . tiZANidine (ZANAFLEX) 4 MG capsule Take 1 capsule (4 mg total) by mouth 3 (three) times daily as needed for muscle spasms. 10 capsule 0   No current facility-administered medications on file prior to visit.   Family History  Problem Relation Age of Onset  . COPD Mother   . Heart disease Father   . Cancer Maternal Grandmother    Social  History   Social History  . Marital Status: Single    Spouse Name: N/A  . Number of Children: N/A  . Years of Education: N/A   Occupational History  . Not on file.   Social History Main Topics  . Smoking status: Former Smoker    Quit date: 01/11/2015  . Smokeless tobacco: Not on file  . Alcohol Use: No  . Drug Use: No  . Sexual Activity: No   Other Topics Concern  . Not on file   Social History Narrative    Review of Systems: Constitutional: Negative for fever, chills, appetite change, weight loss,  Fatigue. Skin: Negative for rashes or lesions of concern. HENT: Negative for ear pain, ear discharge.nose bleeds, Positive for chronic laryngitis. Eyes: Negative for pain, discharge, redness, itching and visual disturbance. Neck: Negative for pain, stiffness Respiratory: Negative for cough. Positive for shortness of breath requiring albuterol occassionally shortness of breath,   Cardiovascular: Negative for chest pain, palpitations. Some intermittent leg swelling. Gastrointestinal: Negative for abdominal pain, nausea, vomiting, diarrhea, constipations Genitourinary: Negative for dysuria, urgency, frequency, hematuria,  Musculoskeletal: Positive for left mid back pain. Megative for other joint pain, joint  swelling, and gait problem.Negative for weakness. Neurological: Negative for dizziness, tremors, seizures, syncope,   light-headedness, numbness and headaches.  Hematological: Negative for easy bruising or bleeding Psychiatric/Behavioral: Negative for depression, anxiety, decreased concentration, confusion   Objective:   Filed Vitals:   11/18/15 1029  BP: 128/63  Pulse: 97  Temp: 98 F (36.7 C)    Physical Exam: Constitutional: Patient appears well-developed and well-nourished. No distress. HENT: Normocephalic, atraumatic, External right and left ear normal. Oropharynx is clear and moist.  Eyes: Conjunctivae and EOM are normal. PERRLA, no scleral icterus. Neck:  Normal ROM. Neck supple. No lymphadenopathy, No thyromegaly. CVS: RRR, S1/S2 +, no murmurs, no gallops, no rubs Pulmonary: Effort and breath sounds normal, no stridor, rhonchi, wheezes, rales.  Abdominal: Soft. Normoactive BS,, no distension, tenderness, rebound or guarding.  Musculoskeletal: Normal range of motion. No edema and no tenderness.  Neuro: Alert.Normal muscle tone coordination. Non-focal Skin: Skin is warm and dry. No rash noted. Not diaphoretic. No erythema. No pallor. Psychiatric: Normal mood and affect. Behavior, judgment, thought content normal.  Lab Results  Component Value Date   WBC 9.1 10/19/2015   HGB 11.0* 10/19/2015   HCT 34.1* 10/19/2015   MCV 91.7 10/19/2015   PLT 193 10/19/2015   Lab Results  Component Value Date   CREATININE 1.08* 11/01/2015   BUN 13 11/01/2015   NA 140 11/01/2015   K 3.2* 11/01/2015   CL 90* 11/01/2015   CO2 38* 11/01/2015    Lab Results  Component Value Date  HGBA1C 5.6 09/24/2015   Lipid Panel     Component Value Date/Time   CHOL 164 09/24/2015 0904   TRIG 260* 09/24/2015 0904   HDL 31* 09/24/2015 0904   CHOLHDL 5.3* 09/24/2015 0904   VLDL 52* 09/24/2015 0904   LDLCALC 81 09/24/2015 0904       Assessment and plan:   1. Diabetes mellitus of other type with complication [facility])  - Ambulatory referral to Ophthalmology  2. Swollen lymph nodes  - CBC with Differential  3. Hypokalemia  - COMPLETE METABOLIC PANEL WITH GFR  4. Laryngitis, chronic  - Ambulatory referral to ENT  5. Abnormal cervical cells -Referral to GYN.  Follow-up one month.   The patient was given clear instructions to go to ER or return to medical center if symptoms don't improve, worsen or new problems develop. The patient verbalized understanding.    Henrietta Hoover FNP  11/18/2015, 11:21 AM

## 2015-11-19 LAB — COMPLETE METABOLIC PANEL WITH GFR
ALBUMIN: 3.9 g/dL (ref 3.6–5.1)
ALK PHOS: 83 U/L (ref 33–130)
ALT: 38 U/L — ABNORMAL HIGH (ref 6–29)
AST: 57 U/L — ABNORMAL HIGH (ref 10–35)
BILIRUBIN TOTAL: 1 mg/dL (ref 0.2–1.2)
BUN: 14 mg/dL (ref 7–25)
CO2: 36 mmol/L — AB (ref 20–31)
Calcium: 9.2 mg/dL (ref 8.6–10.4)
Chloride: 86 mmol/L — ABNORMAL LOW (ref 98–110)
Creat: 1.24 mg/dL — ABNORMAL HIGH (ref 0.50–1.05)
GFR, EST AFRICAN AMERICAN: 58 mL/min — AB (ref >=60)
GFR, EST NON AFRICAN AMERICAN: 50 mL/min — AB (ref >=60)
GLUCOSE: 96 mg/dL (ref 65–99)
POTASSIUM: 2.8 mmol/L — AB (ref 3.5–5.3)
SODIUM: 140 mmol/L (ref 135–146)
Total Protein: 7.9 g/dL (ref 6.1–8.1)

## 2015-11-19 LAB — CBC WITH DIFFERENTIAL/PLATELET
BASOS ABS: 0.1 10*3/uL (ref 0.0–0.1)
BASOS PCT: 1 % (ref 0–1)
EOS ABS: 0.2 10*3/uL (ref 0.0–0.7)
Eosinophils Relative: 2 % (ref 0–5)
HCT: 36.2 % (ref 36.0–46.0)
HEMOGLOBIN: 11.9 g/dL — AB (ref 12.0–15.0)
Lymphocytes Relative: 34 % (ref 12–46)
Lymphs Abs: 3.2 10*3/uL (ref 0.7–4.0)
MCH: 29 pg (ref 26.0–34.0)
MCHC: 32.9 g/dL (ref 30.0–36.0)
MCV: 88.1 fL (ref 78.0–100.0)
MONOS PCT: 8 % (ref 3–12)
MPV: 9.5 fL (ref 8.6–12.4)
Monocytes Absolute: 0.8 10*3/uL (ref 0.1–1.0)
NEUTROS ABS: 5.2 10*3/uL (ref 1.7–7.7)
NEUTROS PCT: 55 % (ref 43–77)
PLATELETS: 233 10*3/uL (ref 150–400)
RBC: 4.11 MIL/uL (ref 3.87–5.11)
RDW: 17.7 % — ABNORMAL HIGH (ref 11.5–15.5)
WBC: 9.5 10*3/uL (ref 4.0–10.5)

## 2015-11-25 ENCOUNTER — Telehealth: Payer: Self-pay

## 2015-11-25 ENCOUNTER — Observation Stay (HOSPITAL_COMMUNITY)
Admission: EM | Admit: 2015-11-25 | Discharge: 2015-11-27 | Disposition: A | Discharge status: Home / Self Care | Payer: Medicaid Other | Attending: Internal Medicine | Admitting: Internal Medicine

## 2015-11-25 ENCOUNTER — Encounter (HOSPITAL_COMMUNITY): Payer: Self-pay | Admitting: *Deleted

## 2015-11-25 ENCOUNTER — Emergency Department (HOSPITAL_COMMUNITY): Payer: Medicaid Other

## 2015-11-25 DIAGNOSIS — I10 Essential (primary) hypertension: Secondary | ICD-10-CM | POA: Diagnosis not present

## 2015-11-25 DIAGNOSIS — Z6841 Body Mass Index (BMI) 40.0 and over, adult: Secondary | ICD-10-CM | POA: Insufficient documentation

## 2015-11-25 DIAGNOSIS — E1169 Type 2 diabetes mellitus with other specified complication: Secondary | ICD-10-CM

## 2015-11-25 DIAGNOSIS — Z87891 Personal history of nicotine dependence: Secondary | ICD-10-CM | POA: Insufficient documentation

## 2015-11-25 DIAGNOSIS — E785 Hyperlipidemia, unspecified: Secondary | ICD-10-CM | POA: Insufficient documentation

## 2015-11-25 DIAGNOSIS — E876 Hypokalemia: Secondary | ICD-10-CM | POA: Diagnosis not present

## 2015-11-25 DIAGNOSIS — J441 Chronic obstructive pulmonary disease with (acute) exacerbation: Secondary | ICD-10-CM | POA: Diagnosis not present

## 2015-11-25 DIAGNOSIS — G4733 Obstructive sleep apnea (adult) (pediatric): Secondary | ICD-10-CM | POA: Diagnosis not present

## 2015-11-25 DIAGNOSIS — E119 Type 2 diabetes mellitus without complications: Secondary | ICD-10-CM

## 2015-11-25 DIAGNOSIS — E7800 Pure hypercholesterolemia, unspecified: Secondary | ICD-10-CM | POA: Diagnosis present

## 2015-11-25 DIAGNOSIS — Z9989 Dependence on other enabling machines and devices: Secondary | ICD-10-CM

## 2015-11-25 DIAGNOSIS — E1140 Type 2 diabetes mellitus with diabetic neuropathy, unspecified: Secondary | ICD-10-CM | POA: Diagnosis not present

## 2015-11-25 DIAGNOSIS — E039 Hypothyroidism, unspecified: Secondary | ICD-10-CM | POA: Diagnosis present

## 2015-11-25 LAB — CBC
HCT: 36.3 % (ref 36.0–46.0)
Hemoglobin: 11.7 g/dL — ABNORMAL LOW (ref 12.0–15.0)
MCH: 29.8 pg (ref 26.0–34.0)
MCHC: 32.2 g/dL (ref 30.0–36.0)
MCV: 92.6 fL (ref 78.0–100.0)
PLATELETS: 211 10*3/uL (ref 150–400)
RBC: 3.92 MIL/uL (ref 3.87–5.11)
RDW: 17.3 % — AB (ref 11.5–15.5)
WBC: 10.2 10*3/uL (ref 4.0–10.5)

## 2015-11-25 LAB — COMPREHENSIVE METABOLIC PANEL
ALT: 37 U/L (ref 14–54)
AST: 56 U/L — ABNORMAL HIGH (ref 15–41)
Albumin: 3.6 g/dL (ref 3.5–5.0)
Alkaline Phosphatase: 73 U/L (ref 38–126)
Anion gap: 13 (ref 5–15)
BUN: 11 mg/dL (ref 6–20)
CHLORIDE: 91 mmol/L — AB (ref 101–111)
CO2: 35 mmol/L — ABNORMAL HIGH (ref 22–32)
Calcium: 9.1 mg/dL (ref 8.9–10.3)
Creatinine, Ser: 1.21 mg/dL — ABNORMAL HIGH (ref 0.44–1.00)
GFR, EST AFRICAN AMERICAN: 59 mL/min — AB (ref >60)
GFR, EST NON AFRICAN AMERICAN: 51 mL/min — AB (ref >60)
Glucose, Bld: 147 mg/dL — ABNORMAL HIGH (ref 65–99)
POTASSIUM: 2.3 mmol/L — AB (ref 3.5–5.1)
SODIUM: 139 mmol/L (ref 135–145)
Total Bilirubin: 1 mg/dL (ref 0.3–1.2)
Total Protein: 7.9 g/dL (ref 6.5–8.1)

## 2015-11-25 LAB — BASIC METABOLIC PANEL
ANION GAP: 13 (ref 5–15)
BUN: 10 mg/dL (ref 6–20)
CALCIUM: 8.6 mg/dL — AB (ref 8.9–10.3)
CHLORIDE: 95 mmol/L — AB (ref 101–111)
CO2: 30 mmol/L (ref 22–32)
CREATININE: 1.1 mg/dL — AB (ref 0.44–1.00)
GFR, EST NON AFRICAN AMERICAN: 57 mL/min — AB (ref >60)
Glucose, Bld: 127 mg/dL — ABNORMAL HIGH (ref 65–99)
POTASSIUM: 3.1 mmol/L — AB (ref 3.5–5.1)
Sodium: 138 mmol/L (ref 135–145)

## 2015-11-25 LAB — GLUCOSE, CAPILLARY: GLUCOSE-CAPILLARY: 125 mg/dL — AB (ref 65–99)

## 2015-11-25 LAB — TSH: TSH: 2.54 u[IU]/mL (ref 0.350–4.500)

## 2015-11-25 LAB — I-STAT TROPONIN, ED: TROPONIN I, POC: 0.01 ng/mL (ref 0.00–0.08)

## 2015-11-25 LAB — PHOSPHORUS: PHOSPHORUS: 3.2 mg/dL (ref 2.5–4.6)

## 2015-11-25 LAB — MAGNESIUM: Magnesium: 1.9 mg/dL (ref 1.7–2.4)

## 2015-11-25 LAB — CK: Total CK: 225 U/L (ref 38–234)

## 2015-11-25 MED ORDER — SODIUM CHLORIDE 0.9 % IV BOLUS (SEPSIS)
1000.0000 mL | Freq: Once | INTRAVENOUS | Status: AC
  Administered 2015-11-25: 1000 mL via INTRAVENOUS

## 2015-11-25 MED ORDER — SODIUM CHLORIDE 0.9% FLUSH
3.0000 mL | Freq: Two times a day (BID) | INTRAVENOUS | Status: DC
  Administered 2015-11-26 (×2): 3 mL via INTRAVENOUS

## 2015-11-25 MED ORDER — PANTOPRAZOLE SODIUM 40 MG PO TBEC
80.0000 mg | DELAYED_RELEASE_TABLET | Freq: Every day | ORAL | Status: DC
  Administered 2015-11-26: 80 mg via ORAL
  Filled 2015-11-25 (×2): qty 2

## 2015-11-25 MED ORDER — SODIUM CHLORIDE 0.9 % IV SOLN
INTRAVENOUS | Status: DC
  Administered 2015-11-25: 23:00:00 via INTRAVENOUS

## 2015-11-25 MED ORDER — MAGNESIUM SULFATE 2 GM/50ML IV SOLN
2.0000 g | Freq: Once | INTRAVENOUS | Status: AC
  Administered 2015-11-25: 2 g via INTRAVENOUS
  Filled 2015-11-25: qty 50

## 2015-11-25 MED ORDER — POTASSIUM CHLORIDE IN NACL 40-0.9 MEQ/L-% IV SOLN
INTRAVENOUS | Status: DC
  Filled 2015-11-25 (×2): qty 1000

## 2015-11-25 MED ORDER — POTASSIUM CHLORIDE CRYS ER 20 MEQ PO TBCR
40.0000 meq | EXTENDED_RELEASE_TABLET | Freq: Once | ORAL | Status: AC
  Administered 2015-11-25: 40 meq via ORAL
  Filled 2015-11-25: qty 2

## 2015-11-25 MED ORDER — POTASSIUM CHLORIDE 10 MEQ/100ML IV SOLN
10.0000 meq | INTRAVENOUS | Status: AC
  Administered 2015-11-25 (×3): 10 meq via INTRAVENOUS
  Filled 2015-11-25 (×3): qty 100

## 2015-11-25 MED ORDER — TIZANIDINE HCL 4 MG PO TABS: 4.0000 mg | ORAL_TABLET | Freq: Three times a day (TID) | ORAL | Status: DC | PRN

## 2015-11-25 MED ORDER — LINAGLIPTIN 5 MG PO TABS
5.0000 mg | ORAL_TABLET | Freq: Every day | ORAL | Status: DC
  Administered 2015-11-26: 5 mg via ORAL
  Filled 2015-11-25: qty 1

## 2015-11-25 MED ORDER — LIRAGLUTIDE 18 MG/3ML ~~LOC~~ SOPN: 1.8000 mg | PEN_INJECTOR | Freq: Every day | SUBCUTANEOUS | Status: DC

## 2015-11-25 MED ORDER — LEVOTHYROXINE SODIUM 25 MCG PO TABS
25.0000 ug | ORAL_TABLET | Freq: Every day | ORAL | Status: DC
  Administered 2015-11-26 – 2015-11-27 (×2): 25 ug via ORAL
  Filled 2015-11-25 (×2): qty 1

## 2015-11-25 MED ORDER — MELOXICAM 7.5 MG PO TABS
15.0000 mg | ORAL_TABLET | Freq: Every day | ORAL | Status: DC
  Administered 2015-11-26: 15 mg via ORAL
  Filled 2015-11-25: qty 1

## 2015-11-25 MED ORDER — ENOXAPARIN SODIUM 40 MG/0.4ML ~~LOC~~ SOLN
40.0000 mg | SUBCUTANEOUS | Status: DC
  Administered 2015-11-25 – 2015-11-26 (×2): 40 mg via SUBCUTANEOUS
  Filled 2015-11-25 (×2): qty 0.4

## 2015-11-25 MED ORDER — POTASSIUM CHLORIDE IN NACL 40-0.9 MEQ/L-% IV SOLN
INTRAVENOUS | Status: DC
  Administered 2015-11-25: 100 mL/h via INTRAVENOUS
  Filled 2015-11-25 (×2): qty 1000

## 2015-11-25 MED ORDER — POTASSIUM CHLORIDE CRYS ER 20 MEQ PO TBCR
40.0000 meq | EXTENDED_RELEASE_TABLET | Freq: Two times a day (BID) | ORAL | Status: DC
  Administered 2015-11-25: 40 meq via ORAL
  Filled 2015-11-25: qty 2

## 2015-11-25 MED ORDER — GABAPENTIN 300 MG PO CAPS
300.0000 mg | ORAL_CAPSULE | Freq: Two times a day (BID) | ORAL | Status: DC
  Administered 2015-11-25 – 2015-11-26 (×3): 300 mg via ORAL
  Filled 2015-11-25 (×4): qty 1

## 2015-11-25 MED ORDER — ALBUTEROL SULFATE (2.5 MG/3ML) 0.083% IN NEBU
2.5000 mg | INHALATION_SOLUTION | Freq: Four times a day (QID) | RESPIRATORY_TRACT | Status: DC | PRN
  Administered 2015-11-26: 2.5 mg via RESPIRATORY_TRACT
  Filled 2015-11-25: qty 3

## 2015-11-25 NOTE — ED Notes (Signed)
Notified Xray that pt is ready for transport

## 2015-11-25 NOTE — ED Notes (Signed)
Reported low potassium of. 2.3 to Dr. Denton LankSteinl and Charge RN, [facility]

## 2015-11-25 NOTE — ED Notes (Signed)
[facility]end[facility] pt.s Potassium is 2.8 .  She  Is here to have labs redrawn.

## 2015-11-25 NOTE — Telephone Encounter (Signed)
Patient has not shown up for blood work. Called her again and this time call went to her voicemail. Message left for her to call us and let us know if she is coming soon for stat Potassium level, if she doesn't arrive soon she may need to go to ER per Concepcion LivingLinda Bernhardt, NP.

## 2015-11-25 NOTE — H&P (Signed)
[facility] History and Physical  [name] ZOX:[id number] DOB: 23-Jul-1966 DOA: 11/25/2015  Referring physician: Chaney Malling, M.D. PCP: [name], NP   Chief Complaint: Low potassium.  HPI: Tabitha Scott is a 50 y.o. female with with a past medical history of neuropathy, type 2 diabetes, COPD, hyperlipidemia who returns to the hospital after being admitted and discharged about 5 weeks ago with hypokalemia.  The patient states that since then she has been follow-up of the clinic and last week her potassium levels 2.8 mmol per liter despite she has been taking potassium replacement daily. Today, she was asked by her PCP to come to the emergency department for a lab recheck. Potassium levels results today are 2.3 mmol per liter. She denies nausea, emesis or diarrhea. She has stopped the furosemide and metolazone since then. However, she states that she has been having persistent painful muscle cramps despite stopping the diuretics and taking potassium replacement.  She is being admitted to observation for cardiac telemetry and potassium replacement.   Review of Systems:  Constitutional:   Positive fatigue.  No weight loss, night sweats, Fevers, chills HEENT:  No headaches, Difficulty swallowing,Tooth/dental problems,Sore throat,  No sneezing, itching, ear ache, nasal congestion, post nasal drip,  Cardio-vascular:  No chest pain, Orthopnea, PND, swelling in lower extremities, anasarca, dizziness, palpitations  GI:  No heartburn, indigestion, abdominal pain, nausea, vomiting, diarrhea, change in bowel habits, loss of appetite  Resp:  No shortness of breath with exertion or at rest. No excess mucus, no productive cough, No non-productive cough, No coughing up of blood.No change in color of mucus.No wheezing.No chest wall deformity  Skin:  no rash or lesions.  GU:  no dysuria, change in color of urine, no urgency or frequency. No flank pain.  Musculoskeletal:  Positive muscle  cramps. No joint pain or swelling. No decreased range of motion. No back pain.  Psych:  No change in mood or affect. No depression or anxiety. No memory loss.   Past Medical History  Diagnosis Date  . Neuropathy (HCC)   . Diabetes mellitus without complication (HCC)   . COPD (chronic obstructive pulmonary disease) (HCC)   . Hyperlipidemia   . Oxygen deficiency     prescribed but doesn't use regularly  . Thyroid disease    Past Surgical History  Procedure Laterality Date  . Cholecystectomy    . Knee surgery      tissue removed, 2011?  Marland Kitchen Arm surgery  2009    nerver repair to both arms   . Carpal tunnel release  2009   Social History:  reports that she quit smoking about 10 months ago. She does not have any smokeless tobacco history on file. She reports that she does not drink alcohol or use illicit drugs.  Allergies  Allergen Reactions  . Tetanus Toxoids Swelling    Family History  Problem Relation Age of Onset  . COPD Mother   . Heart disease Father   . Cancer Maternal Grandmother     Prior to Admission medications   Medication Sig Start Date End Date Taking? Authorizing Provider  albuterol (PROVENTIL HFA;VENTOLIN HFA) 108 (90 Base) MCG/ACT inhaler Inhale 1-2 puffs into the lungs every 6 (six) hours as needed for wheezing or shortness of breath. 10/06/15   Ace Gins Sam, PA-C  atorvastatin (LIPITOR) 40 MG tablet Take 1 tablet (40 mg total) by mouth daily. 10/22/15   Henrietta Hoover, NP  benzonatate (TESSALON) 100 MG capsule Take 1 capsule (100 mg  total) by mouth every 8 (eight) hours. Patient not taking: Reported on 10/18/2015 10/06/15   Ace GinsSerena Y Sam, PA-C  Chlorhexidine Gluconate Cloth 2 % PADS Apply 6 each topically daily at 6 (six) AM. 10/19/15   Albertine GratesFang Xu, MD  ferrous sulfate 325 (65 FE) MG tablet Take 1 tablet (325 mg total) by mouth daily with breakfast. 08/22/15   Henrietta HooverLinda C Bernhardt, NP  furosemide (LASIX) 40 MG tablet Take 1 tablet (40 mg total) by mouth daily. Patient  not taking: Reported on 10/18/2015 04/09/15   Henrietta HooverLinda C Bernhardt, NP  gabapentin (NEURONTIN) 300 MG capsule Take 1 capsule (300 mg total) by mouth 2 (two) times daily. 04/09/15   Henrietta HooverLinda C Bernhardt, NP  guaiFENesin (MUCINEX) 600 MG 12 hr tablet Take 1 tablet (600 mg total) by mouth 2 (two) times daily as needed for to loosen phlegm. Patient not taking: Reported on 10/18/2015 10/06/15   Ace GinsSerena Y Sam, PA-C  levothyroxine (SYNTHROID, LEVOTHROID) 25 MCG tablet Take 1 tablet (25 mcg total) by mouth daily before breakfast. 04/09/15   Henrietta HooverLinda C Bernhardt, NP  linagliptin (TRADJENTA) 5 MG TABS tablet Take 1 tablet (5 mg total) by mouth daily. 10/01/15   Henrietta HooverLinda C Bernhardt, NP  Liraglutide (VICTOZA) 18 MG/3ML SOPN Inject 0.3 mLs (1.8 mg total) into the skin daily. 10/24/15   Henrietta HooverLinda C Bernhardt, NP  meloxicam (MOBIC) 15 MG tablet TAKE 1 TABLET(15 MG) BY MOUTH DAILY 10/16/15   Henrietta HooverLinda C Bernhardt, NP  metolazone (ZAROXOLYN) 5 MG tablet Take 1 tablet (5 mg total) by mouth daily. 30 mins prior to furosemide 05/02/15   Henrietta HooverLinda C Bernhardt, NP  mupirocin ointment (BACTROBAN) 2 % Place into the nose 2 (two) times daily. 10/19/15   Albertine GratesFang Xu, MD  omeprazole (PRILOSEC) 40 MG capsule Take 1 capsule (40 mg total) by mouth daily. 05/02/15   Henrietta HooverLinda C Bernhardt, NP  potassium chloride SA (K-DUR,KLOR-CON) 20 MEQ tablet Take potassium 40meq twice a day for the next three days, then 40meg once a day, repeat labs in one week, further dose adjustment per primary care physician 10/19/15   Albertine GratesFang Xu, MD  tiZANidine (ZANAFLEX) 4 MG capsule Take 1 capsule (4 mg total) by mouth 3 (three) times daily as needed for muscle spasms. 10/24/15   Henrietta HooverLinda C Bernhardt, NP   Physical Exam: Filed Vitals:   11/25/15 1800 11/25/15 1830 11/25/15 1845 11/25/15 1900  BP: 112/68 124/88 120/67 109/89  Pulse: 93 87 92 90  Temp:      TempSrc:      Resp: 21 22 13 24   SpO2: 96% 94% 97% 98%    Wt Readings from Last 3 Encounters:  11/18/15 133.358 kg (294 lb)  10/18/15 134.5 kg  (296 lb 8.3 oz)  08/15/15 136.986 kg (302 lb)    General:  Appears calm and comfortable Eyes: PERRL, normal lids, irises & conjunctiva ENT: grossly normal hearing, lips & tongue Neck: no LAD, masses or thyromegaly Cardiovascular: RRR, no m/r/g. No LE edema. Telemetry: SR, no arrhythmias  Respiratory: CTA bilaterally, no w/r/r. Normal respiratory effort. Abdomen: Obese, soft, ntnd Skin: no rash or induration seen on limited exam Musculoskeletal: grossly normal tone BUE/BLE Psychiatric: grossly normal mood and affect, speech fluent and appropriate Neurologic: Awake, alert, oriented  4, grossly non-focal.          Labs on Admission:  Basic Metabolic Panel:  Recent Labs Lab 11/25/15 1526 11/25/15 1809  NA 139  --   K 2.3*  --   CL 91*  --  CO2 35*  --   GLUCOSE 147*  --   BUN 11  --   CREATININE 1.21*  --   CALCIUM 9.1  --   MG  --  1.9   Liver Function Tests:  Recent Labs Lab 11/25/15 1526  AST 56*  ALT 37  ALKPHOS 73  BILITOT 1.0  PROT 7.9  ALBUMIN 3.6   CBC:  Recent Labs Lab 11/25/15 1526  WBC 10.2  HGB 11.7*  HCT 36.3  MCV 92.6  PLT 211    BNP (last 3 results)  Recent Labs  03/12/15 1655 03/30/15 1357  BNP 98.7 153.8*    Radiological Exams on Admission: Dg Chest 2 View  11/25/2015  CLINICAL DATA:  Cough, hyperkalemia EXAM: CHEST  2 VIEW COMPARISON:  12/12/2015 FINDINGS: Minimal enlargement of cardiac silhouette. Mediastinal contours and pulmonary vascularity normal. Lungs clear. Minimal chronic peribronchial thickening. No pulmonary infiltrate, pleural effusion or pneumothorax. Bones unremarkable. IMPRESSION: Minimal chronic bronchitic changes without infiltrate Electronically Signed   By: Ulyses Southward M.D.   On: 11/25/2015 18:22    EKG: Independently reviewed. Vent. rate 91 BPM PR interval 163 ms QRS duration 101 ms QT/QTc 388/477 ms P-R-T axes 54 10 -1 Sinus rhythm Low voltage, precordial leads RSR' in V1 or V2, right VCD or  RVH Borderline T abnormalities, diffuse leads  Assessment/Plan Principal Problem:   Hypokalemia Admit to telemetry/observation Continue potassium replacement. Check magnesium level and replace given low potassium level. Check phosphorus level. Follow-up potassium level. I advised the patient to have more potassium replacement, or maybe discuss Using an ACE inhibitor for her hypertension given her history of diabetes.  Active Problems:   Hypothyroidism Continue levothyroxine. Follow-up TSH level.    Pure hypercholesterolemia Hold atorvastatin while the patient is having muscle cramps. Check total CK level.    OSA on CPAP Continue CPAP ventilation at night.    Morbid obesity (HCC) Continue lifestyle modifications.    Diabetes mellitus type 2, noninsulin dependent (HCC)  Carbohydrate modified diet. Continue Tradjenta and Victoza. CBG monitoring before meals.     Code Status: Full code. DVT Prophylaxis: Lovenox SQ. Family Communication:  Disposition Plan: Admit to replete potassium and monitor cardiac telemetry.i  Time spent: Over 70 minutes was spent in the process of his admission.   Bobette Mo, M.D. [facility] Pager [phone].

## 2015-11-25 NOTE — ED Notes (Addendum)
Pt was admitted in feb due to low potassium levels, has been taking potassium as directed. Reports needing labs drawn to recheck potassium level. K was 2.8 on 3/27.

## 2015-11-25 NOTE — ED Provider Notes (Signed)
CSN: [id number]     Arrival date & time 11/25/15  1439 History   First MD Initiated Contact with Patient 11/25/15 1640     Chief Complaint  Patient presents with  . Labs Only     (Consider location/radiation/quality/duration/timing/severity/associated sxs/prior Treatment) The history is provided by the patient.  Tabitha Scott is a 50 y.o. female hx of DM, COPD, here with hypokalemia. Patient has been having diffuse cramps for the last several months. She was admitted 2 months ago for hypokalemia secondary to lasix and metolazone. She has been off of these meds. Denies vomiting or diarrhea or fevers. Has been coughing for several months, recently quit smoking. Went to see PCP a week ago and K was 2.8 and was told to come to the ED for lab recheck.    Past Medical History  Diagnosis Date  . Neuropathy (HCC)   . Diabetes mellitus without complication (HCC)   . COPD (chronic obstructive pulmonary disease) (HCC)   . Hyperlipidemia   . Oxygen deficiency     prescribed but doesn't use regularly  . Thyroid disease    Past Surgical History  Procedure Laterality Date  . Cholecystectomy    . Knee surgery      tissue removed, 2011?  Marland Kitchen Arm surgery  2009    nerver repair to both arms   . Carpal tunnel release  2009   Family History  Problem Relation Age of Onset  . COPD Mother   . Heart disease Father   . Cancer Maternal Grandmother    Social History  Substance Use Topics  . Smoking status: Former Smoker    Quit date: 01/11/2015  . Smokeless tobacco: None  . Alcohol Use: No   OB History    No data available     Review of Systems  Respiratory: Positive for cough.   Musculoskeletal:       Leg and hand cramps   All other systems reviewed and are negative.     Allergies  Tetanus toxoids  Home Medications   Prior to Admission medications   Medication Sig Start Date End Date Taking? Authorizing Provider  albuterol (PROVENTIL HFA;VENTOLIN HFA) 108 (90 Base) MCG/ACT inhaler  Inhale 1-2 puffs into the lungs every 6 (six) hours as needed for wheezing or shortness of breath. 10/06/15   Ace Gins Sam, PA-C  atorvastatin (LIPITOR) 40 MG tablet Take 1 tablet (40 mg total) by mouth daily. 10/22/15   Henrietta Hoover, NP  benzonatate (TESSALON) 100 MG capsule Take 1 capsule (100 mg total) by mouth every 8 (eight) hours. Patient not taking: Reported on 10/18/2015 10/06/15   Ace Gins Sam, PA-C  Chlorhexidine Gluconate Cloth 2 % PADS Apply 6 each topically daily at 6 (six) AM. 10/19/15   Albertine Grates, MD  ferrous sulfate 325 (65 FE) MG tablet Take 1 tablet (325 mg total) by mouth daily with breakfast. 08/22/15   Henrietta Hoover, NP  furosemide (LASIX) 40 MG tablet Take 1 tablet (40 mg total) by mouth daily. Patient not taking: Reported on 10/18/2015 04/09/15   Henrietta Hoover, NP  gabapentin (NEURONTIN) 300 MG capsule Take 1 capsule (300 mg total) by mouth 2 (two) times daily. 04/09/15   Henrietta Hoover, NP  guaiFENesin (MUCINEX) 600 MG 12 hr tablet Take 1 tablet (600 mg total) by mouth 2 (two) times daily as needed for to loosen phlegm. Patient not taking: Reported on 10/18/2015 10/06/15   Ace Gins Sam, PA-C  levothyroxine (SYNTHROID, LEVOTHROID) 25  MCG tablet Take 1 tablet (25 mcg total) by mouth daily before breakfast. 04/09/15   Henrietta HooverLinda C Bernhardt, NP  linagliptin (TRADJENTA) 5 MG TABS tablet Take 1 tablet (5 mg total) by mouth daily. 10/01/15   Henrietta HooverLinda C Bernhardt, NP  Liraglutide (VICTOZA) 18 MG/3ML SOPN Inject 0.3 mLs (1.8 mg total) into the skin daily. 10/24/15   Henrietta HooverLinda C Bernhardt, NP  meloxicam (MOBIC) 15 MG tablet TAKE 1 TABLET(15 MG) BY MOUTH DAILY 10/16/15   Henrietta HooverLinda C Bernhardt, NP  metolazone (ZAROXOLYN) 5 MG tablet Take 1 tablet (5 mg total) by mouth daily. 30 mins prior to furosemide 05/02/15   Henrietta HooverLinda C Bernhardt, NP  mupirocin ointment (BACTROBAN) 2 % Place into the nose 2 (two) times daily. 10/19/15   Albertine GratesFang Xu, MD  omeprazole (PRILOSEC) 40 MG capsule Take 1 capsule (40 mg total) by mouth  daily. 05/02/15   Henrietta HooverLinda C Bernhardt, NP  potassium chloride SA (K-DUR,KLOR-CON) 20 MEQ tablet Take potassium 40meq twice a day for the next three days, then 40meg once a day, repeat labs in one week, further dose adjustment per primary care physician 10/19/15   Albertine GratesFang Xu, MD  tiZANidine (ZANAFLEX) 4 MG capsule Take 1 capsule (4 mg total) by mouth 3 (three) times daily as needed for muscle spasms. 10/24/15   Henrietta HooverLinda C Bernhardt, NP   BP 112/68 mmHg  Pulse 93  Temp(Src) 98.2 F (36.8 C) (Oral)  Resp 21  SpO2 96% Physical Exam  Constitutional: She is oriented to person, place, and time. She appears well-developed and well-nourished.  HENT:  Head: Normocephalic.  Mouth/Throat: Oropharynx is clear and moist.  Eyes: Conjunctivae are normal. Pupils are equal, round, and reactive to light.  Neck: Normal range of motion. Neck supple.  Cardiovascular: Normal rate, regular rhythm and normal heart sounds.   Pulmonary/Chest: Effort normal.  Mild diffuse wheezing, no crackles or retractions   Abdominal: Soft. Bowel sounds are normal. She exhibits no distension. There is no tenderness. There is no rebound.  Musculoskeletal: Normal range of motion. She exhibits no edema or tenderness.  No obvious muscle tenderness   Neurological: She is alert and oriented to person, place, and time.  Skin: Skin is warm and dry.  Psychiatric: She has a normal mood and affect. Her behavior is normal. Judgment and thought content normal.  Nursing note and vitals reviewed.   ED Course  Procedures (including critical care time) Labs Review Labs Reviewed  CBC - Abnormal; Notable for the following:    Hemoglobin 11.7 (*)    RDW 17.3 (*)    All other components within normal limits  COMPREHENSIVE METABOLIC PANEL - Abnormal; Notable for the following:    Potassium 2.3 (*)    Chloride 91 (*)    CO2 35 (*)    Glucose, Bld 147 (*)    Creatinine, Ser 1.21 (*)    AST 56 (*)    GFR calc non Af Amer 51 (*)    GFR calc Af Amer 59  (*)    All other components within normal limits  MAGNESIUM  TSH  I-STAT TROPOININ, ED    Imaging Review Dg Chest 2 View  11/25/2015  CLINICAL DATA:  Cough, hyperkalemia EXAM: CHEST  2 VIEW COMPARISON:  12/12/2015 FINDINGS: Minimal enlargement of cardiac silhouette. Mediastinal contours and pulmonary vascularity normal. Lungs clear. Minimal chronic peribronchial thickening. No pulmonary infiltrate, pleural effusion or pneumothorax. Bones unremarkable. IMPRESSION: Minimal chronic bronchitic changes without infiltrate Electronically Signed   By: Angelyn PuntMark  Boles M.D.  On: 11/25/2015 18:22   I have personally reviewed and evaluated these images and lab results as part of my medical decision-making.   EKG Interpretation   Date/Time:  Monday November 25 2015 17:05:08 EDT Ventricular Rate:  91 PR Interval:  163 QRS Duration: 101 QT Interval:  388 QTC Calculation: 477 R Axis:   10 Text Interpretation:  Sinus rhythm Low voltage, precordial leads RSR' in  V1 or V2, right VCD or RVH Borderline T abnormalities, diffuse leads No  significant change since last tracing Confirmed by YAO  MD, DAVID ([id number])  on 11/25/2015 5:07:04 PM      MDM   Final diagnoses:  None   Merrie Epler is a 50 y.o. female here with cough, cramps. I think likely hypokalemia again. No vomiting or diarrhea. Not on diuretics anymore. Will recheck chemistry, Magnesium level. Will reassess.  6:26 PM Patient's K 2.3. Ordered KCL runs x 3, Kdur. Will admit to tele obs.    Richardean Canal, MD 11/25/15 [phone]

## 2015-11-26 ENCOUNTER — Encounter (HOSPITAL_COMMUNITY): Payer: Self-pay | Admitting: General Practice

## 2015-11-26 DIAGNOSIS — E119 Type 2 diabetes mellitus without complications: Secondary | ICD-10-CM

## 2015-11-26 DIAGNOSIS — E876 Hypokalemia: Secondary | ICD-10-CM | POA: Diagnosis not present

## 2015-11-26 DIAGNOSIS — E038 Other specified hypothyroidism: Secondary | ICD-10-CM

## 2015-11-26 LAB — CBC WITH DIFFERENTIAL/PLATELET
BASOS PCT: 1 %
Basophils Absolute: 0 10*3/uL (ref 0.0–0.1)
Eosinophils Absolute: 0.2 10*3/uL (ref 0.0–0.7)
Eosinophils Relative: 4 %
HEMATOCRIT: 33.1 % — AB (ref 36.0–46.0)
HEMOGLOBIN: 10.2 g/dL — AB (ref 12.0–15.0)
LYMPHS ABS: 2.8 10*3/uL (ref 0.7–4.0)
Lymphocytes Relative: 42 %
MCH: 28.7 pg (ref 26.0–34.0)
MCHC: 30.8 g/dL (ref 30.0–36.0)
MCV: 93.2 fL (ref 78.0–100.0)
Monocytes Absolute: 0.4 10*3/uL (ref 0.1–1.0)
Monocytes Relative: 6 %
NEUTROS ABS: 3.2 10*3/uL (ref 1.7–7.7)
NEUTROS PCT: 47 %
PLATELETS: 172 10*3/uL (ref 150–400)
RBC: 3.55 MIL/uL — AB (ref 3.87–5.11)
RDW: 17.6 % — ABNORMAL HIGH (ref 11.5–15.5)
WBC: 6.7 10*3/uL (ref 4.0–10.5)

## 2015-11-26 LAB — COMPREHENSIVE METABOLIC PANEL
ALBUMIN: 2.9 g/dL — AB (ref 3.5–5.0)
ALT: 30 U/L (ref 14–54)
ANION GAP: 12 (ref 5–15)
AST: 42 U/L — AB (ref 15–41)
Alkaline Phosphatase: 65 U/L (ref 38–126)
BILIRUBIN TOTAL: 0.8 mg/dL (ref 0.3–1.2)
BUN: 8 mg/dL (ref 6–20)
CHLORIDE: 99 mmol/L — AB (ref 101–111)
CO2: 31 mmol/L (ref 22–32)
Calcium: 8.4 mg/dL — ABNORMAL LOW (ref 8.9–10.3)
Creatinine, Ser: 1.01 mg/dL — ABNORMAL HIGH (ref 0.44–1.00)
GFR calc Af Amer: >60 mL/min (ref >60)
GFR calc non Af Amer: >60 mL/min (ref >60)
GLUCOSE: 117 mg/dL — AB (ref 65–99)
POTASSIUM: 3.1 mmol/L — AB (ref 3.5–5.1)
SODIUM: 142 mmol/L (ref 135–145)
TOTAL PROTEIN: 6.5 g/dL (ref 6.5–8.1)

## 2015-11-26 LAB — MRSA PCR SCREENING: MRSA by PCR: POSITIVE — AB

## 2015-11-26 LAB — GLUCOSE, CAPILLARY
GLUCOSE-CAPILLARY: 122 mg/dL — AB (ref 65–99)
Glucose-Capillary: 101 mg/dL — ABNORMAL HIGH (ref 65–99)
Glucose-Capillary: 152 mg/dL — ABNORMAL HIGH (ref 65–99)
Glucose-Capillary: 171 mg/dL — ABNORMAL HIGH (ref 65–99)

## 2015-11-26 MED ORDER — INSULIN ASPART 100 UNIT/ML ~~LOC~~ SOLN
0.0000 [IU] | Freq: Three times a day (TID) | SUBCUTANEOUS | Status: DC
Start: 2015-11-26 — End: 2015-11-27
  Administered 2015-11-26: 2 [IU] via SUBCUTANEOUS
  Administered 2015-11-27: 1 [IU] via SUBCUTANEOUS

## 2015-11-26 MED ORDER — CHLORHEXIDINE GLUCONATE CLOTH 2 % EX PADS
6.0000 | MEDICATED_PAD | Freq: Every day | CUTANEOUS | Status: DC
  Administered 2015-11-26 – 2015-11-27 (×2): 6 via TOPICAL

## 2015-11-26 MED ORDER — SPIRONOLACTONE 25 MG PO TABS
12.5000 mg | ORAL_TABLET | Freq: Every day | ORAL | Status: DC
  Administered 2015-11-26: 12.5 mg via ORAL
  Filled 2015-11-26 (×2): qty 1

## 2015-11-26 MED ORDER — MUPIROCIN 2 % EX OINT
1.0000 "application " | TOPICAL_OINTMENT | Freq: Two times a day (BID) | CUTANEOUS | Status: DC
  Administered 2015-11-26 (×2): 1 via NASAL
  Filled 2015-11-26: qty 22

## 2015-11-26 MED ORDER — PREDNISONE 20 MG PO TABS
40.0000 mg | ORAL_TABLET | Freq: Every day | ORAL | Status: DC
  Administered 2015-11-26: 40 mg via ORAL
  Filled 2015-11-26 (×2): qty 2

## 2015-11-26 MED ORDER — POTASSIUM CHLORIDE CRYS ER 20 MEQ PO TBCR
40.0000 meq | EXTENDED_RELEASE_TABLET | Freq: Three times a day (TID) | ORAL | Status: DC
  Administered 2015-11-26 (×3): 40 meq via ORAL
  Filled 2015-11-26 (×3): qty 2

## 2015-11-26 NOTE — Progress Notes (Signed)
[facility] PROGRESS NOTE  Tabitha Scott QAS:[id number] DOB: 1965-11-24 DOA: 11/25/2015 PCP: Sharon Seller, NP  Assessment/Plan:  Hypokalemia;  Continue with K-cl 40 meq TID today. Need B-met in am to follow K level. Please adjust K-CL supplement accordiling. Patient on schedule potassium supplement.  I will start low dose spironolactone, will help with diuresis and hypokalemia.  Received IV mg 4-03.  COPD Exacerbation, mild flare.  She has bilateral wheezing.  Will start low dose prednisone.  PRN nebulizer. Notice albuterol can cause hypokalemia.   Hypothyroidism Continue levothyroxine. Follow-up TSH level.   Pure hypercholesterolemia Hold atorvastatin while the patient is having muscle cramps. total CK level normal   OSA on CPAP Continue CPAP  at night.   Morbid obesity ([place]) Continue lifestyle modifications.   Diabetes mellitus type 2, noninsulin dependent (HCC)  Carbohydrate modified diet. Hold Tradjenta and  On Victoza. CBG monitoring before meals. SSI   Code Status: Full Code.  Family Communication: care discussed with patient  Disposition Plan: home in 24 to 48 hours.    Consultants:  none  Procedures:  none  Antibiotics:  none  HPI/Subjective: She is feeling a little better, muscle pain, cramps has improved.  She denies diarrhea. She has not been taking diuretics since she was discharge from hospital last time.   Objective: Filed Vitals:   11/26/15 0638 11/26/15 0733  BP: 125/41 106/70  Pulse: 89 87  Temp: 98.1 F (36.7 C) 98 F (36.7 C)  Resp: 22 20    Intake/Output Summary (Last 24 hours) at 11/26/15 1246 Last data filed at 11/26/15 0902  Gross per 24 hour  Intake   2775 ml  Output    700 ml  Net   2075 ml   Filed Weights   11/25/15 2130 11/26/15 0638  Weight: 135.127 kg (297 lb 14.4 oz) 137.032 kg (302 lb 1.6 oz)    Exam:   General:  No acute distress  Cardiovascular: S 1, S 2 RRR  Respiratory: Bilateral  wheezing   Abdomen: BS present, soft,   Musculoskeletal: trace edema  Data Reviewed: Basic Metabolic Panel:  Recent Labs Lab 11/25/15 1526 11/25/15 1809 11/25/15 2225 11/26/15 0538  NA 139  --  138 142  K 2.3*  --  3.1* 3.1*  CL 91*  --  95* 99*  CO2 35*  --  30 31  GLUCOSE 147*  --  127* 117*  BUN 11  --  10 8  CREATININE 1.21*  --  1.10* 1.01*  CALCIUM 9.1  --  8.6* 8.4*  MG  --  1.9  --   --   PHOS  --   --  3.2  --    Liver Function Tests:  Recent Labs Lab 11/25/15 1526 11/26/15 0538  AST 56* 42*  ALT 37 30  ALKPHOS 73 65  BILITOT 1.0 0.8  PROT 7.9 6.5  ALBUMIN 3.6 2.9*   No results for input(s): LIPASE, AMYLASE in the last 168 hours. No results for input(s): AMMONIA in the last 168 hours. CBC:  Recent Labs Lab 11/25/15 1526 11/26/15 0538  WBC 10.2 6.7  NEUTROABS  --  3.2  HGB 11.7* 10.2*  HCT 36.3 33.1*  MCV 92.6 93.2  PLT 211 172   Cardiac Enzymes:  Recent Labs Lab 11/25/15 2137  CKTOTAL 225   BNP (last 3 results)  Recent Labs  03/12/15 1655 03/30/15 1357  BNP 98.7 153.8*    ProBNP (last 3 results) No results for input(s): PROBNP in the  last 8760 hours.  CBG:  Recent Labs Lab 11/25/15 2142 11/26/15 0657 11/26/15 1158  GLUCAP 125* 101* 122*    Recent Results (from the past 240 hour(s))  MRSA PCR Screening     Status: Abnormal   Collection Time: 11/25/15 10:56 PM  Result Value Ref Range Status   MRSA by PCR POSITIVE (A) NEGATIVE Final    Comment:        The GeneXpert MRSA Assay (FDA approved for NASAL specimens only), is one component of a comprehensive MRSA colonization surveillance program. It is not intended to diagnose MRSA infection nor to guide or monitor treatment for MRSA infections. RESULT CALLED TO, READ BACK BY AND VERIFIED WITH: A ZINOANARIN,RN _0  11/26/15 MKELLY      Studies: Dg Chest 2 View  11/25/2015  CLINICAL DATA:  Cough, hyperkalemia EXAM: CHEST  2 VIEW COMPARISON:  12/12/2015 FINDINGS:  Minimal enlargement of cardiac silhouette. Mediastinal contours and pulmonary vascularity normal. Lungs clear. Minimal chronic peribronchial thickening. No pulmonary infiltrate, pleural effusion or pneumothorax. Bones unremarkable. IMPRESSION: Minimal chronic bronchitic changes without infiltrate Electronically Signed   By: Lavonia Dana M.D.   On: 11/25/2015 18:22    Scheduled Meds: . Chlorhexidine Gluconate Cloth  6 each Topical Q0600  . enoxaparin (LOVENOX) injection  40 mg Subcutaneous Q24H  . gabapentin  300 mg Oral BID  . levothyroxine  25 mcg Oral QAC breakfast  . linagliptin  5 mg Oral Daily  . Liraglutide  1.8 mg Subcutaneous Daily  . mupirocin ointment  1 application Nasal BID  . pantoprazole  80 mg Oral Daily  . potassium chloride SA  40 mEq Oral TID  . predniSONE  40 mg Oral Q breakfast  . sodium chloride flush  3 mL Intravenous Q12H  . spironolactone  12.5 mg Oral Daily   Continuous Infusions:   Principal Problem:   Hypokalemia Active Problems:   Hypothyroidism   Pure hypercholesterolemia   OSA on CPAP   Morbid obesity (HCC)   Diabetes mellitus type 2, noninsulin dependent ([place])    Time spent: 35 minutes.     Niel Hummer A  [facility] Pager [phone]. If 7PM-7AM, please contact night-coverage at [email or web address], password [facility] 11/26/2015, 12:46 PM

## 2015-11-26 NOTE — Progress Notes (Signed)
   11/25/15 2130  Vitals  Temp 98.8 F (37.1 C)  Temp Source Oral  BP 117/76 mmHg  BP Location Right Arm  BP Method Automatic  Patient Position (if appropriate) Lying  Pulse Rate 93  Pulse Rate Source Dinamap  Resp 18  Oxygen Therapy  SpO2 97 %  O2 Device Room Air  Height and Weight  Height 5\' 4"  (1.626 m)  Weight 135.127 kg (297 lb 14.4 oz)  Type of Scale Used Standing  Type of Weight Actual  BSA (Calculated - sq m) 2.47 sq meters  BMI (Calculated) 51.2  Weight in (lb) to have BMI = 25 145.3  Admitted pt to rm 3E30 from ED, pt alert and oriented, denied pain at this time, oriented to room, call bell placed within reach, admission assessment done, orders carried out.

## 2015-11-27 DIAGNOSIS — E876 Hypokalemia: Secondary | ICD-10-CM | POA: Diagnosis not present

## 2015-11-27 DIAGNOSIS — E119 Type 2 diabetes mellitus without complications: Secondary | ICD-10-CM | POA: Diagnosis not present

## 2015-11-27 LAB — BASIC METABOLIC PANEL
Anion gap: 11 (ref 5–15)
BUN: 9 mg/dL (ref 6–20)
CHLORIDE: 100 mmol/L — AB (ref 101–111)
CO2: 30 mmol/L (ref 22–32)
Calcium: 9.1 mg/dL (ref 8.9–10.3)
Creatinine, Ser: 1.04 mg/dL — ABNORMAL HIGH (ref 0.44–1.00)
GFR calc non Af Amer: >60 mL/min (ref >60)
Glucose, Bld: 151 mg/dL — ABNORMAL HIGH (ref 65–99)
POTASSIUM: 4 mmol/L (ref 3.5–5.1)
SODIUM: 141 mmol/L (ref 135–145)

## 2015-11-27 LAB — GLUCOSE, CAPILLARY: GLUCOSE-CAPILLARY: 123 mg/dL — AB (ref 65–99)

## 2015-11-27 MED ORDER — SPIRONOLACTONE 25 MG PO TABS: 12.5000 mg | ORAL_TABLET | Freq: Every day | ORAL | Status: DC

## 2015-11-27 MED ORDER — POTASSIUM CHLORIDE ER 10 MEQ PO TBCR: 10.0000 meq | EXTENDED_RELEASE_TABLET | Freq: Every day | ORAL | Status: DC

## 2015-11-27 MED ORDER — PREDNISONE 20 MG PO TABS: 40.0000 mg | ORAL_TABLET | Freq: Every day | ORAL | Status: DC

## 2015-11-27 MED ORDER — POTASSIUM CHLORIDE CRYS ER 20 MEQ PO TBCR
EXTENDED_RELEASE_TABLET | ORAL | Status: DC
Start: 2015-11-27 — End: 2015-11-27

## 2015-11-27 NOTE — Discharge Summary (Signed)
Physician Discharge Summary  Devory Mckinzie VEH:[id number] DOB: 1965/12/25 DOA: 11/25/2015  PCP: Sharon Seller, NP  Admit date: 11/25/2015 Discharge date: 11/27/2015  Time spent: 35 minutes  Recommendations for Outpatient Follow-up:  1. Needs B-met to follow K level.  2. Consider resuming lasix when potassium stabilized.  3. Follow up on resolution of COPD flare.    Discharge Diagnoses:    Hypokalemia   Hypothyroidism   Pure hypercholesterolemia   OSA on CPAP   Morbid obesity ([place])   Diabetes mellitus type 2, noninsulin dependent ([place])   Discharge Condition: stable  Diet recommendation: carb modified.   Filed Weights   11/25/15 2130 11/26/15 0638 11/27/15 0531  Weight: 135.127 kg (297 lb 14.4 oz) 137.032 kg (302 lb 1.6 oz) 137.803 kg (303 lb 12.8 oz)    History of present illness:  Tabitha Scott is a 50 y.o. female with with a past medical history of neuropathy, type 2 diabetes, COPD, hyperlipidemia who returns to the hospital after being admitted and discharged about 5 weeks ago with hypokalemia.  The patient states that since then she has been follow-up of the clinic and last week her potassium levels 2.8 mmol per liter despite she has been taking potassium replacement daily. Today, she was asked by her PCP to come to the emergency department for a lab recheck. Potassium levels results today are 2.3 mmol per liter. She denies nausea, emesis or diarrhea. She has stopped the furosemide and metolazone since then. However, she states that she has been having persistent painful muscle cramps despite stopping the diuretics and taking potassium replacement.  She is being admitted to observation for cardiac telemetry and potassium replacement.  Hospital Course:   Hypokalemia; Resolved.  Started  low dose spironolactone, will help with diuresis and hypokalemia.  Received IV mg 4-03. I will hold KCL supplement at discharge , her K is at 4, she is on spironolactone. I advised patient to  have lab work on Friday.   COPD Exacerbation, mild flare.  She has bilateral wheezing.  Improved on low  dose prednisone. will provide taper for 3 days.  PRN nebulizer.   Hypothyroidism Continue levothyroxine. TSH level 2.5   Pure hypercholesterolemia Resume statins.  total CK level normal   OSA on CPAP Continue CPAP at night.   Morbid obesity ([place]) Continue lifestyle modifications.   Diabetes mellitus type 2, noninsulin dependent (HCC)  Carbohydrate modified diet. Resume  Tradjenta and Victoza. CBG monitoring before meals. SSI   Procedures:  none  Consultations:  none  Discharge Exam: Filed Vitals:   11/26/15 2039 11/27/15 0531  BP: 123/67 128/67  Pulse: 95 91  Temp: 99.1 F (37.3 C) 98.4 F (36.9 C)  Resp: 20 18    General: Alert in no acute distress Cardiovascular: S 1, S 2 RRR Respiratory: sporadic wheezing   Discharge Instructions   Discharge Instructions    Diet - low sodium heart healthy    Complete by:  As directed      Increase activity slowly    Complete by:  As directed           Current Discharge Medication List    START taking these medications   Details  predniSONE (DELTASONE) 20 MG tablet Take 2 tablets (40 mg total) by mouth daily with breakfast. Qty: 6 tablet, Refills: 0    spironolactone (ALDACTONE) 25 MG tablet Take 0.5 tablets (12.5 mg total) by mouth daily. Qty: 30 tablet, Refills: 0      CONTINUE these medications which  have NOT CHANGED   Details  albuterol (PROVENTIL HFA;VENTOLIN HFA) 108 (90 Base) MCG/ACT inhaler Inhale 1-2 puffs into the lungs every 6 (six) hours as needed for wheezing or shortness of breath. Qty: 1 Inhaler, Refills: 0    atorvastatin (LIPITOR) 40 MG tablet Take 1 tablet (40 mg total) by mouth daily. Qty: 90 tablet, Refills: 2    ferrous sulfate 325 (65 FE) MG tablet Take 1 tablet (325 mg total) by mouth daily with breakfast. Qty: 30 tablet, Refills: 3   Associated Diagnoses: Anemia,  unspecified anemia type    gabapentin (NEURONTIN) 300 MG capsule Take 1 capsule (300 mg total) by mouth 2 (two) times daily. Qty: 180 capsule, Refills: 3    levothyroxine (SYNTHROID, LEVOTHROID) 25 MCG tablet Take 1 tablet (25 mcg total) by mouth daily before breakfast. Qty: 90 tablet, Refills: 2    linagliptin (TRADJENTA) 5 MG TABS tablet Take 1 tablet (5 mg total) by mouth daily. Qty: 90 tablet, Refills: 3    Liraglutide (VICTOZA) 18 MG/3ML SOPN Inject 0.3 mLs (1.8 mg total) into the skin daily. Qty: 6 mL, Refills: prn   Associated Diagnoses: Diabetes mellitus of other type with complication (HCC)    omeprazole (PRILOSEC) 40 MG capsule Take 1 capsule (40 mg total) by mouth daily. Qty: 90 capsule, Refills: 3    tiZANidine (ZANAFLEX) 4 MG capsule Take 1 capsule (4 mg total) by mouth 3 (three) times daily as needed for muscle spasms. Qty: 10 capsule, Refills: 0      STOP taking these medications     meloxicam (MOBIC) 15 MG tablet      metolazone (ZAROXOLYN) 5 MG tablet      potassium chloride SA (K-DUR,KLOR-CON) 20 MEQ tablet        Allergies  Allergen Reactions  . Tetanus Toxoids Swelling   Follow-up Information    Follow up with Sharon Seller, NP.   Specialty:  Family Medicine   Why:  You need lab work on [employer] information:   [place] [place] [address] [phone]        The results of significant diagnostics from this hospitalization (including imaging, microbiology, ancillary and laboratory) are listed below for reference.    Significant Diagnostic Studies: Dg Chest 2 View  11/25/2015  CLINICAL DATA:  Cough, hyperkalemia EXAM: CHEST  2 VIEW COMPARISON:  12/12/2015 FINDINGS: Minimal enlargement of cardiac silhouette. Mediastinal contours and pulmonary vascularity normal. Lungs clear. Minimal chronic peribronchial thickening. No pulmonary infiltrate, pleural effusion or pneumothorax. Bones unremarkable. IMPRESSION: Minimal chronic  bronchitic changes without infiltrate Electronically Signed   By: Lavonia Dana M.D.   On: 11/25/2015 18:22    Microbiology: Recent Results (from the past 240 hour(s))  MRSA PCR Screening     Status: Abnormal   Collection Time: 11/25/15 10:56 PM  Result Value Ref Range Status   MRSA by PCR POSITIVE (A) NEGATIVE Final    Comment:        The GeneXpert MRSA Assay (FDA approved for NASAL specimens only), is one component of a comprehensive MRSA colonization surveillance program. It is not intended to diagnose MRSA infection nor to guide or monitor treatment for MRSA infections. RESULT CALLED TO, READ BACK BY AND VERIFIED WITH: A ZINOANARIN,RN @0230  11/26/15 MKELLY      Labs: Basic Metabolic Panel:  Recent Labs Lab 11/25/15 1526 11/25/15 1809 11/25/15 2225 11/26/15 0538 11/27/15 0210  NA 139  --  138 142 141  K 2.3*  --  3.1* 3.1* 4.0  CL 91*  --  95* 99* 100*  CO2 35*  --  30 31 30   GLUCOSE 147*  --  127* 117* 151*  BUN 11  --  10 8 9   CREATININE 1.21*  --  1.10* 1.01* 1.04*  CALCIUM 9.1  --  8.6* 8.4* 9.1  MG  --  1.9  --   --   --   PHOS  --   --  3.2  --   --    Liver Function Tests:  Recent Labs Lab 11/25/15 1526 11/26/15 0538  AST 56* 42*  ALT 37 30  ALKPHOS 73 65  BILITOT 1.0 0.8  PROT 7.9 6.5  ALBUMIN 3.6 2.9*   No results for input(s): LIPASE, AMYLASE in the last 168 hours. No results for input(s): AMMONIA in the last 168 hours. CBC:  Recent Labs Lab 11/25/15 1526 11/26/15 0538  WBC 10.2 6.7  NEUTROABS  --  3.2  HGB 11.7* 10.2*  HCT 36.3 33.1*  MCV 92.6 93.2  PLT 211 172   Cardiac Enzymes:  Recent Labs Lab 11/25/15 2137  CKTOTAL 225   BNP: BNP (last 3 results)  Recent Labs  03/12/15 1655 03/30/15 1357  BNP 98.7 153.8*    ProBNP (last 3 results) No results for input(s): PROBNP in the last 8760 hours.  CBG:  Recent Labs Lab 11/26/15 0657 11/26/15 1158 11/26/15 1708 11/26/15 2233 11/27/15 0648  GLUCAP 101* 122* 152*  171* 123*       Signed:  Niel Hummer A MD.  [facility] 11/27/2015, 9:00 AM

## 2015-12-02 ENCOUNTER — Other Ambulatory Visit (INDEPENDENT_AMBULATORY_CARE_PROVIDER_SITE_OTHER): Payer: Medicaid Other

## 2015-12-02 ENCOUNTER — Other Ambulatory Visit: Payer: Self-pay | Admitting: Family Medicine

## 2015-12-02 DIAGNOSIS — E876 Hypokalemia: Secondary | ICD-10-CM

## 2015-12-02 LAB — COMPLETE METABOLIC PANEL WITH GFR
ALBUMIN: 3.7 g/dL (ref 3.6–5.1)
ALK PHOS: 59 U/L (ref 33–130)
ALT: 37 U/L — AB (ref 6–29)
AST: 37 U/L — ABNORMAL HIGH (ref 10–35)
BILIRUBIN TOTAL: 0.6 mg/dL (ref 0.2–1.2)
BUN: 15 mg/dL (ref 7–25)
CALCIUM: 8.5 mg/dL — AB (ref 8.6–10.4)
CHLORIDE: 101 mmol/L (ref 98–110)
CO2: 31 mmol/L (ref 20–31)
CREATININE: 1.03 mg/dL (ref 0.50–1.05)
GFR, EST AFRICAN AMERICAN: 73 mL/min (ref >=60)
GFR, Est Non African American: 63 mL/min (ref >=60)
Glucose, Bld: 103 mg/dL — ABNORMAL HIGH (ref 65–99)
Potassium: 3.6 mmol/L (ref 3.5–5.3)
Sodium: 142 mmol/L (ref 135–146)
TOTAL PROTEIN: 6.7 g/dL (ref 6.1–8.1)

## 2015-12-02 LAB — MAGNESIUM: MAGNESIUM: 1.4 mg/dL — AB (ref 1.5–2.5)

## 2015-12-10 DIAGNOSIS — G479 Sleep disorder, unspecified: Secondary | ICD-10-CM | POA: Insufficient documentation

## 2015-12-10 DIAGNOSIS — K219 Gastro-esophageal reflux disease without esophagitis: Secondary | ICD-10-CM

## 2015-12-10 DIAGNOSIS — Z87891 Personal history of nicotine dependence: Secondary | ICD-10-CM | POA: Insufficient documentation

## 2015-12-10 DIAGNOSIS — R490 Dysphonia: Secondary | ICD-10-CM | POA: Insufficient documentation

## 2015-12-21 ENCOUNTER — Emergency Department (HOSPITAL_COMMUNITY)
Admission: EM | Admit: 2015-12-21 | Discharge: 2015-12-21 | Disposition: A | Discharge status: Home / Self Care | Payer: Medicaid Other | Attending: Emergency Medicine | Admitting: Emergency Medicine

## 2015-12-21 ENCOUNTER — Emergency Department (HOSPITAL_COMMUNITY): Payer: Medicaid Other

## 2015-12-21 ENCOUNTER — Encounter (HOSPITAL_COMMUNITY): Payer: Self-pay | Admitting: Nurse Practitioner

## 2015-12-21 DIAGNOSIS — E119 Type 2 diabetes mellitus without complications: Secondary | ICD-10-CM | POA: Insufficient documentation

## 2015-12-21 DIAGNOSIS — E785 Hyperlipidemia, unspecified: Secondary | ICD-10-CM | POA: Insufficient documentation

## 2015-12-21 DIAGNOSIS — E669 Obesity, unspecified: Secondary | ICD-10-CM | POA: Diagnosis not present

## 2015-12-21 DIAGNOSIS — Z79899 Other long term (current) drug therapy: Secondary | ICD-10-CM | POA: Diagnosis not present

## 2015-12-21 DIAGNOSIS — E039 Hypothyroidism, unspecified: Secondary | ICD-10-CM | POA: Diagnosis not present

## 2015-12-21 DIAGNOSIS — X58XXXA Exposure to other specified factors, initial encounter: Secondary | ICD-10-CM | POA: Insufficient documentation

## 2015-12-21 DIAGNOSIS — Z7952 Long term (current) use of systemic steroids: Secondary | ICD-10-CM | POA: Diagnosis not present

## 2015-12-21 DIAGNOSIS — G629 Polyneuropathy, unspecified: Secondary | ICD-10-CM | POA: Insufficient documentation

## 2015-12-21 DIAGNOSIS — Y92009 Unspecified place in unspecified non-institutional (private) residence as the place of occurrence of the external cause: Secondary | ICD-10-CM | POA: Diagnosis not present

## 2015-12-21 DIAGNOSIS — Y9389 Activity, other specified: Secondary | ICD-10-CM | POA: Insufficient documentation

## 2015-12-21 DIAGNOSIS — M25562 Pain in left knee: Secondary | ICD-10-CM

## 2015-12-21 DIAGNOSIS — Z87891 Personal history of nicotine dependence: Secondary | ICD-10-CM | POA: Diagnosis not present

## 2015-12-21 DIAGNOSIS — Z7984 Long term (current) use of oral hypoglycemic drugs: Secondary | ICD-10-CM | POA: Insufficient documentation

## 2015-12-21 DIAGNOSIS — J449 Chronic obstructive pulmonary disease, unspecified: Secondary | ICD-10-CM | POA: Diagnosis not present

## 2015-12-21 DIAGNOSIS — Y998 Other external cause status: Secondary | ICD-10-CM | POA: Insufficient documentation

## 2015-12-21 DIAGNOSIS — S8992XA Unspecified injury of left lower leg, initial encounter: Secondary | ICD-10-CM | POA: Insufficient documentation

## 2015-12-21 MED ORDER — NAPROXEN 500 MG PO TABS: 500.0000 mg | ORAL_TABLET | Freq: Two times a day (BID) | ORAL | Status: DC

## 2015-12-21 MED ORDER — DIAZEPAM 5 MG PO TABS
5.0000 mg | ORAL_TABLET | Freq: Once | ORAL | Status: AC
  Administered 2015-12-21: 5 mg via ORAL
  Filled 2015-12-21: qty 1

## 2015-12-21 MED ORDER — NAPROXEN 250 MG PO TABS
500.0000 mg | ORAL_TABLET | Freq: Once | ORAL | Status: AC
  Administered 2015-12-21: 500 mg via ORAL
  Filled 2015-12-21: qty 2

## 2015-12-21 MED ORDER — DIAZEPAM 5 MG PO TABS: 5.0000 mg | ORAL_TABLET | Freq: Two times a day (BID) | ORAL | Status: DC

## 2015-12-21 NOTE — ED Notes (Signed)
She c/o 1 week history L knee pain and swelling to L:E. Onset after she was walking and heard a pop in her knee. Pain relieved by rest. Pt increased with movment and weight bearing.

## 2015-12-21 NOTE — ED Notes (Signed)
Declined W/C at D/C and was escorted to lobby by RN. 

## 2015-12-21 NOTE — ED Provider Notes (Signed)
CSN: [id number]     Arrival date & time 12/21/15  1425 History  By signing my name below, I, Soijett Blue, attest that this documentation has been prepared under the direction and in the presence of S. Lane Hacker, PA-C Electronically Signed: Soijett Blue, ED Scribe. 12/21/2015. 3:47 PM.    Chief Complaint  Patient presents with  . Knee Pain     The history is provided by the patient. No language interpreter was used.    Tabitha Scott is a 50 y.o. female with a medical hx of DM who presents to the Emergency Department complaining of left knee pain onset 1 week. Pt states that she was ambulating turning a corner in her house when she heard a pop to her left knee. Pt notes that with ambulation her left knee pops. Pt left knee pain is worsened with movement and weight bearing. Pt is having associated symptoms of left knee swelling. She notes that she has not tried any medications for the relief of her symptoms. She denies color change, wound, rash, fever, chills, and any other symptoms.    Past Medical History  Diagnosis Date  . Neuropathy (HCC)   . Diabetes mellitus without complication (HCC)   . COPD (chronic obstructive pulmonary disease) (HCC)   . Hyperlipidemia   . Oxygen deficiency     prescribed but doesn't use regularly  . Thyroid disease    Past Surgical History  Procedure Laterality Date  . Cholecystectomy    . Knee surgery      tissue removed, 2011?  Marland Kitchen Arm surgery  2009    nerver repair to both arms   . Carpal tunnel release  2009   Family History  Problem Relation Age of Onset  . COPD Mother   . Heart disease Father   . Cancer Maternal Grandmother    Social History  Substance Use Topics  . Smoking status: Former Smoker    Quit date: 01/11/2015  . Smokeless tobacco: None  . Alcohol Use: No   OB History    No data available     Review of Systems   A complete 10 system review of systems was obtained and all systems are negative except as noted in the HPI and  PMH.  Allergies  Tetanus toxoids  Home Medications   Prior to Admission medications   Medication Sig Start Date End Date Taking? Authorizing Provider  albuterol (PROVENTIL HFA;VENTOLIN HFA) 108 (90 Base) MCG/ACT inhaler Inhale 1-2 puffs into the lungs every 6 (six) hours as needed for wheezing or shortness of breath. 10/06/15   Ace Gins Sam, PA-C  atorvastatin (LIPITOR) 40 MG tablet Take 1 tablet (40 mg total) by mouth daily. 10/22/15   Henrietta Hoover, NP  ferrous sulfate 325 (65 FE) MG tablet Take 1 tablet (325 mg total) by mouth daily with breakfast. 08/22/15   Henrietta Hoover, NP  gabapentin (NEURONTIN) 300 MG capsule Take 1 capsule (300 mg total) by mouth 2 (two) times daily. 04/09/15   Henrietta Hoover, NP  levothyroxine (SYNTHROID, LEVOTHROID) 25 MCG tablet Take 1 tablet (25 mcg total) by mouth daily before breakfast. 04/09/15   Henrietta Hoover, NP  linagliptin (TRADJENTA) 5 MG TABS tablet Take 1 tablet (5 mg total) by mouth daily. 10/01/15   Henrietta Hoover, NP  Liraglutide (VICTOZA) 18 MG/3ML SOPN Inject 0.3 mLs (1.8 mg total) into the skin daily. 10/24/15   Henrietta Hoover, NP  omeprazole (PRILOSEC) 40 MG capsule Take 1 capsule (  40 mg total) by mouth daily. 05/02/15   Henrietta HooverLinda C Bernhardt, NP  predniSONE (DELTASONE) 20 MG tablet Take 2 tablets (40 mg total) by mouth daily with breakfast. 11/27/15   Belkys A Regalado, MD  spironolactone (ALDACTONE) 25 MG tablet Take 0.5 tablets (12.5 mg total) by mouth daily. 11/27/15   Belkys A Regalado, MD  tiZANidine (ZANAFLEX) 4 MG capsule Take 1 capsule (4 mg total) by mouth 3 (three) times daily as needed for muscle spasms. 10/24/15   Henrietta HooverLinda C Bernhardt, NP   BP 116/97 mmHg  Pulse 100  Temp(Src) 98.7 F (37.1 C) (Oral)  Resp 18  SpO2 98% Physical Exam  Constitutional: She is oriented to person, place, and time. She appears well-developed and well-nourished. No distress.  Morbid obesity.   HENT:  Head: Normocephalic and atraumatic.  Eyes: EOM are  normal.  Neck: Neck supple.  Cardiovascular: Normal rate.   Pulmonary/Chest: Effort normal. No respiratory distress.  Abdominal: She exhibits no distension.  Musculoskeletal: Normal range of motion.  Left knee: Anterior left knee TTP. FROM. Crepitus. No erythema. Bilateral pitting edema (chronic). No discoloration or rashes.   Neurological: She is alert and oriented to person, place, and time.  Skin: Skin is warm and dry.  Psychiatric: She has a normal mood and affect. Her behavior is normal.  Nursing note and vitals reviewed.   ED Course  Procedures DIAGNOSTIC STUDIES: Oxygen Saturation is 98% on RA, nl by my interpretation.    COORDINATION OF CARE: 3:43 PM Discussed treatment plan with pt at bedside which includes left knee xray and pt agreed to plan.  Imaging Review Dg Knee Complete 4 Views Left  12/21/2015  CLINICAL DATA:  50 year old female with a history of left knee pain EXAM: LEFT KNEE - COMPLETE 4+ VIEW COMPARISON:  None. FINDINGS: No acute fracture. No focal soft tissue swelling. No joint effusion. Medial joint space narrowing with marginal osteophyte formation. No radiopaque foreign body. IMPRESSION: No acute bony abnormality. Signed, Yvone NeuJaime S. Loreta AveWagner, DO [facility] Electronically Signed   By: Gilmer MorJaime  Wagner D.O.   On: 12/21/2015 15:01   I have personally reviewed and evaluated these images as part of my medical decision-making.  MDM   Final diagnoses:  Left knee pain   Patient X-Ray negative for obvious fracture or dislocation. No concern for infectious etiology. Pt advised to follow up with orthopedics. Patient given knee sleeve while in ED, conservative therapy recommended and discussed. Patient will be discharged home & is agreeable with above plan. Returns precautions discussed. Pt appears safe for discharge.   I personally performed the services described in this documentation, which was scribed in my  presence. The recorded information has been reviewed and is accurate.    Melton KrebsSamantha Nicole Zariana Strub, PA-C 12/22/15 1739  Lorre NickAnthony Allen, MD 12/27/15 [phone]

## 2015-12-21 NOTE — Discharge Instructions (Signed)
[name],  Nice meeting you! Please follow-up with your primary care provider. Return to the emergency department if you develop increased pain, swelling, discolorations, inability to walk, new/worsening symptoms. Feel better soon!  S. Lane Hacker, PA-C How to Use a Knee Brace A knee brace is a device that you wear to support your knee, especially if the knee is healing after an injury or surgery. There are several types of knee braces. Some are designed to prevent an injury (prophylactic brace). These are often worn during sports. Others support an injured knee (functional brace) or keep it still while it heals (rehabilitative brace). People with severe arthritis of the knee may benefit from a brace that takes some pressure off the knee (unloader brace). Most knee braces are made from a combination of cloth and metal or plastic.  You may need to wear a knee brace to:  Relieve knee pain.  Help your knee support your weight (improve stability).  Help you walk farther (improve mobility).  Prevent injury.  Support your knee while it heals from surgery or from an injury. RISKS AND COMPLICATIONS Generally, knee braces are very safe to wear. However, problems may occur, including:  Skin irritation that may lead to infection.  Making your condition worse if you wear the brace in the wrong way. HOW TO USE A KNEE BRACE Different braces will have different instructions for use. Your health care provider will tell you or show you:  How to put on your brace.  How to adjust the brace.  When and how often to wear the brace.  How to remove the brace.  If you will need any assistive devices in addition to the brace, such as crutches or a cane. In general, your brace should:  Have the hinge of the brace line up with the bend of your knee.  Have straps, hooks, or tapes that fasten snugly around your leg.  Not feel too tight or too loose. HOW TO CARE FOR A KNEE BRACE  Check your  brace often for signs of damage, such as loose connections or attachments. Your knee brace may get damaged or wear out during normal use.  Wash the fabric parts of your brace with soap and water.  Read the insert that comes with your brace for other specific care instructions. SEEK MEDICAL CARE IF:  Your knee brace is too loose or too tight and you cannot adjust it.  Your knee brace causes skin redness, swelling, bruising, or irritation.  Your knee brace is not helping.  Your knee brace is making your knee pain worse.   This information is not intended to replace advice given to you by your health care provider. Make sure you discuss any questions you have with your health care provider.   Document Released: 10/31/2003 Document Revised: 05/01/2015 Document Reviewed: 12/03/2014 Elsevier Interactive Patient Education 2016 [employer]  Joint Pain Joint pain, which is also called arthralgia, can be caused by many things. Joint pain often goes away when you follow your health care provider's instructions for relieving pain at home. However, joint pain can also be caused by conditions that require further treatment. Common causes of joint pain include:  Bruising in the area of the joint.  Overuse of the joint.  Wear and tear on the joints that occur with aging (osteoarthritis).  Various other forms of arthritis.  A buildup of a crystal form of uric acid in the joint (gout).  Infections of the joint (septic arthritis) or  of the bone (osteomyelitis). Your health care provider may recommend medicine to help with the pain. If your joint pain continues, additional tests may be needed to diagnose your condition. HOME CARE INSTRUCTIONS Watch your condition for any changes. Follow these instructions as directed to lessen the pain that you are feeling.  Take medicines only as directed by your health care provider.  Rest the affected area for as long as your health care provider says that  you should. If directed to do so, raise the painful joint above the level of your heart while you are sitting or lying down.  Do not do things that cause or worsen pain.  If directed, apply ice to the painful area:  Put ice in a plastic bag.  Place a towel between your skin and the bag.  Leave the ice on for 20 minutes, 2-3 times per day.  Wear an elastic bandage, splint, or sling as directed by your health care provider. Loosen the elastic bandage or splint if your fingers or toes become numb and tingle, or if they turn cold and blue.  Begin exercising or stretching the affected area as directed by your health care provider. Ask your health care provider what types of exercise are safe for you.  Keep all follow-up visits as directed by your health care provider. This is important. SEEK MEDICAL CARE IF:  Your pain increases, and medicine does not help.  Your joint pain does not improve within 3 days.  You have increased bruising or swelling.  You have a fever.  You lose 10 lb (4.5 kg) or more without trying. SEEK IMMEDIATE MEDICAL CARE IF:  You are not able to move the joint.  Your fingers or toes become numb or they turn cold and blue.   This information is not intended to replace advice given to you by your health care provider. Make sure you discuss any questions you have with your health care provider.   Document Released: 08/10/2005 Document Revised: 08/31/2014 Document Reviewed: 05/22/2014 Elsevier Interactive Patient Education [employer] Else[employer] Inc.

## 2015-12-23 ENCOUNTER — Ambulatory Visit: Payer: Medicaid Other | Admitting: Family Medicine

## 2015-12-26 ENCOUNTER — Encounter: Payer: Self-pay | Admitting: Family Medicine

## 2015-12-26 ENCOUNTER — Ambulatory Visit (INDEPENDENT_AMBULATORY_CARE_PROVIDER_SITE_OTHER): Payer: Medicaid Other | Admitting: Family Medicine

## 2015-12-26 VITALS — BP 136/67 | HR 85 | Temp 98.6°F | Resp 18 | Ht 64.5 in | Wt 308.0 lb

## 2015-12-26 DIAGNOSIS — G629 Polyneuropathy, unspecified: Secondary | ICD-10-CM

## 2015-12-26 DIAGNOSIS — E876 Hypokalemia: Secondary | ICD-10-CM | POA: Diagnosis not present

## 2015-12-26 DIAGNOSIS — M25562 Pain in left knee: Secondary | ICD-10-CM

## 2015-12-26 DIAGNOSIS — M545 Low back pain: Secondary | ICD-10-CM

## 2015-12-26 LAB — COMPLETE METABOLIC PANEL WITH GFR
ALT: 23 U/L (ref 6–29)
AST: 30 U/L (ref 10–35)
Albumin: 3.8 g/dL (ref 3.6–5.1)
Alkaline Phosphatase: 65 U/L (ref 33–130)
BILIRUBIN TOTAL: 0.7 mg/dL (ref 0.2–1.2)
BUN: 10 mg/dL (ref 7–25)
CHLORIDE: 102 mmol/L (ref 98–110)
CO2: 31 mmol/L (ref 20–31)
Calcium: 8.8 mg/dL (ref 8.6–10.4)
Creat: 1.19 mg/dL — ABNORMAL HIGH (ref 0.50–1.05)
GFR, EST AFRICAN AMERICAN: 61 mL/min (ref >=60)
GFR, EST NON AFRICAN AMERICAN: 53 mL/min — AB (ref >=60)
Glucose, Bld: 102 mg/dL — ABNORMAL HIGH (ref 65–99)
Potassium: 3.7 mmol/L (ref 3.5–5.3)
Sodium: 141 mmol/L (ref 135–146)
TOTAL PROTEIN: 6.7 g/dL (ref 6.1–8.1)

## 2015-12-26 LAB — MAGNESIUM: Magnesium: 1.8 mg/dL (ref 1.5–2.5)

## 2015-12-26 MED ORDER — GABAPENTIN 300 MG PO CAPS: 300.0000 mg | ORAL_CAPSULE | Freq: Three times a day (TID) | ORAL | Status: DC

## 2015-12-26 NOTE — Patient Instructions (Signed)
Increase gabapentin to 3 times a day. We will let you know what to do about potassium We are putting in referral to orthopedist for knee and back pain.

## 2015-12-26 NOTE — Progress Notes (Signed)
Patient ID: Tabitha Scott Spratling, female   DOB: 05-10-1966, 50 y.o.   MRN: [id number]   Tabitha Scott Cantara, is a 50 y.o. female  [id number]  [id number]  DOB - 05-10-1966  CC:  Chief Complaint  Patient presents with  . Arthritis    left knee   . Back Pain    pain shooting up the left side of spine   . Foot Swelling    left leg        HPI: Tabitha Scott Oconnor is a 50 y.o. female here for follow-up visit re hypokalemia as well as follow-up for knee pain after a ED visit last week.  She also complains of increased low back pain. She reports increased numbness and tingling of feet. She is currently on gabapentin 300 bid.   Health Maintenance:  Allergic to Tetanus. Needs diabetic eye exam, pap smear, mammogram. Colonoscopy up to date. I have ordered mammogram and she has been referred to GYN for history of abnormal cervical cells.   Allergies  Allergen Reactions  . Tetanus Toxoids Swelling   Past Medical History  Diagnosis Date  . Neuropathy (HCC)   . Diabetes mellitus without complication (HCC)   . COPD (chronic obstructive pulmonary disease) (HCC)   . Hyperlipidemia   . Oxygen deficiency     prescribed but doesn't use regularly  . Thyroid disease    Current Outpatient Prescriptions on File Prior to Visit  Medication Sig Dispense Refill  . albuterol (PROVENTIL HFA;VENTOLIN HFA) 108 (90 Base) MCG/ACT inhaler Inhale 1-2 puffs into the lungs every 6 (six) hours as needed for wheezing or shortness of breath. 1 Inhaler 0  . atorvastatin (LIPITOR) 40 MG tablet Take 1 tablet (40 mg total) by mouth daily. 90 tablet 2  . diazepam (VALIUM) 5 MG tablet Take 1 tablet (5 mg total) by mouth 2 (two) times daily. 10 tablet 0  . ferrous sulfate 325 (65 FE) MG tablet Take 1 tablet (325 mg total) by mouth daily with breakfast. 30 tablet 3  . levothyroxine (SYNTHROID, LEVOTHROID) 25 MCG tablet Take 1 tablet (25 mcg total) by mouth daily before breakfast. 90 tablet 2  . linagliptin (TRADJENTA) 5 MG TABS tablet Take 1  tablet (5 mg total) by mouth daily. 90 tablet 3  . Liraglutide (VICTOZA) 18 MG/3ML SOPN Inject 0.3 mLs (1.8 mg total) into the skin daily. 6 mL prn  . naproxen (NAPROSYN) 500 MG tablet Take 1 tablet (500 mg total) by mouth 2 (two) times daily. 30 tablet 0  . omeprazole (PRILOSEC) 40 MG capsule Take 1 capsule (40 mg total) by mouth daily. 90 capsule 3  . tiZANidine (ZANAFLEX) 4 MG capsule Take 1 capsule (4 mg total) by mouth 3 (three) times daily as needed for muscle spasms. 10 capsule 0  . predniSONE (DELTASONE) 20 MG tablet Take 2 tablets (40 mg total) by mouth daily with breakfast. (Patient not taking: Reported on 12/26/2015) 6 tablet 0  . spironolactone (ALDACTONE) 25 MG tablet Take 0.5 tablets (12.5 mg total) by mouth daily. (Patient not taking: Reported on 12/26/2015) 30 tablet 0   No current facility-administered medications on file prior to visit.   Family History  Problem Relation Age of Onset  . COPD Mother   . Heart disease Father   . Cancer Maternal Grandmother    Social History   Social History  . Marital Status: Single    Spouse Name: N/A  . Number of Children: N/A  . Years of Education: N/A   Occupational History  .  Not on file.   Social History Main Topics  . Smoking status: Former Smoker    Quit date: 01/11/2015  . Smokeless tobacco: Not on file  . Alcohol Use: No  . Drug Use: No  . Sexual Activity: No   Other Topics Concern  . Not on file   Social History Narrative    Review of Systems: Constitutional: Negative for fever, chills, appetite change, weight loss,  Fatigue. Skin: Negative for rashes or lesions of concern. HENT: Negative for ear pain, ear discharge.nose bleeds Eyes: Negative for pain, discharge, redness, itching and visual disturbance. Neck: Negative for pain, stiffness Respiratory: Negative for cough, shortness of breath,   Cardiovascular: Negative for chest pain, palpitations. Positive for swelling of lower legs, ankles and feet, greater on  left at present Gastrointestinal: Negative for abdominal pain, nausea, vomiting, diarrhea, constipations Genitourinary: Negative for dysuria, urgency, frequency, hematuria,  Musculoskeletal: Positive for low back pain, l. Knee pain with swelling.  Neurological: Negative for dizziness, tremors, seizures, syncope,   light-headedness, numbness and headaches. Positive for numbness and tingling of feet and lower legs. Hematological: Negative for easy bruising or bleeding Psychiatric/Behavioral: Negative for depression, anxiety, decreased concentration, confusion   Objective:   Filed Vitals:   12/26/15 1315  BP: 136/67  Pulse: 85  Temp: 98.6 F (37 C)  Resp: 18    Physical Exam: Constitutional: Patient appears well-developed and well-nourished. No distress. HENT: Normocephalic, atraumatic, External right and left ear normal. Oropharynx is clear and moist.  Eyes: Conjunctivae and EOM are normal. PERRLA, no scleral icterus. Neck: Normal ROM. Neck supple. No lymphadenopathy, No thyromegaly. CVS: RRR, S1/S2 +, no murmurs, no gallops, no rubs Pulmonary: Effort and breath sounds normal, no stridor, rhonchi, wheezes, rales.  Abdominal: Soft. Normoactive BS,, no distension, tenderness, rebound or guarding.  Musculoskeletal: There is swelling and tenderness of the left knee, anteriorly, medially and laterally, swelling of left ankle and foot Neuro: Alert.Normal muscle tone coordination. Non-focal Skin: Skin is warm and dry. No rash noted. Not diaphoretic. No erythema. No pallor. Psychiatric: Normal mood and affect. Behavior, judgment, thought content normal.  Lab Results  Component Value Date   WBC 6.7 11/26/2015   HGB 10.2* 11/26/2015   HCT 33.1* 11/26/2015   MCV 93.2 11/26/2015   PLT 172 11/26/2015   Lab Results  Component Value Date   CREATININE 1.03 12/02/2015   BUN 15 12/02/2015   NA 142 12/02/2015   K 3.6 12/02/2015   CL 101 12/02/2015   CO2 31 12/02/2015    Lab Results   Component Value Date   HGBA1C 5.6 09/24/2015   Lipid Panel     Component Value Date/Time   CHOL 164 09/24/2015 0904   TRIG 260* 09/24/2015 0904   HDL 31* 09/24/2015 0904   CHOLHDL 5.3* 09/24/2015 0904   VLDL 52* 09/24/2015 0904   LDLCALC 81 09/24/2015 0904       Assessment and plan:   1. Knee pain, acute, left  - Ambulatory referral to Orthopedic Surgery  2. Left low back pain, with sciatica presence unspecified - Ambulatory referral to Orthopedic Surgery  3. Neuropathy (HCC)  - gabapentin (NEURONTIN) 300 MG capsule; Take 1 capsule (300 mg total) by mouth 3 (three) times daily.  Dispense: 180 capsule; Refill: 3  4. Hypokalemia  - COMPLETE METABOLIC PANEL WITH GFR - Magnesium   Return in about 3 months (around 03/27/2016), or if symptoms worsen or fail to improve.  The patient was given clear instructions to go to ER  or return to medical center if symptoms don't improve, worsen or new problems develop. The patient verbalized understanding.    Henrietta Hoover FNP  12/26/2015, 3:48 PM

## 2015-12-30 ENCOUNTER — Telehealth: Payer: Self-pay

## 2015-12-30 NOTE — Telephone Encounter (Signed)
Patient called about lab work. Results provided to patient.

## 2016-01-03 ENCOUNTER — Other Ambulatory Visit: Payer: Self-pay | Admitting: Sports Medicine

## 2016-01-03 DIAGNOSIS — M25562 Pain in left knee: Secondary | ICD-10-CM

## 2016-01-11 ENCOUNTER — Ambulatory Visit
Admission: RE | Admit: 2016-01-11 | Discharge: 2016-01-11 | Disposition: A | Discharge status: Home / Self Care | Payer: Medicaid Other | Source: Ambulatory Visit | Attending: Sports Medicine | Admitting: Sports Medicine

## 2016-01-11 DIAGNOSIS — M25562 Pain in left knee: Secondary | ICD-10-CM

## 2016-01-21 ENCOUNTER — Telehealth: Payer: Self-pay | Admitting: *Deleted

## 2016-01-21 NOTE — Telephone Encounter (Signed)
-----   Message from Henrietta HooverLinda C Bernhardt, NP sent at 12/27/2015  8:23 AM EDT ----- Potassium back within normal limits. Continue potassium at current dosage. Kidney function is a little decreased. Will monitor for now.

## 2016-01-21 NOTE — Telephone Encounter (Signed)
Patient verified DOB Patient is aware of potassium being back within normal limits. Patient advised to continue with current dosage of potassium. Patient is also aware of kidney function decreasing slightly and patient informed of function being monitored. Patient request referral to pain clinic of ortho due to back and knee pain becoming intolerable. Patient is aware of referral request being sent to the provider and upon approval patient being contacted with appointment time and date over the next 2 weeks. Patient expressed her understanding and had no further questions at this time.

## 2016-02-10 ENCOUNTER — Emergency Department (HOSPITAL_COMMUNITY)
Admission: EM | Admit: 2016-02-10 | Discharge: 2016-02-11 | Disposition: A | Discharge status: Home / Self Care | Payer: Medicaid Other | Attending: Emergency Medicine | Admitting: Emergency Medicine

## 2016-02-10 ENCOUNTER — Emergency Department (HOSPITAL_COMMUNITY): Payer: Medicaid Other

## 2016-02-10 ENCOUNTER — Encounter (HOSPITAL_COMMUNITY): Payer: Self-pay | Admitting: Emergency Medicine

## 2016-02-10 ENCOUNTER — Other Ambulatory Visit: Payer: Self-pay

## 2016-02-10 DIAGNOSIS — Z79899 Other long term (current) drug therapy: Secondary | ICD-10-CM | POA: Diagnosis not present

## 2016-02-10 DIAGNOSIS — W010XXA Fall on same level from slipping, tripping and stumbling without subsequent striking against object, initial encounter: Secondary | ICD-10-CM | POA: Insufficient documentation

## 2016-02-10 DIAGNOSIS — Z87891 Personal history of nicotine dependence: Secondary | ICD-10-CM | POA: Insufficient documentation

## 2016-02-10 DIAGNOSIS — Z7984 Long term (current) use of oral hypoglycemic drugs: Secondary | ICD-10-CM | POA: Insufficient documentation

## 2016-02-10 DIAGNOSIS — W19XXXA Unspecified fall, initial encounter: Secondary | ICD-10-CM

## 2016-02-10 DIAGNOSIS — M25562 Pain in left knee: Secondary | ICD-10-CM | POA: Diagnosis not present

## 2016-02-10 DIAGNOSIS — Y939 Activity, unspecified: Secondary | ICD-10-CM | POA: Insufficient documentation

## 2016-02-10 DIAGNOSIS — Y999 Unspecified external cause status: Secondary | ICD-10-CM | POA: Diagnosis not present

## 2016-02-10 DIAGNOSIS — Y92480 Sidewalk as the place of occurrence of the external cause: Secondary | ICD-10-CM | POA: Insufficient documentation

## 2016-02-10 DIAGNOSIS — J449 Chronic obstructive pulmonary disease, unspecified: Secondary | ICD-10-CM | POA: Insufficient documentation

## 2016-02-10 DIAGNOSIS — M25552 Pain in left hip: Secondary | ICD-10-CM | POA: Insufficient documentation

## 2016-02-10 DIAGNOSIS — E1140 Type 2 diabetes mellitus with diabetic neuropathy, unspecified: Secondary | ICD-10-CM | POA: Diagnosis not present

## 2016-02-10 DIAGNOSIS — R52 Pain, unspecified: Secondary | ICD-10-CM

## 2016-02-10 HISTORY — DX: Unspecified osteoarthritis, unspecified site: M19.90

## 2016-02-10 MED ORDER — HYDROCODONE-ACETAMINOPHEN 5-325 MG PO TABS
1.0000 | ORAL_TABLET | Freq: Once | ORAL | Status: AC
  Administered 2016-02-10: 1 via ORAL
  Filled 2016-02-10: qty 1

## 2016-02-10 NOTE — ED Provider Notes (Signed)
CSN: [id number]     Arrival date & time 02/10/16  1819 History   First MD Initiated Contact with Patient 02/10/16 2151     Chief Complaint  Patient presents with  . Fall  . Hip Pain  . Knee Pain     (Consider location/radiation/quality/duration/timing/severity/associated sxs/prior Treatment) HPI Comments: Patient presents to the emergency department with chief complaint of mechanical fall. She states that she slipped on the concrete landing on her left knee. She complains of pain that radiates from her left knee to her left groin. She states that she has not been able to ambulate since the fall. Reports increased pain with movement and palpation. She has not taken anything to alleviate her symptoms.  There are no other associated symptoms. She denies hitting her head or any other injuries.  The history is provided by the patient. No language interpreter was used.    Past Medical History  Diagnosis Date  . Neuropathy (HCC)   . Diabetes mellitus without complication (HCC)   . COPD (chronic obstructive pulmonary disease) (HCC)   . Hyperlipidemia   . Oxygen deficiency     prescribed but doesn't use regularly  . Thyroid disease   . Arthritis    Past Surgical History  Procedure Laterality Date  . Cholecystectomy    . Knee surgery      tissue removed, 2011?  Marland Kitchen Arm surgery  2009    nerver repair to both arms   . Carpal tunnel release  2009   Family History  Problem Relation Age of Onset  . COPD Mother   . Heart disease Father   . Cancer Maternal Grandmother    Social History  Substance Use Topics  . Smoking status: Former Smoker    Quit date: 01/11/2015  . Smokeless tobacco: None  . Alcohol Use: No   OB History    No data available     Review of Systems  Constitutional: Negative for fever and chills.  Respiratory: Negative for shortness of breath.   Cardiovascular: Negative for chest pain.  Gastrointestinal: Negative for nausea, vomiting, diarrhea and constipation.   Genitourinary: Negative for dysuria.  Musculoskeletal: Positive for arthralgias.  All other systems reviewed and are negative.     Allergies  Tetanus toxoids  Home Medications   Prior to Admission medications   Medication Sig Start Date End Date Taking? Authorizing Provider  albuterol (PROVENTIL HFA;VENTOLIN HFA) 108 (90 Base) MCG/ACT inhaler Inhale 1-2 puffs into the lungs every 6 (six) hours as needed for wheezing or shortness of breath. 10/06/15  Yes Ace Gins Sam, PA-C  atorvastatin (LIPITOR) 40 MG tablet Take 1 tablet (40 mg total) by mouth daily. 10/22/15  Yes Henrietta Hoover, NP  gabapentin (NEURONTIN) 300 MG capsule Take 1 capsule (300 mg total) by mouth 3 (three) times daily. 12/26/15  Yes Henrietta Hoover, NP  levothyroxine (SYNTHROID, LEVOTHROID) 25 MCG tablet Take 1 tablet (25 mcg total) by mouth daily before breakfast. 04/09/15  Yes Henrietta Hoover, NP  linagliptin (TRADJENTA) 5 MG TABS tablet Take 1 tablet (5 mg total) by mouth daily. 10/01/15  Yes Henrietta Hoover, NP  Liraglutide (VICTOZA) 18 MG/3ML SOPN Inject 0.3 mLs (1.8 mg total) into the skin daily. 10/24/15  Yes Henrietta Hoover, NP  omeprazole (PRILOSEC) 40 MG capsule Take 1 capsule (40 mg total) by mouth daily. 05/02/15  Yes Henrietta Hoover, NP  tiZANidine (ZANAFLEX) 4 MG capsule Take 1 capsule (4 mg total) by mouth 3 (three) times  daily as needed for muscle spasms. 10/24/15  Yes Henrietta Hoover, NP  diazepam (VALIUM) 5 MG tablet Take 1 tablet (5 mg total) by mouth 2 (two) times daily. 12/21/15   Melton Krebs, PA-C  ferrous sulfate 325 (65 FE) MG tablet Take 1 tablet (325 mg total) by mouth daily with breakfast. 08/22/15   Henrietta Hoover, NP  naproxen (NAPROSYN) 500 MG tablet Take 1 tablet (500 mg total) by mouth 2 (two) times daily. 12/21/15   Melton Krebs, PA-C  predniSONE (DELTASONE) 20 MG tablet Take 2 tablets (40 mg total) by mouth daily with breakfast. Patient not taking: Reported on 12/26/2015  11/27/15   Belkys A Regalado, MD  spironolactone (ALDACTONE) 25 MG tablet Take 0.5 tablets (12.5 mg total) by mouth daily. Patient not taking: Reported on 12/26/2015 11/27/15   Belkys A Regalado, MD   BP 119/69 mmHg  Pulse 83  Temp(Src) 98.1 F (36.7 C) (Oral)  Resp 12  Ht 5' 4.5" (1.638 m)  Wt 131.543 kg  BMI 49.03 kg/m2  SpO2 100% Physical Exam  Constitutional: She is oriented to person, place, and time. She appears well-developed and well-nourished.  HENT:  Head: Normocephalic and atraumatic.  Eyes: Conjunctivae and EOM are normal. Pupils are equal, round, and reactive to light.  Neck: Normal range of motion. Neck supple.  Cardiovascular: Normal rate and regular rhythm.  Exam reveals no gallop and no friction rub.   No murmur heard. Pulmonary/Chest: Effort normal and breath sounds normal. No respiratory distress. She has no wheezes. She has no rales. She exhibits no tenderness.  Abdominal: Soft. Bowel sounds are normal. She exhibits no distension and no mass. There is no tenderness. There is no rebound and no guarding.  Musculoskeletal: Normal range of motion. She exhibits no edema or tenderness.  Left knee tender to palpation over the patella, mild swelling, no bony abnormality or deformity, range of motion and strength of left knee is limited secondary to pain  Left hip exhibits no bony abnormality or deformity  Neurological: She is alert and oriented to person, place, and time.  Skin: Skin is warm and dry.  Psychiatric: She has a normal mood and affect. Her behavior is normal. Judgment and thought content normal.  Nursing note and vitals reviewed.   ED Course  Procedures (including critical care time) Results for orders placed or performed in visit on 12/26/15  COMPLETE METABOLIC PANEL WITH GFR  Result Value Ref Range   Sodium 141 135 - 146 mmol/L   Potassium 3.7 3.5 - 5.3 mmol/L   Chloride 102 98 - 110 mmol/L   CO2 31 20 - 31 mmol/L   Glucose, Bld 102 (H) 65 - 99 mg/dL    BUN 10 7 - 25 mg/dL   Creat 4.09 (H) 8.11 - 1.05 mg/dL   Total Bilirubin 0.7 0.2 - 1.2 mg/dL   Alkaline Phosphatase 65 33 - 130 U/L   AST 30 10 - 35 U/L   ALT 23 6 - 29 U/L   Total Protein 6.7 6.1 - 8.1 g/dL   Albumin 3.8 3.6 - 5.1 g/dL   Calcium 8.8 8.6 - 91.4 mg/dL   GFR, Est African American 61 >=60 mL/min   GFR, Est Non African American 53 (L) >=60 mL/min  Magnesium  Result Value Ref Range   Magnesium 1.8 1.5 - 2.5 mg/dL   Dg Knee Complete 4 Views Left  02/10/2016  CLINICAL DATA:  Status post slip and fall, with left knee pain. Initial  encounter. EXAM: LEFT KNEE - COMPLETE 4+ VIEW COMPARISON:  Left knee radiograph performed 12/21/2015, and left knee MRI performed 01/11/2016 FINDINGS: There is no evidence of fracture or dislocation. The joint spaces are preserved. No significant degenerative change is seen; the patellofemoral joint is grossly unremarkable in appearance. A small knee joint effusion is noted. The visualized soft tissues are otherwise unremarkable in appearance. IMPRESSION: 1. No evidence of fracture or dislocation. 2. Small knee joint effusion noted. Electronically Signed   By: Roanna RaiderJeffery  Chang M.D.   On: 02/10/2016 23:21   Dg Hip Unilat With Pelvis 2-3 Views Left  02/10/2016  CLINICAL DATA:  Slip and fall outside, landing on concrete. Hip pain. History of diabetes and obesity. EXAM: DG HIP (WITH OR WITHOUT PELVIS) 2-3V LEFT COMPARISON:  None. FINDINGS: There is no evidence of hip fracture or dislocation. There is no evidence of arthropathy or other focal bone abnormality. IMPRESSION: Negative. Electronically Signed   By: Awilda Metroourtnay  Bloomer M.D.   On: 02/10/2016 23:20    I have personally reviewed and evaluated these images and lab results as part of my medical decision-making.   MDM   Final diagnoses:  Fall  Left knee pain  Hip pain, left    Patient with mechanical fall. Will check plain films. Will reassess.  Plain films are negative. Will give knee sleeve, and  treat pain, discharge to home. Recommend orthopedic/primary care follow-up if symptoms do not improve. Patient understands and agrees the plan. She is stable and ready for discharge.    Roxy Horsemanobert Amayra Kiedrowski, PA-C 02/11/16 0015  Cathren LaineKevin Steinl, MD 02/11/16 2255

## 2016-02-10 NOTE — ED Notes (Signed)
Pt arrives via EMS from home. Pt slipped on wet pavement and fell landing on left knee and hip. Did not hit her head. C/o left knee pain and left hip pain that are both tender on palpation. Pt able to bear weight and right leg. VSS.

## 2016-02-10 NOTE — ED Notes (Signed)
Patient states she fell outside on concrete on left knee and hip. Did not lose consciousness or hit head. Called EMS, EMS brought her to waiting room since she is stable. No visual deformities noted, left foot swollen.

## 2016-02-11 MED ORDER — HYDROCODONE-ACETAMINOPHEN 5-325 MG PO TABS: 1.0000 | ORAL_TABLET | Freq: Four times a day (QID) | ORAL | Status: DC | PRN

## 2016-02-11 MED ORDER — IBUPROFEN 600 MG PO TABS: 600.0000 mg | ORAL_TABLET | Freq: Four times a day (QID) | ORAL | Status: DC | PRN

## 2016-02-11 NOTE — Discharge Instructions (Signed)

## 2016-02-14 ENCOUNTER — Telehealth: Payer: Self-pay | Admitting: Hematology

## 2016-02-14 NOTE — Telephone Encounter (Signed)
Patient is calling requesting refills for aldactone, potassium, and needles for her insulin It looks like she doesn't take the aldactone, but patient states she is currently taking it. / LOV 05-11/2015

## 2016-02-17 NOTE — Telephone Encounter (Signed)
Called and left message advising that we did not initially prescribe that medication and asked if patient will call provider who prescribed this and see if they will refill. Asked if any questions to call me back and left call back number. Thanks!

## 2016-02-20 ENCOUNTER — Other Ambulatory Visit: Payer: Self-pay

## 2016-02-20 MED ORDER — INSULIN PEN NEEDLE 31G X 5 MM MISC: 1.0000 | Freq: Every day | Status: DC

## 2016-02-20 NOTE — Telephone Encounter (Signed)
Refilled pen needles to pharmacy. Thanks

## 2016-03-10 ENCOUNTER — Telehealth: Payer: Self-pay | Admitting: *Deleted

## 2016-03-10 NOTE — Telephone Encounter (Signed)
How long has she been off potassium?

## 2016-03-10 NOTE — Telephone Encounter (Signed)
Patient is requesting a refill on Potassium.

## 2016-03-13 NOTE — Telephone Encounter (Signed)
Medical Assistant left message on patient's home and cell voicemail. Voicemail states to give [place] [facility] [place] [facility] [phone].  !!!Please schedule patient for lab draw/nurse for potassium!!!

## 2016-03-13 NOTE — Telephone Encounter (Signed)
Patient is requesting a refill and states you have been managing the refills and dosage changes.

## 2016-03-13 NOTE — Telephone Encounter (Signed)
We need to check her potassium. A nurse visit will be fine.

## 2016-04-15 ENCOUNTER — Observation Stay (HOSPITAL_COMMUNITY)
Admission: EM | Admit: 2016-04-15 | Discharge: 2016-04-16 | Disposition: A | Discharge status: Home / Self Care | Payer: Medicaid Other | Attending: Internal Medicine | Admitting: Internal Medicine

## 2016-04-15 ENCOUNTER — Encounter (HOSPITAL_COMMUNITY): Payer: Self-pay | Admitting: Neurology

## 2016-04-15 ENCOUNTER — Emergency Department (HOSPITAL_COMMUNITY): Payer: Medicaid Other

## 2016-04-15 DIAGNOSIS — R079 Chest pain, unspecified: Secondary | ICD-10-CM | POA: Diagnosis present

## 2016-04-15 DIAGNOSIS — D649 Anemia, unspecified: Secondary | ICD-10-CM | POA: Diagnosis present

## 2016-04-15 DIAGNOSIS — I10 Essential (primary) hypertension: Secondary | ICD-10-CM | POA: Diagnosis not present

## 2016-04-15 DIAGNOSIS — E669 Obesity, unspecified: Secondary | ICD-10-CM | POA: Diagnosis not present

## 2016-04-15 DIAGNOSIS — M1990 Unspecified osteoarthritis, unspecified site: Secondary | ICD-10-CM | POA: Insufficient documentation

## 2016-04-15 DIAGNOSIS — E7800 Pure hypercholesterolemia, unspecified: Secondary | ICD-10-CM | POA: Diagnosis not present

## 2016-04-15 DIAGNOSIS — R0789 Other chest pain: Secondary | ICD-10-CM | POA: Diagnosis present

## 2016-04-15 DIAGNOSIS — E1169 Type 2 diabetes mellitus with other specified complication: Secondary | ICD-10-CM

## 2016-04-15 DIAGNOSIS — E1140 Type 2 diabetes mellitus with diabetic neuropathy, unspecified: Secondary | ICD-10-CM | POA: Insufficient documentation

## 2016-04-15 DIAGNOSIS — K219 Gastro-esophageal reflux disease without esophagitis: Secondary | ICD-10-CM

## 2016-04-15 DIAGNOSIS — E039 Hypothyroidism, unspecified: Secondary | ICD-10-CM | POA: Diagnosis present

## 2016-04-15 DIAGNOSIS — Z87891 Personal history of nicotine dependence: Secondary | ICD-10-CM | POA: Diagnosis not present

## 2016-04-15 DIAGNOSIS — Z6841 Body Mass Index (BMI) 40.0 and over, adult: Secondary | ICD-10-CM | POA: Insufficient documentation

## 2016-04-15 DIAGNOSIS — Z794 Long term (current) use of insulin: Secondary | ICD-10-CM | POA: Insufficient documentation

## 2016-04-15 DIAGNOSIS — J439 Emphysema, unspecified: Secondary | ICD-10-CM

## 2016-04-15 DIAGNOSIS — E119 Type 2 diabetes mellitus without complications: Secondary | ICD-10-CM

## 2016-04-15 HISTORY — DX: Other chronic pain: G89.29

## 2016-04-15 HISTORY — DX: Pure hypercholesterolemia, unspecified: E78.00

## 2016-04-15 HISTORY — DX: Type 2 diabetes mellitus without complications: E11.9

## 2016-04-15 HISTORY — DX: Hypothyroidism, unspecified: E03.9

## 2016-04-15 HISTORY — DX: Obstructive sleep apnea (adult) (pediatric): G47.33

## 2016-04-15 HISTORY — DX: Low back pain, unspecified: M54.50

## 2016-04-15 HISTORY — DX: Dependence on supplemental oxygen: Z99.81

## 2016-04-15 HISTORY — DX: Pain in left shoulder: M25.512

## 2016-04-15 HISTORY — DX: Low back pain: M54.5

## 2016-04-15 LAB — I-STAT TROPONIN, ED
TROPONIN I, POC: 0 ng/mL (ref 0.00–0.08)
Troponin i, poc: 0 ng/mL (ref 0.00–0.08)

## 2016-04-15 LAB — BASIC METABOLIC PANEL
Anion gap: 8 (ref 5–15)
BUN: 9 mg/dL (ref 6–20)
CALCIUM: 8.8 mg/dL — AB (ref 8.9–10.3)
CHLORIDE: 104 mmol/L (ref 101–111)
CO2: 27 mmol/L (ref 22–32)
CREATININE: 1.24 mg/dL — AB (ref 0.44–1.00)
GFR calc non Af Amer: 49 mL/min — ABNORMAL LOW (ref >60)
GFR, EST AFRICAN AMERICAN: 57 mL/min — AB (ref >60)
GLUCOSE: 96 mg/dL (ref 65–99)
Potassium: 3.5 mmol/L (ref 3.5–5.1)
Sodium: 139 mmol/L (ref 135–145)

## 2016-04-15 LAB — CBC
HCT: 36.2 % (ref 36.0–46.0)
Hemoglobin: 10.8 g/dL — ABNORMAL LOW (ref 12.0–15.0)
MCH: 25.4 pg — AB (ref 26.0–34.0)
MCHC: 29.8 g/dL — AB (ref 30.0–36.0)
MCV: 85.2 fL (ref 78.0–100.0)
PLATELETS: 230 10*3/uL (ref 150–400)
RBC: 4.25 MIL/uL (ref 3.87–5.11)
RDW: 17.5 % — ABNORMAL HIGH (ref 11.5–15.5)
WBC: 8.3 10*3/uL (ref 4.0–10.5)

## 2016-04-15 MED ORDER — SODIUM CHLORIDE 0.9 % IV BOLUS (SEPSIS)
1000.0000 mL | Freq: Once | INTRAVENOUS | Status: AC
  Administered 2016-04-15: 1000 mL via INTRAVENOUS

## 2016-04-15 MED ORDER — MORPHINE SULFATE (PF) 2 MG/ML IV SOLN
2.0000 mg | Freq: Once | INTRAVENOUS | Status: AC
  Administered 2016-04-15: 2 mg via INTRAVENOUS
  Filled 2016-04-15: qty 1

## 2016-04-15 MED ORDER — MORPHINE SULFATE (PF) 2 MG/ML IV SOLN: 2.0000 mg | INTRAVENOUS | Status: DC | PRN

## 2016-04-15 MED ORDER — ALPRAZOLAM 0.25 MG PO TABS: 0.2500 mg | ORAL_TABLET | Freq: Two times a day (BID) | ORAL | Status: DC | PRN

## 2016-04-15 MED ORDER — ENOXAPARIN SODIUM 40 MG/0.4ML ~~LOC~~ SOLN
40.0000 mg | Freq: Every day | SUBCUTANEOUS | Status: DC
  Administered 2016-04-16: 40 mg via SUBCUTANEOUS
  Filled 2016-04-15 (×2): qty 0.4

## 2016-04-15 MED ORDER — NITROGLYCERIN 0.4 MG SL SUBL
0.4000 mg | SUBLINGUAL_TABLET | SUBLINGUAL | Status: DC | PRN
Start: 2016-04-15 — End: 2016-04-16
  Administered 2016-04-15 (×2): 0.4 mg via SUBLINGUAL
  Filled 2016-04-15 (×2): qty 1

## 2016-04-15 MED ORDER — ONDANSETRON HCL 4 MG/2ML IJ SOLN: 4.0000 mg | Freq: Four times a day (QID) | INTRAMUSCULAR | Status: DC | PRN

## 2016-04-15 MED ORDER — ACETAMINOPHEN 325 MG PO TABS: 650.0000 mg | ORAL_TABLET | Freq: Four times a day (QID) | ORAL | Status: DC | PRN

## 2016-04-15 MED ORDER — SODIUM CHLORIDE 0.9% FLUSH
3.0000 mL | Freq: Two times a day (BID) | INTRAVENOUS | Status: DC
  Administered 2016-04-15 – 2016-04-16 (×2): 3 mL via INTRAVENOUS

## 2016-04-15 NOTE — ED Triage Notes (Addendum)
Pt here with central cp since yesterday. Denies n/v. Feels sob. Denies cardiac hx. No radiation of pain. Skin and dry. In NAD. Has been taking prilosec for GERD without relief.

## 2016-04-15 NOTE — ED Provider Notes (Signed)
[facility]-EMERGENCY DEPT Provider Note   CSN: [id number] Arrival date & time: 04/15/16  1443     History   Chief Complaint Chief Complaint  Patient presents with  . Chest Pain    HPI Tabitha Scott is a 50 y.o. female.  HPI  Patient With multiple comorbidities including diabetes, hyperlipidemia, obesity, tobacco use presents for chest pain. It began yesterday, worsened today. She has shortness of breath. Source with exertion. It central chest, pressure-like, 10 out of 10. Associated nausea.   Past Medical History:  Diagnosis Date  . Arthritis   . COPD (chronic obstructive pulmonary disease) (HCC)   . Diabetes mellitus without complication (HCC)   . Hyperlipidemia   . Neuropathy (HCC)   . Oxygen deficiency    prescribed but doesn't use regularly  . Thyroid disease     Patient Active Problem List   Diagnosis Date Noted  . Hypokalemia 10/18/2015  . ARF (acute renal failure) (HCC) 10/18/2015  . Diabetes mellitus type 2, controlled (HCC) 10/18/2015  . Cough   . Acute respiratory failure with hypoxia (HCC)   . OSA on CPAP   . Morbid obesity (HCC)   . Diabetes mellitus type 2, noninsulin dependent (HCC)   . Diabetes (HCC) 04/09/2015  . Hypothyroidism 04/09/2015  . Pedal edema 04/09/2015  . DDD (degenerative disc disease), lumbar 04/09/2015  . Pure hypercholesterolemia 04/09/2015    Past Surgical History:  Procedure Laterality Date  . arm surgery  2009   nerver repair to both arms   . CARPAL TUNNEL RELEASE  2009  . CHOLECYSTECTOMY    . KNEE SURGERY     tissue removed, 2011?    OB History    No data available       Home Medications    Prior to Admission medications   Medication Sig Start Date End Date Taking? Authorizing Provider  albuterol (PROVENTIL HFA;VENTOLIN HFA) 108 (90 Base) MCG/ACT inhaler Inhale 1-2 puffs into the lungs every 6 (six) hours as needed for wheezing or shortness of breath. 10/06/15   Ace GinsSerena Y Sam, PA-C  atorvastatin (LIPITOR) 40 MG tablet  Take 1 tablet (40 mg total) by mouth daily. 10/22/15   Henrietta HooverLinda C Bernhardt, NP  diazepam (VALIUM) 5 MG tablet Take 1 tablet (5 mg total) by mouth 2 (two) times daily. 12/21/15   Melton KrebsSamantha Nicole Riley, PA-C  ferrous sulfate 325 (65 FE) MG tablet Take 1 tablet (325 mg total) by mouth daily with breakfast. 08/22/15   Henrietta HooverLinda C Bernhardt, NP  gabapentin (NEURONTIN) 300 MG capsule Take 1 capsule (300 mg total) by mouth 3 (three) times daily. 12/26/15   Henrietta HooverLinda C Bernhardt, NP  HYDROcodone-acetaminophen (NORCO/VICODIN) 5-325 MG tablet Take 1-2 tablets by mouth every 6 (six) hours as needed. 02/11/16   Roxy Horsemanobert Browning, PA-C  ibuprofen (ADVIL,MOTRIN) 600 MG tablet Take 1 tablet (600 mg total) by mouth every 6 (six) hours as needed. 02/11/16   Roxy Horsemanobert Browning, PA-C  Insulin Pen Needle (B-D UF III MINI PEN NEEDLES) 31G X 5 MM MISC 1 each by Does not apply route daily. 02/20/16   Henrietta HooverLinda C Bernhardt, NP  levothyroxine (SYNTHROID, LEVOTHROID) 25 MCG tablet Take 1 tablet (25 mcg total) by mouth daily before breakfast. 04/09/15   Henrietta HooverLinda C Bernhardt, NP  linagliptin (TRADJENTA) 5 MG TABS tablet Take 1 tablet (5 mg total) by mouth daily. 10/01/15   Henrietta HooverLinda C Bernhardt, NP  Liraglutide (VICTOZA) 18 MG/3ML SOPN Inject 0.3 mLs (1.8 mg total) into the skin daily. 10/24/15   Bonita QuinLinda  Cherre Blanc Bernhardt, NP  naproxen (NAPROSYN) 500 MG tablet Take 1 tablet (500 mg total) by mouth 2 (two) times daily. 12/21/15   Melton KrebsSamantha Nicole Riley, PA-C  omeprazole (PRILOSEC) 40 MG capsule Take 1 capsule (40 mg total) by mouth daily. 05/02/15   Henrietta HooverLinda C Bernhardt, NP  predniSONE (DELTASONE) 20 MG tablet Take 2 tablets (40 mg total) by mouth daily with breakfast. Patient not taking: Reported on 12/26/2015 11/27/15   Belkys A Regalado, MD  spironolactone (ALDACTONE) 25 MG tablet Take 0.5 tablets (12.5 mg total) by mouth daily. Patient not taking: Reported on 12/26/2015 11/27/15   Belkys A Regalado, MD  tiZANidine (ZANAFLEX) 4 MG capsule Take 1 capsule (4 mg total) by mouth 3 (three)  times daily as needed for muscle spasms. 10/24/15   Henrietta HooverLinda C Bernhardt, NP    Family History Family History  Problem Relation Age of Onset  . COPD Mother   . Heart disease Father   . Cancer Maternal Grandmother     Social History Social History  Substance Use Topics  . Smoking status: Former Smoker    Quit date: 01/11/2015  . Smokeless tobacco: Not on file  . Alcohol use No     Allergies   Tetanus toxoids   Review of Systems Review of Systems  Constitutional: Negative for chills and fever.  HENT: Negative for ear pain and sore throat.   Eyes: Negative for pain and visual disturbance.  Respiratory: Positive for shortness of breath. Negative for cough.   Cardiovascular: Positive for chest pain. Negative for palpitations.  Gastrointestinal: Positive for nausea. Negative for abdominal pain and vomiting.  Genitourinary: Negative for dysuria and hematuria.  Musculoskeletal: Negative for arthralgias and back pain.  Skin: Negative for color change and rash.  Neurological: Negative for seizures and syncope.  All other systems reviewed and are negative.    Physical Exam Updated Vital Signs BP 126/68 (BP Location: Left Arm) Comment: Simultaneous filing. User may not have seen previous data.  Pulse 82 Comment: Simultaneous filing. User may not have seen previous data.  Temp 98.6 F (37 C) (Oral)   Resp 18 Comment: Simultaneous filing. User may not have seen previous data.  Ht 5\' 4"  (1.626 m)   Wt 129.3 kg   SpO2 94% Comment: Simultaneous filing. User may not have seen previous data.  BMI 48.92 kg/m   Physical Exam  Constitutional: She appears well-developed and well-nourished. No distress.  HENT:  Head: Normocephalic and atraumatic.  Eyes: Conjunctivae are normal.  Neck: Neck supple.  Cardiovascular: Normal rate and regular rhythm.   No murmur heard. Pulmonary/Chest: Effort normal and breath sounds normal. No respiratory distress.  Abdominal: Soft. There is no  tenderness.  Musculoskeletal: She exhibits no edema.  Neurological: She is alert.  Skin: Skin is warm and dry.  Psychiatric: She has a normal mood and affect.  Nursing note and vitals reviewed.    ED Treatments / Results  Labs (all labs ordered are listed, but only abnormal results are displayed) Labs Reviewed  BASIC METABOLIC PANEL - Abnormal; Notable for the following:       Result Value   Creatinine, Ser 1.24 (*)    Calcium 8.8 (*)    GFR calc non Af Amer 49 (*)    GFR calc Af Amer 57 (*)    All other components within normal limits  CBC - Abnormal; Notable for the following:    Hemoglobin 10.8 (*)    MCH 25.4 (*)    MCHC 29.8 (*)  RDW 17.5 (*)    All other components within normal limits  I-STAT TROPOININ, ED  I-STAT TROPOININ, ED   EKG  EKG Interpretation  Date/Time:  Wednesday April 15 2016 15:02:22 EDT Ventricular Rate:  92 PR Interval:  142 QRS Duration: 78 QT Interval:  360 QTC Calculation: 445 R Axis:   11 Text Interpretation:  Normal sinus rhythm Low voltage QRS Borderline ECG No significant change since last tracing Confirmed by Rhunette Croft, MD, Janey Genta [phone]) on 04/15/2016 7:16:36 PM       Radiology Dg Chest 2 View  Result Date: 04/15/2016 CLINICAL DATA:  Central chest pain since yesterday. Shortness of breath. EXAM: CHEST  2 VIEW COMPARISON:  11/25/2015 and 10/18/2015 radiographs. FINDINGS: The heart size and mediastinal contours are stable. There is stable chronic interstitial prominence and central airway thickening. No edema, confluent airspace opacity or pleural effusion. The bones appear unchanged. IMPRESSION: Stable chest with chronic interstitial prominence and central airway thickening. No acute findings demonstrated. Electronically Signed   By: Carey Bullocks M.D.   On: 04/15/2016 15:49    Procedures Procedures (including critical care time)  Medications Ordered in ED Medications  nitroGLYCERIN (NITROSTAT) SL tablet 0.4 mg (0.4 mg  Sublingual Given 04/15/16 2100)  sodium chloride 0.9 % bolus 1,000 mL (0 mLs Intravenous Stopped 04/15/16 2101)     Initial Impression / Assessment and Plan / ED Course  I have reviewed the triage vital signs and the nursing notes.  Pertinent labs & imaging results that were available during my care of the patient were reviewed by me and considered in my medical decision making (see chart for details).  Clinical Course     Patient presents for evaluation of chest pain.  HEART score is 5, moderate risk.  Troponin negative x2.  EKG is NSR inverted T wave in III.  Feel like PNA unlikely given CXR without infiltrates or effusion, no leukocytosis, no fever.  No mediastinal widening on CXR.  Doubt emergent intraabdominal pathology given benign abdominal exam and no abdominal symptoms.  Low risk [place].  Admitted for ACS ruleout.  Final Clinical Impressions(s) / ED Diagnoses   Final diagnoses:  None    New Prescriptions New Prescriptions   No medications on file     Marcelina Morel, MD 04/15/16 6045    Derwood Kaplan, MD 04/16/16 4098

## 2016-04-15 NOTE — ED Notes (Addendum)
Disregard previous note - Child psychotherapistsocial worker gave wrong patient's name.

## 2016-04-15 NOTE — H&P (Signed)
History and Physical    Tabitha BouillonKim Rexroad BJY:[id number] DOB: 1966/04/05 DOA: 04/15/2016  PCP: Concepcion LivingBERNHARDT, LINDA, NP   Patient coming from: Home.  Chief Complaint: Chest pain.  HPI: Tabitha Scott is a 50 y.o. female with medical history significant of osteoarthritis, COPD, type 2 diabetes, hyperlipidemia, neuropathy, hypothyroidism comes emergency department with chest pain since early afternoon to stay 04/14/2016.  Per patient, early afternoon yesterday, while she was holding her grand baby, she developed acute sharp, left-sided chest pain, nonradiating, worsened by movement and exertion associated with dyspnea and mild dizziness, but denies nausea, emesis, palpitations, diaphoresis. She denies recent pitting edema of the lower extremities, PND or orthopnea. She has a history of diabetes, obesity and hyperlipidemia. She is a former smoker since age 50 until June last year. Her father had a history of heart disease.  ED Course: Troponin levels so far have been negative.  EKG is NSR inverted T wave in III. her hemoglobin level is 10.0 and 10.8 g/dL. Chest radiograph does not show any acute findings.   Review of Systems: As per HPI otherwise 10 point review of systems negative.     Past Medical History:  Diagnosis Date  . Arthritis   . COPD (chronic obstructive pulmonary disease) (HCC)   . Diabetes mellitus without complication (HCC)   . Hyperlipidemia   . Neuropathy (HCC)   . Oxygen deficiency    prescribed but doesn't use regularly  . Thyroid disease     Past Surgical History:  Procedure Laterality Date  . arm surgery  2009   nerver repair to both arms   . CARPAL TUNNEL RELEASE  2009  . CHOLECYSTECTOMY    . KNEE SURGERY     tissue removed, 2011?     reports that she quit smoking about 15 months ago. She does not have any smokeless tobacco history on file. She reports that she does not drink alcohol or use drugs.  Allergies  Allergen Reactions  . Tetanus Toxoids Swelling     Family History  Problem Relation Age of Onset  . COPD Mother   . Heart disease Father   . Cancer Maternal Grandmother       Prior to Admission medications   Medication Sig Start Date End Date Taking? Authorizing Provider  albuterol (PROVENTIL HFA;VENTOLIN HFA) 108 (90 Base) MCG/ACT inhaler Inhale 1-2 puffs into the lungs every 6 (six) hours as needed for wheezing or shortness of breath. 10/06/15  Yes Ace GinsSerena Y Sam, PA-C  atorvastatin (LIPITOR) 40 MG tablet Take 1 tablet (40 mg total) by mouth daily. 10/22/15  Yes Henrietta HooverLinda C Bernhardt, NP  diazepam (VALIUM) 5 MG tablet Take 1 tablet (5 mg total) by mouth 2 (two) times daily. 12/21/15  Yes Melton KrebsSamantha Nicole Riley, PA-C  ferrous sulfate 325 (65 FE) MG tablet Take 1 tablet (325 mg total) by mouth daily with breakfast. 08/22/15  Yes Henrietta HooverLinda C Bernhardt, NP  gabapentin (NEURONTIN) 300 MG capsule Take 1 capsule (300 mg total) by mouth 3 (three) times daily. 12/26/15  Yes Henrietta HooverLinda C Bernhardt, NP  HYDROcodone-acetaminophen (NORCO/VICODIN) 5-325 MG tablet Take 1-2 tablets by mouth every 6 (six) hours as needed. 02/11/16  Yes Roxy Horsemanobert Browning, PA-C  ibuprofen (ADVIL,MOTRIN) 600 MG tablet Take 1 tablet (600 mg total) by mouth every 6 (six) hours as needed. 02/11/16  Yes Roxy Horsemanobert Browning, PA-C  levothyroxine (SYNTHROID, LEVOTHROID) 25 MCG tablet Take 1 tablet (25 mcg total) by mouth daily before breakfast. 04/09/15  Yes Henrietta HooverLinda C Bernhardt, NP  linagliptin (TRADJENTA)  5 MG TABS tablet Take 1 tablet (5 mg total) by mouth daily. 10/01/15  Yes Henrietta Hoover, NP  Liraglutide (VICTOZA) 18 MG/3ML SOPN Inject 0.3 mLs (1.8 mg total) into the skin daily. 10/24/15  Yes Henrietta Hoover, NP  naproxen (NAPROSYN) 500 MG tablet Take 1 tablet (500 mg total) by mouth 2 (two) times daily. 12/21/15  Yes Melton Krebs, PA-C  omeprazole (PRILOSEC) 40 MG capsule Take 1 capsule (40 mg total) by mouth daily. 05/02/15  Yes Henrietta Hoover, NP  tiZANidine (ZANAFLEX) 4 MG capsule Take 1  capsule (4 mg total) by mouth 3 (three) times daily as needed for muscle spasms. 10/24/15  Yes Henrietta Hoover, NP  Insulin Pen Needle (B-D UF III MINI PEN NEEDLES) 31G X 5 MM MISC 1 each by Does not apply route daily. 02/20/16   Henrietta Hoover, NP  predniSONE (DELTASONE) 20 MG tablet Take 2 tablets (40 mg total) by mouth daily with breakfast. Patient not taking: Reported on 12/26/2015 11/27/15   Belkys A Regalado, MD  spironolactone (ALDACTONE) 25 MG tablet Take 0.5 tablets (12.5 mg total) by mouth daily. Patient not taking: Reported on 12/26/2015 11/27/15   Alba Cory, MD    Physical Exam: Vitals:   04/15/16 2130 04/15/16 2145 04/15/16 2200 04/15/16 2215  BP: 133/67 120/57 118/64 126/65  Pulse: 80 81 80 80  Resp: 19 17 15 17   Temp:      TempSrc:      SpO2: 98% 94% 96% 95%  Weight:      Height:          Constitutional: NAD, calm, comfortable Vitals:   04/15/16 2130 04/15/16 2145 04/15/16 2200 04/15/16 2215  BP: 133/67 120/57 118/64 126/65  Pulse: 80 81 80 80  Resp: 19 17 15 17   Temp:      TempSrc:      SpO2: 98% 94% 96% 95%  Weight:      Height:       Eyes: PERRL, lids and conjunctivae normal ENMT: Mucous membranes are moist. Posterior pharynx clear of any exudate or lesions.Absent dentition.  Neck: normal, supple, no masses, no thyromegaly Respiratory: clear to auscultation bilaterally, no wheezing, no crackles. Normal respiratory effort. No accessory muscle use.  Cardiovascular: Regular rate and rhythm, no murmurs / rubs / gallops. No extremity edema. 2+ pedal pulses. No carotid bruits.  Abdomen: Obese, Bowel sounds positive. Soft, no tenderness, no masses palpated. No hepatosplenomegaly. Musculoskeletal: no clubbing / cyanosis. No joint deformity upper and lower extremities. Good ROM, no contractures. Normal muscle tone.  Skin: no rashes, lesions, ulcers. No induration Neurologic: CN 2-12 grossly intact. Sensation intact, DTR normal. Strength 5/5 in all 4.   Psychiatric: Normal judgment and insight. Alert and oriented x 4. Normal mood.      Labs on Admission: I have personally reviewed following labs and imaging studies  CBC:  Recent Labs Lab 04/15/16 1530  WBC 8.3  HGB 10.8*  HCT 36.2  MCV 85.2  PLT 230   Basic Metabolic Panel:  Recent Labs Lab 04/15/16 1530  NA 139  K 3.5  CL 104  CO2 27  GLUCOSE 96  BUN 9  CREATININE 1.24*  CALCIUM 8.8*   GFR: Estimated Creatinine Clearance: 70.8 mL/min (by C-G formula based on SCr of 1.24 mg/dL).  Urine analysis:    Component Value Date/Time   COLORURINE YELLOW 03/30/2015 1515   APPEARANCEUR CLOUDY (A) 03/30/2015 1515   LABSPEC 1.008 03/30/2015 1515   PHURINE  5.0 03/30/2015 1515   GLUCOSEU NEGATIVE 03/30/2015 1515   HGBUR NEGATIVE 03/30/2015 1515   BILIRUBINUR NEGATIVE 03/30/2015 1515   KETONESUR NEGATIVE 03/30/2015 1515   PROTEINUR NEGATIVE 03/30/2015 1515   UROBILINOGEN 0.2 03/30/2015 1515   NITRITE NEGATIVE 03/30/2015 1515   LEUKOCYTESUR NEGATIVE 03/30/2015 1515      Radiological Exams on Admission: Dg Chest 2 View  Result Date: 04/15/2016 CLINICAL DATA:  Central chest pain since yesterday. Shortness of breath. EXAM: CHEST  2 VIEW COMPARISON:  11/25/2015 and 10/18/2015 radiographs. FINDINGS: The heart size and mediastinal contours are stable. There is stable chronic interstitial prominence and central airway thickening. No edema, confluent airspace opacity or pleural effusion. The bones appear unchanged. IMPRESSION: Stable chest with chronic interstitial prominence and central airway thickening. No acute findings demonstrated. Electronically Signed   By: Carey BullocksWilliam  Veazey M.D.   On: 04/15/2016 15:49    EKG: Independently reviewed. Vent. rate 92 BPM PR interval 142 ms QRS duration 78 ms QT/QTc 360/445 ms P-R-T axes 39 11 0 Normal sinus rhythm Low voltage QRS Borderline ECG No significant change since last tracing   Assessment/Plan Principal problem:   Chest  pain Telemetry/observation. Continue aspirin and atorvastatin. Trend troponin levels. EKG and echocardiogram in a.m. Stress test as in or outpatient.  Active Problems:   Hypothyroidism Continue Synthroid 25 mg by mouth daily. Monitor TSH.    Pure hypercholesterolemia Continue atorvastatin 40 mg by mouth daily. Monitor LFTs periodically.     Diabetes mellitus type 2, noninsulin dependent (HCC) Carbohydrate modified diet. CBG monitoring for meals    Anemia Check anemia panel. Continue iron supplementation. Monitor hematocrit and hemoglobin.    DVT prophylaxis: Lovenox SQ. Code Status: Full code. Family Communication:  Disposition Plan: Admit to telemetry overnight for troponin trending. Consults called:  Admission status: Observation/telemetry.   Bobette Moavid Manuel Alinna Siple MD [facility] Pager [phone].  If 7PM-7AM, please contact night-coverage [email or web address] Password [email or web address]  04/15/2016, 11:01 PM

## 2016-04-16 ENCOUNTER — Other Ambulatory Visit: Payer: Self-pay | Admitting: Cardiology

## 2016-04-16 ENCOUNTER — Observation Stay (HOSPITAL_BASED_OUTPATIENT_CLINIC_OR_DEPARTMENT_OTHER): Payer: Medicaid Other

## 2016-04-16 ENCOUNTER — Encounter (HOSPITAL_COMMUNITY): Payer: Self-pay | Admitting: Internal Medicine

## 2016-04-16 DIAGNOSIS — E038 Other specified hypothyroidism: Secondary | ICD-10-CM | POA: Diagnosis not present

## 2016-04-16 DIAGNOSIS — J439 Emphysema, unspecified: Secondary | ICD-10-CM

## 2016-04-16 DIAGNOSIS — R079 Chest pain, unspecified: Secondary | ICD-10-CM

## 2016-04-16 DIAGNOSIS — E7800 Pure hypercholesterolemia, unspecified: Secondary | ICD-10-CM

## 2016-04-16 DIAGNOSIS — R072 Precordial pain: Secondary | ICD-10-CM | POA: Diagnosis not present

## 2016-04-16 DIAGNOSIS — E039 Hypothyroidism, unspecified: Secondary | ICD-10-CM | POA: Diagnosis not present

## 2016-04-16 DIAGNOSIS — K219 Gastro-esophageal reflux disease without esophagitis: Secondary | ICD-10-CM | POA: Diagnosis not present

## 2016-04-16 DIAGNOSIS — E119 Type 2 diabetes mellitus without complications: Secondary | ICD-10-CM

## 2016-04-16 DIAGNOSIS — R0789 Other chest pain: Secondary | ICD-10-CM | POA: Diagnosis not present

## 2016-04-16 LAB — TROPONIN I
Troponin I: <0.03 ng/mL (ref <0.03)
Troponin I: <0.03 ng/mL (ref <0.03)

## 2016-04-16 LAB — ECHOCARDIOGRAM COMPLETE
HEIGHTINCHES: 64.5 in
WEIGHTICAEL: 4993.6 [oz_av]

## 2016-04-16 LAB — COMPREHENSIVE METABOLIC PANEL
ALBUMIN: 3.3 g/dL — AB (ref 3.5–5.0)
ALK PHOS: 79 U/L (ref 38–126)
ALT: 38 U/L (ref 14–54)
AST: 41 U/L (ref 15–41)
Anion gap: 9 (ref 5–15)
BILIRUBIN TOTAL: 0.8 mg/dL (ref 0.3–1.2)
BUN: 10 mg/dL (ref 6–20)
CALCIUM: 8.5 mg/dL — AB (ref 8.9–10.3)
CO2: 22 mmol/L (ref 22–32)
CREATININE: 1.16 mg/dL — AB (ref 0.44–1.00)
Chloride: 107 mmol/L (ref 101–111)
GFR calc Af Amer: >60 mL/min (ref >60)
GFR, EST NON AFRICAN AMERICAN: 53 mL/min — AB (ref >60)
GLUCOSE: 108 mg/dL — AB (ref 65–99)
POTASSIUM: 3.9 mmol/L (ref 3.5–5.1)
Sodium: 138 mmol/L (ref 135–145)
TOTAL PROTEIN: 7.5 g/dL (ref 6.5–8.1)

## 2016-04-16 LAB — CBC
HCT: 35 % — ABNORMAL LOW (ref 36.0–46.0)
HEMATOCRIT: 34.4 % — AB (ref 36.0–46.0)
Hemoglobin: 10 g/dL — ABNORMAL LOW (ref 12.0–15.0)
Hemoglobin: 10.6 g/dL — ABNORMAL LOW (ref 12.0–15.0)
MCH: 25.1 pg — ABNORMAL LOW (ref 26.0–34.0)
MCH: 25.7 pg — AB (ref 26.0–34.0)
MCHC: 29.1 g/dL — AB (ref 30.0–36.0)
MCHC: 30.3 g/dL (ref 30.0–36.0)
MCV: 84.7 fL (ref 78.0–100.0)
MCV: 86.4 fL (ref 78.0–100.0)
PLATELETS: 180 10*3/uL (ref 150–400)
PLATELETS: 186 10*3/uL (ref 150–400)
RBC: 3.98 MIL/uL (ref 3.87–5.11)
RBC: 4.13 MIL/uL (ref 3.87–5.11)
RDW: 17.7 % — AB (ref 11.5–15.5)
RDW: 17.8 % — AB (ref 11.5–15.5)
WBC: 7.4 10*3/uL (ref 4.0–10.5)
WBC: 7.7 10*3/uL (ref 4.0–10.5)

## 2016-04-16 LAB — GLUCOSE, CAPILLARY
GLUCOSE-CAPILLARY: 105 mg/dL — AB (ref 65–99)
GLUCOSE-CAPILLARY: 109 mg/dL — AB (ref 65–99)
Glucose-Capillary: 108 mg/dL — ABNORMAL HIGH (ref 65–99)
Glucose-Capillary: 113 mg/dL — ABNORMAL HIGH (ref 65–99)

## 2016-04-16 LAB — IRON AND TIBC
IRON: 109 ug/dL (ref 28–170)
SATURATION RATIOS: 31 % (ref 10.4–31.8)
TIBC: 356 ug/dL (ref 250–450)
UIBC: 247 ug/dL

## 2016-04-16 LAB — CREATININE, SERUM
Creatinine, Ser: 1.24 mg/dL — ABNORMAL HIGH (ref 0.44–1.00)
GFR, EST AFRICAN AMERICAN: 57 mL/min — AB (ref >60)
GFR, EST NON AFRICAN AMERICAN: 49 mL/min — AB (ref >60)

## 2016-04-16 LAB — FERRITIN: FERRITIN: 9 ng/mL — AB (ref 11–307)

## 2016-04-16 LAB — RETICULOCYTES
RBC.: 4.13 MIL/uL (ref 3.87–5.11)
RETIC CT PCT: 2.6 % (ref 0.4–3.1)
Retic Count, Absolute: 107.4 10*3/uL (ref 19.0–186.0)

## 2016-04-16 LAB — VITAMIN B12: Vitamin B-12: 391 pg/mL (ref 180–914)

## 2016-04-16 LAB — FOLATE: Folate: 31.2 ng/mL (ref >5.9)

## 2016-04-16 LAB — MRSA PCR SCREENING: MRSA BY PCR: POSITIVE — AB

## 2016-04-16 MED ORDER — OMEPRAZOLE 40 MG PO CPDR: 40.0000 mg | DELAYED_RELEASE_CAPSULE | Freq: Two times a day (BID) | ORAL | 1 refills | Status: DC

## 2016-04-16 MED ORDER — ASPIRIN EC 81 MG PO TBEC: 81.0000 mg | DELAYED_RELEASE_TABLET | Freq: Every day | ORAL | 1 refills | Status: DC

## 2016-04-16 MED ORDER — ALBUTEROL SULFATE (2.5 MG/3ML) 0.083% IN NEBU: 2.5000 mg | INHALATION_SOLUTION | Freq: Four times a day (QID) | RESPIRATORY_TRACT | Status: DC | PRN

## 2016-04-16 MED ORDER — GABAPENTIN 300 MG PO CAPS
300.0000 mg | ORAL_CAPSULE | Freq: Three times a day (TID) | ORAL | Status: DC
  Administered 2016-04-16 (×2): 300 mg via ORAL
  Filled 2016-04-16 (×2): qty 1

## 2016-04-16 MED ORDER — NAPROXEN 500 MG PO TABS: 500.0000 mg | ORAL_TABLET | Freq: Two times a day (BID) | ORAL | Status: DC | PRN

## 2016-04-16 MED ORDER — LEVOTHYROXINE SODIUM 25 MCG PO TABS
25.0000 ug | ORAL_TABLET | Freq: Every day | ORAL | Status: DC
  Administered 2016-04-16: 25 ug via ORAL
  Filled 2016-04-16: qty 1

## 2016-04-16 MED ORDER — PANTOPRAZOLE SODIUM 40 MG PO TBEC
40.0000 mg | DELAYED_RELEASE_TABLET | Freq: Two times a day (BID) | ORAL | Status: DC
  Administered 2016-04-16: 40 mg via ORAL
  Filled 2016-04-16: qty 1

## 2016-04-16 MED ORDER — LINAGLIPTIN 5 MG PO TABS
5.0000 mg | ORAL_TABLET | Freq: Every day | ORAL | Status: DC
  Administered 2016-04-16: 5 mg via ORAL
  Filled 2016-04-16: qty 1

## 2016-04-16 MED ORDER — PANTOPRAZOLE SODIUM 40 MG PO TBEC: 40.0000 mg | DELAYED_RELEASE_TABLET | Freq: Every day | ORAL | Status: DC

## 2016-04-16 MED ORDER — ATORVASTATIN CALCIUM 40 MG PO TABS
40.0000 mg | ORAL_TABLET | Freq: Every day | ORAL | Status: DC
  Administered 2016-04-16: 40 mg via ORAL
  Filled 2016-04-16: qty 1

## 2016-04-16 MED ORDER — TIZANIDINE HCL 4 MG PO TABS: 4.0000 mg | ORAL_TABLET | Freq: Three times a day (TID) | ORAL | Status: DC | PRN

## 2016-04-16 MED ORDER — LIRAGLUTIDE 18 MG/3ML ~~LOC~~ SOPN: 1.8000 mg | PEN_INJECTOR | Freq: Every day | SUBCUTANEOUS | Status: DC

## 2016-04-16 MED ORDER — FERROUS SULFATE 325 (65 FE) MG PO TABS
325.0000 mg | ORAL_TABLET | Freq: Every day | ORAL | Status: DC
  Administered 2016-04-16: 325 mg via ORAL
  Filled 2016-04-16: qty 1

## 2016-04-16 NOTE — Progress Notes (Signed)
Patient is requesting to change her PCP; CM talked to patient and she has lots of complaints about her PCP and she [facility] and her PCP will not put in a referral for her to go there.  Payer source is Medicaid, CM instructed patient to call the number on her Medicaid card to see about change her PCP; CM also informed patient that I cannot make a referral to the [facility], that must come from her PCP.   Member: Tabitha Scott,Tabitha Scott [[id number]]    Plan: MEDICAID [place] ACCESS [246* Payor: MEDICAID [place] [[id number]]     Coverage Information   Coverage information:    Subscriber: [id number] [name],Tabitha Scott    Rel to sub: 01 - Self    Member ID: [id number]    Payor: 246-MEDICAID [place]    Benefit plan: [id number]-MEDICAID [place] ACCESS Ph: [phone]    Group number: Not given    Member effective dates: Not given

## 2016-04-16 NOTE — Progress Notes (Signed)
  Echocardiogram 2D Echocardiogram has been performed.  Tabitha Scott, Tabitha Scott R 04/16/2016, 4:29 PM

## 2016-04-16 NOTE — Progress Notes (Signed)
Pt. Arrived to the unit via stretcher this am in alert and stable condition. No s/s of distress or discomfort noted. Pt. Denies pain. VSS. Pt. Placed on telemetry monitor. CCMD notified. Call light placed within reach.  Pt. Fall prevention safety plan discussed, pt. Verbalized understanding. Pt. Now resting comfortably in bed. RN will continue to monitor for changes in condition. Tabitha Scott, Cheryll DessertKaren Cherrell

## 2016-04-16 NOTE — Consult Note (Signed)
Cardiology Consult    Patient ID: Tabitha Scott MRN: [id number], DOB/AGE: Nov 15, 1965   Admit date: 04/15/2016 Date of Consult: 04/16/2016  Primary Physician: [name], NP Primary Cardiologist: New Requesting Provider: Dr. [name] Reason for Consultation: Chest pain  Patient Profile    50 yo female with PMH of HTN/HLD/DM II/COPD/hypothyroidism and tobacco abuse who presented to the [facility] ED with chest pain and dyspnea over the past couple of days.   Past Medical History   Past Medical History:  Diagnosis Date  . Arthritis   . COPD (chronic obstructive pulmonary disease) (HCC)   . Diabetes mellitus without complication (HCC)   . Hyperlipidemia   . Neuropathy (HCC)   . Oxygen deficiency    prescribed but doesn't use regularly  . Thyroid disease     Past Surgical History:  Procedure Laterality Date  . arm surgery  2009   nerver repair to both arms   . CARPAL TUNNEL RELEASE  2009  . CHOLECYSTECTOMY    . HAND SURGERY Left 2015  . KNEE SURGERY     tissue removed, 2011?     Allergies  Allergies  Allergen Reactions  . Tetanus Toxoids Swelling    History of Present Illness    Tabitha Scott is a 50 yo female with PMH of HTN/HLD/DM II/COPD/hypothyroidism and tobacco abuse. She reports never having had a cardiac work up in the past. She does report undergoing a nuclear stress test about 4 years ago at [facility] which to the best of her knowledge was normal. She does report a family medical history of CAD with her father (which she does not know) having an MI, age unknown. Also reports her grandfather passing from an MI in his 78s.   She reports developing centralized chest pressure on 04/14/2016 around 1 PM while she was sitting working on her laptop. Reports that was sharp in nature with associated dyspnea, and mild dizziness. States that the symptoms did slowly subside but were not fully relieved. Reports yesterday afternoon she was playing with her granddaughter and her  chest pain worsened in intensity. Stating it was still centralized with worsening dyspnea, but nonradiating. Reports chest pain was worse with inspiration. Denies any associated nausea, vomiting, palpitations, or diaphoresis. Does report a history of taking Lasix in the past, but states this was stopped during her last hospitalization. Reports she chronically has lower extremity edema which is no worse than normal. She states that she is normally physically active around her home, and does walk steps on a regular basis. Does report angina during episodes of climbing stairs recently. Also reports that she began smoking around age 65 but stopped a year ago last June.   In the ED her labs showed normal electrolytes, creatinine of 1.24, hemoglobin of 10.8, troponins and been cycled and negative x 3. Chest x-ray was negative. EKG showed sinus rhythm with T-wave inversion in lead III, aVR and V1 with T-wave flattening in aVF V2 V3 and V4, which appears similar to previous tracings. She reports receiving 2 sublingual nitroglycerin which relieved her pain from 8/10-4/10 and then received a dose of morphine which ultimately relieved her pain. She was admitted to Internal medicine for further management. Reports having a  recurrent episode of chest pain this morning that woke her up out of her sleep with associated dyspnea. States this lasted a short period of time.   Inpatient Medications    . atorvastatin  40 mg Oral q1800  . enoxaparin (LOVENOX) injection  40  mg Subcutaneous Daily  . ferrous sulfate  325 mg Oral Q breakfast  . gabapentin  300 mg Oral TID  . levothyroxine  25 mcg Oral QAC breakfast  . linagliptin  5 mg Oral Daily  . Liraglutide  1.8 mg Subcutaneous Daily  . pantoprazole  40 mg Oral BID  . sodium chloride flush  3 mL Intravenous Q12H    Family History    Family History  Problem Relation Age of Onset  . COPD Mother   . Heart disease Father   . Cancer Maternal Grandmother     Social  History    Social History   Social History  . Marital status: Single    Spouse name: N/A  . Number of children: N/A  . Years of education: N/A   Occupational History  . Not on file.   Social History Main Topics  . Smoking status: Former Smoker    Quit date: 01/11/2015  . Smokeless tobacco: Not on file  . Alcohol use No  . Drug use: No  . Sexual activity: No   Other Topics Concern  . Not on file   Social History Narrative  . No narrative on file     Review of Systems    General:  No chills, fever, night sweats or weight changes.  Cardiovascular:  See HPI Dermatological: No rash, lesions/masses Respiratory: No cough, dyspnea Urologic: No hematuria, dysuria Abdominal:   No nausea, vomiting, diarrhea, bright red blood per rectum, melena, or hematemesis Neurologic:  No visual changes, wkns, changes in mental status. All other systems reviewed and are otherwise negative except as noted above.  Physical Exam    Blood pressure 126/71, pulse 80, temperature 98.5 F (36.9 C), temperature source Oral, resp. rate 18, height 5' 4.5" (1.638 m), weight (!) 312 lb 1.6 oz (141.6 kg), SpO2 94 %.  General: Pleasant, Morbidly obese Caucasian female, NAD Psych: Normal affect. Neuro: Alert and oriented X 3. Moves all extremities spontaneously. HEENT: Normal  Neck: Supple without bruits or JVD. Lungs:  Resp regular and unlabored, CTA. Heart: RRR no s3, s4, or murmurs. Abdomen: Soft, non-tender, non-distended, BS + x 4.  Extremities: No clubbing, cyanosis or edema. DP/PT/Radials 2+ and equal bilaterally.  Labs    Troponin [facility](Point of Care Test)  Recent Labs  04/15/16 1905  TROPIPOC 0.00    Recent Labs  04/16/16 0040 04/16/16 0829  TROPONINI <0.03 <0.03   Lab Results  Component Value Date   WBC 7.7 04/16/2016   HGB 10.6 (L) 04/16/2016   HCT 35.0 (L) 04/16/2016   MCV 84.7 04/16/2016   PLT 180 04/16/2016    Recent Labs Lab 04/16/16 0829  NA 138  K 3.9  CL 107  CO2  22  BUN 10  CREATININE 1.16*  CALCIUM 8.5*  PROT 7.5  BILITOT 0.8  ALKPHOS 79  ALT 38  AST 41  GLUCOSE 108*   Lab Results  Component Value Date   CHOL 164 09/24/2015   HDL 31 (L) 09/24/2015   LDLCALC 81 09/24/2015   TRIG 260 (H) 09/24/2015   [facility] found [facility]: [facility]   Radiology Studies    Dg Chest 2 View  Result Date: 04/15/2016 CLINICAL DATA:  Central chest pain since yesterday. Shortness of breath. EXAM: CHEST  2 VIEW COMPARISON:  11/25/2015 and 10/18/2015 radiographs. FINDINGS: The heart size and mediastinal contours are stable. There is stable chronic interstitial prominence and central airway thickening. No edema, confluent airspace opacity or pleural effusion. The bones  appear unchanged. IMPRESSION: Stable chest with chronic interstitial prominence and central airway thickening. No acute findings demonstrated. Electronically Signed   By: Carey BullocksWilliam  Veazey M.D.   On: 04/15/2016 15:49    ECG & Cardiac Imaging    EKG: sinus rhythm with T-wave inversion in lead III, aVR and V1 with T-wave flattening in aVF V2 V3 and V4  Echo: Pending  Assessment & Plan    50 yo female with PMH of HTN/HLD/DM II/COPD/hypothyroidism and tobacco abuse who [facility]  with chest pain and dyspnea over the past couple of days.   1. Chest pain with moderate risk: Reports intermittent episodes of both atypical and typical chest pain over the past couple of weeks, which became worse on 8/22 around 1pm while she was sitting using her laptop. Reports the symptoms did subside but did not completely resolve. Reports symptoms worsened again yesterday afternoon while she was playing with her grandchild. Presented to the ED, trop neg x3, normal electrolytes. Hgb noted at 10.8, with iron panel showing low ferritin. Chest x-ray was negative. Given 2 SL nitro and morphine which relieved her pain.  -- she does have concerning risk factors including HTN, HLD, IDDM II and 40 year hx of smoking,  along with family risk factors.  -- Would benefit from stress test, but given her weight this would need to be a 2 day study. Believe this could be done in the outpatient setting. 2D echo today is still pending  2. HTN: Reports being on medications in the past, but not recently. Appears controlled during this admission.   3. HLD: Continue statin, Last Lipid panel 1/17 shows LDL 81, HDL 31  4. COPD: Stable  5. Tobacco Abuse: reports quitting last June.  Tabitha CoffinSigned, Lindsay Roberts, NP-C Pager [phone] 04/16/2016, 1:41 PM   As above, patient seen and examined. Briefly she is a 50 year old female with past medical history of obesity, diabetes mellitus, hypertension, hyperlipidemia, COPD for evaluation of chest pain. Patient notes some increased dyspnea on exertion. She does not have orthopnea or PND. Chronic mild pedal edema. She developed chest pain 2 days ago. The pain is substernal and continuous. It increases with movements, cough and palpation. It does not radiate. No associated symptoms. The pain has not completely resolved since beginning. She was admitted and has ruled out. Cardiology is now asked to evaluate. Electrocardiogram shows sinus rhythm, low voltage, nonspecific ST changes. Echocardiogram has been ordered and is pending.  Atypical chest pain-symptoms are extremely atypical. They have been present continuously for 48 hours without completely resolving. Enzymes are negative and electrocardiogram shows no diagnostic ST changes. Pain is reproduced with palpation. Likely musculoskeletal. If echocardiogram unremarkable she can be discharged. Given multitude of risk factors would arrange outpatient nuclear study. If negative would recommend exercise program, weight loss, diet and lifestyle modification. Would treat long-term with enteric-coated aspirin 81 mg daily. Continue statin. We will sign off. Please call with questions.  Olga MillersBrian Crenshaw, MD

## 2016-04-16 NOTE — Progress Notes (Signed)
1900 discharge instrtiuons given . Verbalized understandingWheeled to lobby by nt

## 2016-04-16 NOTE — Discharge Summary (Signed)
Physician Discharge Summary  Tabitha Scott ZOX:[id number] DOB: 1966-06-14 DOA: 04/15/2016  PCP: [name], NP  Admit date: 04/15/2016 Discharge date: 04/16/2016  Time spent: 35 minutes  Recommendations for Outpatient Follow-up:  1. Repeat BMEt to follow electrolytes and renal function 2. Please reassess BP and adjust medications if needed    Discharge Diagnoses:  Principal Problem:   Pain in the chest Active Problems:   Hypothyroidism   Pure hypercholesterolemia   Diabetes mellitus type 2, noninsulin dependent (HCC)   Anemia   Esophageal reflux   Pulmonary emphysema (HCC) obesity: BMI > 40  Discharge Condition: stable and improved. Discharge home with instructions to see PCP in 2 weeks. Outpatient nuclear stress test to be scheduled by cardiology service.  Diet recommendation: low calorie, heart healthy and low carbohydrates diet   Filed Weights   04/15/16 1830 04/16/16 0026 04/16/16 0659  Weight: 129.3 kg (285 lb) (!) 142.5 kg (314 lb 3.2 oz) (!) 141.6 kg (312 lb 1.6 oz)    History of present illness:  As per H&P written by Dr,. Tabitha Scott on 8/23 50 y.o. female with medical history significant of osteoarthritis, COPD, type 2 diabetes, hyperlipidemia, neuropathy, hypothyroidism comes emergency department with chest pain since early afternoon to stay 04/14/2016.  Per patient, early afternoon yesterday, while she was holding her grand baby, she developed acute sharp, left-sided chest pain, nonradiating, worsened by movement and exertion associated with dyspnea and mild dizziness, but denies nausea, emesis, palpitations, diaphoresis. She denies recent pitting edema of the lower extremities, PND or orthopnea. She has a history of diabetes, obesity and hyperlipidemia. She is a former smoker since age 58 until June last year. Her father had a history of heart disease.  Hospital Course:  1-atypical CP: non cardiac -heart score 5 (due to risk factors) -r/o with neg troponin and no  acute changes on EKG or telemetry -2-D echo also reassuring  -after discussing with cardiology service; is for nuclear stress test as an outpatient  -advise to follow low sodium diet and to lose weight -discharge on aspirin daily (81mg ) and advise to continue statins  2-diabetes type 2: -continue home hypoglycemic regimen   3-obesity: -Body mass index is 52.74 kg/m. -low calorie diet and exercise discussed with patient  4-GERD -PPI increased to BID for better symptoms control -also advise to minimize use of NSAID's  5-diabetic neuropathy: -will continue neurontin   6-HLD: -continue statins  7-hypothyroidism: -continue synthroid   8-HTN: -continue spironolactone   9-emphysema: -continue PRN albuterol -no wheezing and good O2 sat on RA  Procedures:  See below for x-ray reports   2-D echo: - Left ventricle: The cavity size was normal. Wall thickness was   increased in a pattern of mild LVH. Systolic function was normal.   The estimated ejection fraction was in the range of 55% to 60%.   Features are consistent with a pseudonormal left ventricular   filling pattern, with concomitant abnormal relaxation and   increased filling pressure (grade 2 diastolic dysfunction). - Pericardium, extracardiac: A trivial pericardial effusion was   identified.   Consultations:  Cardiology   Discharge Exam: Vitals:   04/16/16 0807 04/16/16 1206  BP: 133/86 126/71  Pulse: 79 80  Resp: 18 18  Temp: 98 F (36.7 C) 98.5 F (36.9 C)    General: obese, afebrile, in no distress and currently no complaining of CP. Patient denies palpitations and SOB. Cardiovascular: S1 and S2, no rubs, no gallops, no JVD appreciated Respiratory: CTA bilaterally Abd: soft, NT,  ND, positive BS Extremities: no clubbing, no cyanosis   Discharge Instructions   Discharge Instructions    Diet - low sodium heart healthy    Complete by:  As directed   Discharge instructions    Complete by:  As  directed   Keep yourself well hydrated Take medications as prescribed Minimize the use of NSAID's Follow with cardiology service as instructed for outpatient nuclear stress test Daily enteric baby aspirin Follow low calorie diet and heart healthy diet (low sodium; less than 2.5 gram daily)   Increase activity slowly    Complete by:  As directed     Current Discharge Medication List    START taking these medications   Details  aspirin EC 81 MG tablet Take 1 tablet (81 mg total) by mouth daily. Qty: 30 tablet, Refills: 1      CONTINUE these medications which have CHANGED   Details  naproxen (NAPROSYN) 500 MG tablet Take 1 tablet (500 mg total) by mouth every 12 (twelve) hours as needed for moderate pain.    omeprazole (PRILOSEC) 40 MG capsule Take 1 capsule (40 mg total) by mouth 2 (two) times daily. Qty: 60 capsule, Refills: 1      CONTINUE these medications which have NOT CHANGED   Details  albuterol (PROVENTIL HFA;VENTOLIN HFA) 108 (90 Base) MCG/ACT inhaler Inhale 1-2 puffs into the lungs every 6 (six) hours as needed for wheezing or shortness of breath. Qty: 1 Inhaler, Refills: 0    atorvastatin (LIPITOR) 40 MG tablet Take 1 tablet (40 mg total) by mouth daily. Qty: 90 tablet, Refills: 2    diazepam (VALIUM) 5 MG tablet Take 1 tablet (5 mg total) by mouth 2 (two) times daily. Qty: 10 tablet, Refills: 0    ferrous sulfate 325 (65 FE) MG tablet Take 1 tablet (325 mg total) by mouth daily with breakfast. Qty: 30 tablet, Refills: 3   Associated Diagnoses: Anemia, unspecified anemia type    gabapentin (NEURONTIN) 300 MG capsule Take 1 capsule (300 mg total) by mouth 3 (three) times daily. Qty: 180 capsule, Refills: 3   Associated Diagnoses: Neuropathy (HCC)    HYDROcodone-acetaminophen (NORCO/VICODIN) 5-325 MG tablet Take 1-2 tablets by mouth every 6 (six) hours as needed. Qty: 10 tablet, Refills: 0    levothyroxine (SYNTHROID, LEVOTHROID) 25 MCG tablet Take 1 tablet (25  mcg total) by mouth daily before breakfast. Qty: 90 tablet, Refills: 2    linagliptin (TRADJENTA) 5 MG TABS tablet Take 1 tablet (5 mg total) by mouth daily. Qty: 90 tablet, Refills: 3    Liraglutide (VICTOZA) 18 MG/3ML SOPN Inject 0.3 mLs (1.8 mg total) into the skin daily. Qty: 6 mL, Refills: prn   Associated Diagnoses: Diabetes mellitus of other type with complication (HCC)    tiZANidine (ZANAFLEX) 4 MG capsule Take 1 capsule (4 mg total) by mouth 3 (three) times daily as needed for muscle spasms. Qty: 10 capsule, Refills: 0    Insulin Pen Needle (B-D UF III MINI PEN NEEDLES) 31G X 5 MM MISC 1 each by Does not apply route daily. Qty: 100 each, Refills: 11    spironolactone (ALDACTONE) 25 MG tablet Take 0.5 tablets (12.5 mg total) by mouth daily. Qty: 30 tablet, Refills: 0      STOP taking these medications     ibuprofen (ADVIL,MOTRIN) 600 MG tablet      predniSONE (DELTASONE) 20 MG tablet        Allergies  Allergen Reactions  . Tetanus Toxoids Swelling  Follow-up Information    [name], NP. Schedule an appointment as soon as possible for a visit in 2 week(s).   Specialty:  Family Medicine Contact information: [address] [place] [address] [phone]           The results of significant diagnostics from this hospitalization (including imaging, microbiology, ancillary and laboratory) are listed below for reference.    Significant Diagnostic Studies: Dg Chest 2 View  Result Date: 04/15/2016 CLINICAL DATA:  Central chest pain since yesterday. Shortness of breath. EXAM: CHEST  2 VIEW COMPARISON:  11/25/2015 and 10/18/2015 radiographs. FINDINGS: The heart size and mediastinal contours are stable. There is stable chronic interstitial prominence and central airway thickening. No edema, confluent airspace opacity or pleural effusion. The bones appear unchanged. IMPRESSION: Stable chest with chronic interstitial prominence and central airway  thickening. No acute findings demonstrated. Electronically Signed   By: Carey Bullocks M.D.   On: 04/15/2016 15:49    Microbiology: Recent Results (from the past 240 hour(s))  MRSA PCR Screening     Status: Abnormal   Collection Time: 04/16/16  8:21 AM  Result Value Ref Range Status   MRSA by PCR POSITIVE (A) NEGATIVE Final    Comment:        The GeneXpert MRSA Assay (FDA approved for NASAL specimens only), is one component of a comprehensive MRSA colonization surveillance program. It is not intended to diagnose MRSA infection nor to guide or monitor treatment for MRSA infections. RESULT CALLED TO, READ BACK BY AND VERIFIED WITH: MAGDIPAN RN 9:35 04/16/16  ([email or web address])      Labs: Basic Metabolic Panel:  Recent Labs Lab 04/15/16 1530 04/16/16 0040 04/16/16 0829  NA 139  --  138  K 3.5  --  3.9  CL 104  --  107  CO2 27  --  22  GLUCOSE 96  --  108*  BUN 9  --  10  CREATININE 1.24* 1.24* 1.16*  CALCIUM 8.8*  --  8.5*   Liver Function Tests:  Recent Labs Lab 04/16/16 0829  AST 41  ALT 38  ALKPHOS 79  BILITOT 0.8  PROT 7.5  ALBUMIN 3.3*   CBC:  Recent Labs Lab 04/15/16 1530 04/16/16 0040 04/16/16 0829  WBC 8.3 7.4 7.7  HGB 10.8* 10.0* 10.6*  HCT 36.2 34.4* 35.0*  MCV 85.2 86.4 84.7  PLT 230 186 180   Cardiac Enzymes:  Recent Labs Lab 04/16/16 0040 04/16/16 0829 04/16/16 1238  TROPONINI <0.03 <0.03 <0.03   CBG:  Recent Labs Lab 04/16/16 0012 04/16/16 0742 04/16/16 1204 04/16/16 1641  GLUCAP 113* 105* 109* 108*    Signed:  Vassie Loll MD.  [facility] 04/16/2016, 6:14 PM

## 2016-04-17 LAB — HEMOGLOBIN A1C
Hgb A1c MFr Bld: 5.6 % (ref 4.8–5.6)
Mean Plasma Glucose: 114 mg/dL

## 2016-04-29 ENCOUNTER — Other Ambulatory Visit: Payer: Self-pay | Admitting: Family Medicine

## 2016-04-29 DIAGNOSIS — G629 Polyneuropathy, unspecified: Secondary | ICD-10-CM

## 2016-06-15 ENCOUNTER — Ambulatory Visit: Payer: Medicaid Other | Admitting: Family Medicine

## 2016-07-23 ENCOUNTER — Ambulatory Visit (INDEPENDENT_AMBULATORY_CARE_PROVIDER_SITE_OTHER): Payer: Medicaid Other | Admitting: Family Medicine

## 2016-07-23 ENCOUNTER — Encounter: Payer: Self-pay | Admitting: Family Medicine

## 2016-07-23 VITALS — BP 132/73 | HR 90 | Temp 98.5°F | Resp 18 | Ht 64.5 in | Wt 328.0 lb

## 2016-07-23 DIAGNOSIS — J449 Chronic obstructive pulmonary disease, unspecified: Secondary | ICD-10-CM

## 2016-07-23 DIAGNOSIS — G8929 Other chronic pain: Secondary | ICD-10-CM

## 2016-07-23 DIAGNOSIS — E876 Hypokalemia: Secondary | ICD-10-CM | POA: Diagnosis not present

## 2016-07-23 DIAGNOSIS — G629 Polyneuropathy, unspecified: Secondary | ICD-10-CM

## 2016-07-23 DIAGNOSIS — Z1239 Encounter for other screening for malignant neoplasm of breast: Secondary | ICD-10-CM

## 2016-07-23 DIAGNOSIS — Z1231 Encounter for screening mammogram for malignant neoplasm of breast: Secondary | ICD-10-CM

## 2016-07-23 DIAGNOSIS — R87629 Unspecified abnormal cytological findings in specimens from vagina: Secondary | ICD-10-CM | POA: Diagnosis not present

## 2016-07-23 DIAGNOSIS — E118 Type 2 diabetes mellitus with unspecified complications: Secondary | ICD-10-CM

## 2016-07-23 LAB — COMPLETE METABOLIC PANEL WITH GFR
ALBUMIN: 4.1 g/dL (ref 3.6–5.1)
ALK PHOS: 88 U/L (ref 33–130)
ALT: 22 U/L (ref 6–29)
AST: 27 U/L (ref 10–35)
BUN: 8 mg/dL (ref 7–25)
CALCIUM: 8.9 mg/dL (ref 8.6–10.4)
CO2: 30 mmol/L (ref 20–31)
CREATININE: 1.11 mg/dL — AB (ref 0.50–1.05)
Chloride: 99 mmol/L (ref 98–110)
GFR, EST AFRICAN AMERICAN: 66 mL/min (ref >=60)
GFR, Est Non African American: 57 mL/min — ABNORMAL LOW (ref >=60)
Glucose, Bld: 81 mg/dL (ref 65–99)
POTASSIUM: 4.3 mmol/L (ref 3.5–5.3)
Sodium: 139 mmol/L (ref 135–146)
Total Bilirubin: 0.6 mg/dL (ref 0.2–1.2)
Total Protein: 8 g/dL (ref 6.1–8.1)

## 2016-07-23 MED ORDER — GABAPENTIN 300 MG PO CAPS: 300.0000 mg | ORAL_CAPSULE | Freq: Two times a day (BID) | ORAL | 2 refills | Status: DC

## 2016-07-23 MED ORDER — SPIRONOLACTONE 25 MG PO TABS: 12.5000 mg | ORAL_TABLET | Freq: Every day | ORAL | 0 refills | Status: DC

## 2016-07-23 MED ORDER — LEVOTHYROXINE SODIUM 25 MCG PO TABS: 25.0000 ug | ORAL_TABLET | Freq: Every day | ORAL | 2 refills | Status: DC

## 2016-07-23 MED ORDER — ALBUTEROL SULFATE HFA 108 (90 BASE) MCG/ACT IN AERS: 1.0000 | INHALATION_SPRAY | Freq: Four times a day (QID) | RESPIRATORY_TRACT | 0 refills | Status: DC | PRN

## 2016-07-23 MED ORDER — INSULIN PEN NEEDLE 31G X 5 MM MISC: 1.0000 | Freq: Every day | 11 refills | Status: DC

## 2016-07-23 MED ORDER — ATORVASTATIN CALCIUM 40 MG PO TABS: 40.0000 mg | ORAL_TABLET | Freq: Every day | ORAL | 2 refills | Status: DC

## 2016-07-23 MED ORDER — FERROUS SULFATE 325 (65 FE) MG PO TABS: 325.0000 mg | ORAL_TABLET | Freq: Every day | ORAL | 3 refills | Status: DC

## 2016-07-23 MED ORDER — OMEPRAZOLE 40 MG PO CPDR: 40.0000 mg | DELAYED_RELEASE_CAPSULE | Freq: Two times a day (BID) | ORAL | 1 refills | Status: DC

## 2016-07-23 MED ORDER — LIRAGLUTIDE 18 MG/3ML ~~LOC~~ SOPN: 1.8000 mg | PEN_INJECTOR | Freq: Every day | SUBCUTANEOUS | 99 refills | Status: DC

## 2016-07-23 NOTE — Progress Notes (Signed)
Tabitha Scott, is a 50 y.o. female  UJW:[id number]:[id number]  FAO:[id number]  DOB - [birth year]  CC:  Chief Complaint  Patient presents with  . Shortness of Breath    x 2 months   . Follow-up  . Medication Refill       HPI: Tabitha Scott is a 50 y.o. female here for follow-up chronic conditions. She has a history of diabetes, hypercholesterolemia, arthritis, chronic pain, COPD, hypothyroidism and diabetic neuropathy.  Her current medications are in her medication list and have been reviewed. She reports no regular exercise and does not follow a good diabetic diet. She reports using her home O2 1-2 times a week. She uses her albuterol daily. She is not followed by pulmonology. She has a history of an abnormal PAP smear and needs referral to GYN. She also asks for referral to pain clinic. She also has OSA  Health Maintenance: She has had flu and pneumonia shots, declines Tdap. She needs mammogram. She reports colonoscopy about 2 years ago.    Allergies  Allergen Reactions  . Tetanus Toxoids Swelling   Past Medical History:  Diagnosis Date  . Arthritis   . Chronic left shoulder pain   . Chronic lower back pain   . COPD (chronic obstructive pulmonary disease) (HCC)   . High cholesterol   . Hyperlipidemia   . Hypothyroidism   . Neuropathy (HCC)   . On home oxygen therapy    "have it available; quit smoking; not using it" (04/16/2016)  . OSA (obstructive sleep apnea)    "have mask; don't use" (04/16/2016)  . Thyroid disease   . Type II diabetes mellitus (HCC)    Current Outpatient Prescriptions on File Prior to Visit  Medication Sig Dispense Refill  . aspirin EC 81 MG tablet Take 1 tablet (81 mg total) by mouth daily. 30 tablet 1  . diazepam (VALIUM) 5 MG tablet Take 1 tablet (5 mg total) by mouth 2 (two) times daily. 10 tablet 0  . HYDROcodone-acetaminophen (NORCO/VICODIN) 5-325 MG tablet Take 1-2 tablets by mouth every 6 (six) hours as needed. 10 tablet 0  . linagliptin (TRADJENTA) 5 MG  TABS tablet Take 1 tablet (5 mg total) by mouth daily. 90 tablet 3  . naproxen (NAPROSYN) 500 MG tablet Take 1 tablet (500 mg total) by mouth every 12 (twelve) hours as needed for moderate pain.    Marland Kitchen. tiZANidine (ZANAFLEX) 4 MG capsule Take 1 capsule (4 mg total) by mouth 3 (three) times daily as needed for muscle spasms. 10 capsule 0   No current facility-administered medications on file prior to visit.    Family History  Problem Relation Age of Onset  . COPD Mother   . Heart disease Father   . Cancer Maternal Grandmother    Social History   Social History  . Marital status: Divorced    Spouse name: N/A  . Number of children: N/A  . Years of education: N/A   Occupational History  . Not on file.   Social History Main Topics  . Smoking status: Former Smoker    Packs/day: 1.50    Years: 41.00    Types: Cigarettes    Quit date: 02/11/2015  . Smokeless tobacco: Never Used  . Alcohol use No  . Drug use: No  . Sexual activity: No   Other Topics Concern  . Not on file   Social History Narrative  . No narrative on file    Review of Systems: Constitutional: Negative Skin: Negative HENT: Negative  Eyes: Negative  Neck: Negative Respiratory: + for shortness of breath Cardiovascular: + swelling of feet and legs.  Gastrointestinal: Negative Genitourinary: Negative  Musculoskeletal: Negative   Neurological: Negative for Hematological: Negative  Psychiatric/Behavioral: Negative    Objective:   Vitals:   07/23/16 1312  BP: 132/73  Pulse: 90  Resp: 18  Temp: 98.5 F (36.9 C)    Physical Exam: Constitutional: Patient appears well-developed and well-nourished. No distress.Obese HENT: Normocephalic, atraumatic, External right and left ear normal. Oropharynx is clear and moist.  Eyes: Conjunctivae and EOM are normal. PERRLA, no scleral icterus. Neck: Normal ROM. Neck supple. No lymphadenopathy, No thyromegaly. CVS: RRR, S1/S2 +, no murmurs, no gallops, no  rubs Pulmonary: Effort and breath sounds normal, no stridor, rhonchi, wheezes, rales.  Abdominal: Soft. Normoactive BS,, no distension, tenderness, rebound or guarding.  Musculoskeletal: Normal range of motion. No edema and no tenderness.  Neuro: Alert.Normal muscle tone coordination. Non-focal Skin: Skin is warm and dry. No rash noted. Not diaphoretic. No erythema. No pallor. Psychiatric: Normal mood and affect. Behavior, judgment, thought content normal.  Lab Results  Component Value Date   WBC 7.7 04/16/2016   HGB 10.6 (L) 04/16/2016   HCT 35.0 (L) 04/16/2016   MCV 84.7 04/16/2016   PLT 180 04/16/2016   Lab Results  Component Value Date   CREATININE 1.16 (H) 04/16/2016   BUN 10 04/16/2016   NA 138 04/16/2016   K 3.9 04/16/2016   CL 107 04/16/2016   CO2 22 04/16/2016    Lab Results  Component Value Date   HGBA1C 5.6 04/16/2016   Lipid Panel     Component Value Date/Time   CHOL 164 09/24/2015 0904   TRIG 260 (H) 09/24/2015 0904   HDL 31 (L) 09/24/2015 0904   CHOLHDL 5.3 (H) 09/24/2015 0904   VLDL 52 (H) 09/24/2015 0904   LDLCALC 81 09/24/2015 0904        Assessment and plan:   1. Hypokalemia  - COMPLETE METABOLIC PANEL WITH GFR  2. Other chronic pain  - Ambulatory referral to Pain Clinic  3. Abnormal vaginal Pap smear  - Ambulatory referral to Gynecology  4. Chronic obstructive pulmonary disease, unspecified COPD type (HCC)  - Ambulatory referral to Pulmonology  5. Screening breast examination  - MM DIGITAL SCREENING BILATERAL; Future  6. Type 2 diabetes mellitus with complication, unspecified long term insulin use status (HCC)  - Microalbumin, urine - Ambulatory referral to Ophthalmology  7. Neuropathy (HCC)  - gabapentin (NEURONTIN) 300 MG capsule; Take 1 capsule (300 mg total) by mouth 2 (two) times daily.  Dispense: 180 capsule; Refill: 2   Return in about 3 months (around 10/21/2016).  The patient was given clear instructions to go to  ER or return to medical center if symptoms don't improve, worsen or new problems develop. The patient verbalized understanding.    Henrietta HooverLinda C Zyanne Schumm FNP  07/23/2016, 2:11 PM

## 2016-07-24 ENCOUNTER — Other Ambulatory Visit: Payer: Self-pay | Admitting: Family Medicine

## 2016-07-24 DIAGNOSIS — Z1231 Encounter for screening mammogram for malignant neoplasm of breast: Secondary | ICD-10-CM

## 2016-07-24 LAB — MICROALBUMIN, URINE: MICROALB UR: 0.6 mg/dL

## 2016-08-18 ENCOUNTER — Institutional Professional Consult (permissible substitution): Payer: Medicaid Other | Admitting: Internal Medicine

## 2016-08-19 ENCOUNTER — Ambulatory Visit: Payer: Medicaid Other

## 2016-09-04 ENCOUNTER — Ambulatory Visit
Admission: RE | Admit: 2016-09-04 | Discharge: 2016-09-04 | Disposition: A | Discharge status: Home / Self Care | Payer: Medicaid Other | Source: Ambulatory Visit | Attending: Family Medicine | Admitting: Family Medicine

## 2016-09-04 DIAGNOSIS — Z1231 Encounter for screening mammogram for malignant neoplasm of breast: Secondary | ICD-10-CM

## 2016-09-14 ENCOUNTER — Encounter: Payer: Self-pay | Admitting: Internal Medicine

## 2016-09-14 ENCOUNTER — Ambulatory Visit (INDEPENDENT_AMBULATORY_CARE_PROVIDER_SITE_OTHER): Payer: Medicaid Other | Admitting: Internal Medicine

## 2016-09-14 VITALS — BP 120/84 | HR 92 | Ht 64.5 in | Wt 335.0 lb

## 2016-09-14 DIAGNOSIS — J449 Chronic obstructive pulmonary disease, unspecified: Secondary | ICD-10-CM | POA: Diagnosis not present

## 2016-09-14 NOTE — Patient Instructions (Addendum)
Weight control is simply a matter of calorie balance which needs to be tilted in your favor by eating less and exercising more.  To get the most out of exercise, you need to be continuously aware that you are short of breath, but never out of breath, for 30 minutes daily on oxygen. As you improve, it will actually be easier for you to do the same amount of exercise  in  30 minutes so always push to the level where you are short of breath.  If this does not result in gradual weight reduction then I strongly recommend you see a nutritionist with a food diary x 2 weeks so that we can work out a negative calorie balance which is universally effective in steady weight loss programs.  Think of your calorie balance like you do your bank account where in this case you want the balance to go down so you must take in less calories than you burn up.  It's just that simple:  Hard to do, but easy to understand.  Good luck!   Only use your albuterol as a rescue medication to be used if you can't catch your breath by resting or doing a relaxed purse lip breathing pattern.  - The less you use it, the better it will work when you need it. - Ok to use up to 2 puffs  every 4 hours if you must but call for immediate appointment if use goes up over your usual need - Don't leave home without it !!  (think of it like the spare tire for your car)   Please schedule a follow up visit in 3 months but call sooner if needed with pfts  And your inhaler in hand.

## 2016-09-14 NOTE — Progress Notes (Signed)
Subjective:     Patient ID: Tabitha Scott, female   DOB: 1966/02/06,     MRN: [id number]  HPI  [age] yowf quit smoking in 2016 with some mild doe that  improved while on 02 and albuterol then worse since Nov 2017 so referred to pulmonary clinic 09/14/2016 by  [facility]   09/14/2016 1st [facility] office visit/ Tabitha Scott   Chief Complaint  Patient presents with  . Pulmonary Consult    Self referral. Pt c/o SOB since June 2016, worse over the past few months. She gets SOB with exertion such as cleaning her house or walking from room to room at home. She also feels SOB when she lies down.  She has occ cough that is non prod.  She has occ swelling in her legs. She is taking proair 3 x daily on average.   sleeps on either side down but not back x 2 years due to breathing difficulty supine or even propped up 30 degrees  Wt was 270 when quit smoking  Has 02 but only time she uses after uses it is after exertion. Can't tell much better p saba and none on day of ov  No obvious day to day or daytime variability or assoc excess/ purulent sputum or mucus plugs or hemoptysis or cp or chest tightness, subjective wheeze or overt sinus or hb symptoms. No unusual exp hx or h/o childhood pna/ asthma or knowledge of premature birth.  Sleeping ok without nocturnal  or early am exacerbation  of respiratory  c/o's or need for noct saba. Also denies any obvious fluctuation of symptoms with weather or environmental changes or other aggravating or alleviating factors except as outlined above   Current Medications, Allergies, Complete Past Medical History, Past Surgical History, Family History, and Social History were reviewed in [facility] electronic medical record.  ROS  The following are not active complaints unless bolded sore throat, dysphagia, dental problems, itching, sneezing,  nasal congestion or excess/ purulent secretions, ear ache,   fever, chills, sweats, unintended wt loss,  classically pleuritic or exertional cp,  orthopnea pnd or leg swelling only if on them all day presyncope, palpitations, abdominal pain, anorexia, nausea, vomiting, diarrhea  or change in bowel or bladder habits, change in stools or urine, dysuria,hematuria,  rash, arthralgias, visual complaints, headache, numbness, weakness or ataxia or problems with walking or coordination,  change in mood/affect or memory.           Review of Systems     Objective:   Physical Exam      amb obese wf nad  Wt Readings from Last 3 Encounters:  09/14/16 (!) 335 lb (152 kg)  07/23/16 (!) 328 lb (148.8 kg)  04/16/16 (!) 312 lb 1.6 oz (141.6 kg)    Vital signs reviewed  - Note on arrival 02 sats  95% on RA      HEENT: nl dentition, turbinates, and oropharynx. Nl external ear canals without cough reflex   NECK :  without JVD/Nodes/TM/ nl carotid upstrokes bilaterally   LUNGS: no acc muscle use,  Nl contour chest which is clear to A and P bilaterally without cough on insp or exp maneuvers   CV:  RRR  no s3 or murmur or increase in P2, nad no edema   ABD:  soft and nontender with nl inspiratory excursion in the supine position. No bruits or organomegaly appreciated, bowel sounds nl  MS:  Nl gait/ ext warm without deformities,  calf tenderness, cyanosis or clubbing No obvious joint restrictions   SKIN: warm and dry without lesions    NEURO:  alert, approp, nl sensorium with  no motor or cerebellar deficits apparent.     I personally reviewed images and agree with radiology impression as follows:  CXR:  04/15/16 Stable chest with chronic interstitial prominence and central airway thickening. No acute findings demonstrated.    Labs reviewed:     Chemistry      Component Value Date/Time   NA 139 07/23/2016 1348   K 4.3 07/23/2016 1348   CL 99 07/23/2016 1348   CO2 30 07/23/2016 1348   BUN 8 07/23/2016 1348   CREATININE 1.11 (H) 07/23/2016 1348      Component Value Date/Time    CALCIUM 8.9 07/23/2016 1348   ALKPHOS 88 07/23/2016 1348   AST 27 07/23/2016 1348   ALT 22 07/23/2016 1348   BILITOT 0.6 07/23/2016 1348        Lab Results  Component Value Date   WBC 7.7 04/16/2016   HGB 10.6 (L) 04/16/2016   HCT 35.0 (L) 04/16/2016   MCV 84.7 04/16/2016   PLT 180 04/16/2016        Lab Results  Component Value Date   TSH 2.540 11/25/2015                Assessment:

## 2016-09-15 NOTE — Assessment & Plan Note (Addendum)
Quit smoking 2016 - Spirometry 09/14/2016  FEV1 1.92 (69%)  Ratio 84 with abn contour on effort dep portion and mild concavity on effort indep portion      - The proper method of use, as well as anticipated side effects, of a metered-dose inhaler are discussed and demonstrated to the patient.    When respiratory symptoms begin or become refractory well after a patient reports complete smoking cessation,  Especially when this wasn't the case while they were smoking, a red flag is raised based on the work of Dr Primitivo GauzeFletcher which states:  if you quit smoking when your best day FEV1 is still well preserved it is highly unlikely you will progress to severe disease.  That is to say, once the smoking stops,  the symptoms should not suddenly erupt or markedly worsen- (her pattern has been progressive since quit but esp x 3 months prior to OV)   If so, the differential diagnosis should include  obesity/deconditioning,  LPR/Reflux/Aspiration syndromes,  occult CHF, or  especially side effect of medications commonly used in this population.    I really don't see evidence of any significant airflow obst on pfts or other reasons she's having doe now other than her wt so rec reconditioning/ walking on 02 (not using the 02 p walking) if she finds it helps her and f/u in 3 months with full pfts/ call sooner if not gaining ground to complete additonal w/u for dode  Total time devoted to counseling  > 50 % of 60 min new pt office visit:  review case with pt/ discussion of options/alternatives/ personally creating written customized instructions  in presence of pt  then going over those specific  Instructions directly with the pt including how to use all of the meds but in particular covering each new medication in detail and the difference between the maintenance/automatic meds and the prns using an action plan format for the latter.  Please see AVS from this visit for a full list of these instructions which I personally  wrote for this pt and  are unique to this visit.

## 2016-09-15 NOTE — Assessment & Plan Note (Signed)
Restrictive changes on spirometry 09/14/2016   Body mass index is 56.61 kg/m.  Lab Results  Component Value Date   TSH 2.540 11/25/2015     Contributing to gerd tendency/ doe/reviewed the need and the process to achieve and maintain neg calorie balance > defer f/u primary care including intermittently monitoring thyroid status

## 2016-09-23 ENCOUNTER — Telehealth: Payer: Self-pay

## 2016-09-23 MED ORDER — TIZANIDINE HCL 4 MG PO CAPS: 4.0000 mg | ORAL_CAPSULE | Freq: Three times a day (TID) | ORAL | 0 refills | Status: DC | PRN

## 2016-09-23 MED ORDER — NAPROXEN 500 MG PO TABS: 500.0000 mg | ORAL_TABLET | Freq: Two times a day (BID) | ORAL | 3 refills | Status: DC | PRN

## 2016-09-23 MED ORDER — LINAGLIPTIN 5 MG PO TABS: 5.0000 mg | ORAL_TABLET | Freq: Every day | ORAL | 3 refills | Status: DC

## 2016-09-23 MED ORDER — ATORVASTATIN CALCIUM 40 MG PO TABS: 40.0000 mg | ORAL_TABLET | Freq: Every day | ORAL | 2 refills | Status: DC

## 2016-09-23 MED ORDER — LEVOTHYROXINE SODIUM 25 MCG PO TABS: 25.0000 ug | ORAL_TABLET | Freq: Every day | ORAL | 2 refills | Status: DC

## 2016-09-24 ENCOUNTER — Telehealth (HOSPITAL_COMMUNITY): Payer: Self-pay | Admitting: *Deleted

## 2016-09-24 NOTE — Telephone Encounter (Signed)
Patient given detailed instructions per Myocardial Perfusion Study Information Sheet for the test on 09/29/16. Patient notified to arrive 15 minutes early and that it is imperative to arrive on time for appointment to keep from having the test rescheduled.  If you need to cancel or reschedule your appointment, please call the office within 24 hours of your appointment. Failure to do so may result in a cancellation of your appointment, and a $50 no show fee. Patient verbalized understanding. Tabitha Scott    

## 2016-09-29 ENCOUNTER — Ambulatory Visit (HOSPITAL_COMMUNITY): Payer: Medicaid Other | Attending: Cardiology

## 2016-09-29 DIAGNOSIS — I2510 Atherosclerotic heart disease of native coronary artery without angina pectoris: Secondary | ICD-10-CM | POA: Insufficient documentation

## 2016-09-29 DIAGNOSIS — R079 Chest pain, unspecified: Secondary | ICD-10-CM

## 2016-09-29 MED ORDER — TECHNETIUM TC 99M TETROFOSMIN IV KIT
30.6000 | PACK | Freq: Once | INTRAVENOUS | Status: AC | PRN
  Administered 2016-09-29: 30.6 via INTRAVENOUS
  Filled 2016-09-29: qty 31

## 2016-09-29 MED ORDER — REGADENOSON 0.4 MG/5ML IV SOLN
0.4000 mg | Freq: Once | INTRAVENOUS | Status: AC
  Administered 2016-09-29: 0.4 mg via INTRAVENOUS

## 2016-09-30 ENCOUNTER — Ambulatory Visit (HOSPITAL_COMMUNITY): Payer: Medicaid Other | Attending: Internal Medicine

## 2016-09-30 MED ORDER — TECHNETIUM TC 99M TETROFOSMIN IV KIT
32.9000 | PACK | Freq: Once | INTRAVENOUS | Status: AC | PRN
  Administered 2016-09-30: 32.9 via INTRAVENOUS
  Filled 2016-09-30: qty 33

## 2016-10-02 LAB — MYOCARDIAL PERFUSION IMAGING
CHL CUP NUCLEAR SDS: 5
CHL CUP NUCLEAR SRS: 4
CHL CUP NUCLEAR SSS: 9
CHL CUP RESTING HR STRESS: 86 {beats}/min
CSEPPHR: 98 {beats}/min
LVDIAVOL: 89 mL (ref 46–106)
LVSYSVOL: 30 mL
RATE: 0.42
TID: 0.94

## 2016-11-02 ENCOUNTER — Ambulatory Visit: Payer: Medicaid Other | Admitting: Family Medicine

## 2016-12-14 ENCOUNTER — Other Ambulatory Visit (INDEPENDENT_AMBULATORY_CARE_PROVIDER_SITE_OTHER): Payer: Medicaid Other

## 2016-12-14 ENCOUNTER — Ambulatory Visit (INDEPENDENT_AMBULATORY_CARE_PROVIDER_SITE_OTHER): Payer: Medicaid Other | Admitting: Internal Medicine

## 2016-12-14 ENCOUNTER — Encounter: Payer: Self-pay | Admitting: Internal Medicine

## 2016-12-14 ENCOUNTER — Other Ambulatory Visit: Payer: Self-pay | Admitting: Family Medicine

## 2016-12-14 VITALS — BP 120/80 | HR 93 | Ht 64.0 in | Wt 341.0 lb

## 2016-12-14 DIAGNOSIS — J449 Chronic obstructive pulmonary disease, unspecified: Secondary | ICD-10-CM

## 2016-12-14 DIAGNOSIS — R0609 Other forms of dyspnea: Secondary | ICD-10-CM

## 2016-12-14 DIAGNOSIS — I519 Heart disease, unspecified: Secondary | ICD-10-CM

## 2016-12-14 DIAGNOSIS — I5189 Other ill-defined heart diseases: Secondary | ICD-10-CM

## 2016-12-14 LAB — CBC WITH DIFFERENTIAL/PLATELET
BASOS ABS: 0.1 10*3/uL (ref 0.0–0.1)
BASOS PCT: 0.6 % (ref 0.0–3.0)
Eosinophils Absolute: 0.2 10*3/uL (ref 0.0–0.7)
Eosinophils Relative: 2.1 % (ref 0.0–5.0)
HCT: 37.5 % (ref 36.0–46.0)
HEMOGLOBIN: 12 g/dL (ref 12.0–15.0)
Lymphocytes Relative: 28.4 % (ref 12.0–46.0)
Lymphs Abs: 2.8 10*3/uL (ref 0.7–4.0)
MCHC: 32.1 g/dL (ref 30.0–36.0)
MCV: 86 fl (ref 78.0–100.0)
Monocytes Absolute: 0.6 10*3/uL (ref 0.1–1.0)
Monocytes Relative: 6.1 % (ref 3.0–12.0)
Neutro Abs: 6.2 10*3/uL (ref 1.4–7.7)
Neutrophils Relative %: 62.8 % (ref 43.0–77.0)
Platelets: 225 10*3/uL (ref 150.0–400.0)
RBC: 4.36 Mil/uL (ref 3.87–5.11)
RDW: 17.3 % — AB (ref 11.5–15.5)
WBC: 9.9 10*3/uL (ref 4.0–10.5)

## 2016-12-14 LAB — BASIC METABOLIC PANEL
BUN: 14 mg/dL (ref 6–23)
CALCIUM: 9.7 mg/dL (ref 8.4–10.5)
CO2: 34 meq/L — AB (ref 19–32)
CREATININE: 1.14 mg/dL (ref 0.40–1.20)
Chloride: 98 mEq/L (ref 96–112)
GFR: 53.02 mL/min — AB (ref >60.00)
Glucose, Bld: 91 mg/dL (ref 70–99)
Potassium: 4.2 mEq/L (ref 3.5–5.1)
Sodium: 139 mEq/L (ref 135–145)

## 2016-12-14 LAB — TSH: TSH: 6.94 u[IU]/mL — AB (ref 0.35–4.50)

## 2016-12-14 LAB — BRAIN NATRIURETIC PEPTIDE: PRO B NATRI PEPTIDE: 50 pg/mL (ref 0.0–100.0)

## 2016-12-14 MED ORDER — SPIRONOLACTONE-HCTZ 25-25 MG PO TABS: 1.0000 | ORAL_TABLET | Freq: Every day | ORAL | 11 refills | Status: DC

## 2016-12-14 NOTE — Assessment & Plan Note (Addendum)
12/14/2016   Walked RA x one lap @ 185 stopped due to  Sob / nl pace/ sats still 90% at end of study  - Spirometry 12/14/2016  Restrictive changes only s saba prior   Symptoms are markedly disproportionate to objective findings and not clear this is a lung problem but pt does appear to have difficult airway management issues. DDX of  difficult airways management almost all start with A and  include Adherence, Ace Inhibitors, Acid Reflux, Active Sinus Disease, Alpha 1 Antitripsin deficiency, Anxiety masquerading as Airways dz,  ABPA,  Allergy(esp in young), Aspiration (esp in elderly), Adverse effects of meds,  Active smokers, A bunch of PE's (a small clot burden can't cause this syndrome unless there is already severe underlying pulm or vascular dz with poor reserve) plus two Bs  = Bronchiectasis and Beta blocker use..and one C= CHF   Adherence is always the initial "prime suspect" and is a multilayered concern that requires a "trust but verify" approach in every patient - starting with knowing how to use medications, especially inhalers, correctly, keeping up with refills and understanding the fundamental difference between maintenance and prns vs those medications only taken for a very short course and then stopped and not refilled.  - needs to return with all meds in hand using a trust but verify approach to confirm accurate Medication  Reconciliation The principal here is that until we are certain that the  patients are doing what we've asked, it makes no sense to ask them to do more.   ? Acid (or non-acid) GERD > always difficult to exclude as up to 75% of pts in some series report no assoc GI/ Heartburn symptoms> rec continue max (24h)  acid suppression and diet restrictions/ reviewed     ? Anxiety/depression/ deconditioning  > usually at the bottom of this list of usual suspects but should be much higher on this pt's based on H and P    ? A bunch of PE's and clearly at risk but nothing to suggest  this in hx.   ? chf > bnp reassuring though fluid retention suggests component diastolic dysfunction or venous insuf both related to wt gain > rec add aldatazide 25-25 one daily and f/u in  2 weeks here    I had an extended discussion with the patient reviewing all relevant studies completed to date and  lasting 25 minutes of a 40  minute f/u office visit addressing  severe non-specific but potentially very serious refractory respiratory symptoms of uncertain and potentially multiple  etiologies.   Each maintenance medication was reviewed in detail including most importantly the difference between maintenance and prns and under what circumstances the prns are to be triggered using an action plan format that is not reflected in the computer generated alphabetically organized AVS.    Please see AVS for specific instructions unique to this office visit that I personally wrote and verbalized to the the pt in detail and then reviewed with pt  by my nurse highlighting any changes in therapy/plan of care  recommended at today's visit.

## 2016-12-14 NOTE — Progress Notes (Signed)
Subjective:     Patient ID: Tabitha Scott, female   DOB: [birth year]      MRN: [id number]    Brief patient profile:  50 yowf quit smoking in 2016 with some mild doe that  improved while on 02 and albuterol then worse since Nov 2017 so referred to pulmonary clinic 09/14/2016 by  [facility] with documented GOLD 0 on initial ov    History of Present Illness  09/14/2016 1st [facility] office visit/ [facility]   Chief Complaint  Patient presents with  . Pulmonary Consult    Self referral. Pt c/o SOB since June 2016, worse over the past few months. She gets SOB with exertion such as cleaning her house or walking from room to room at home. She also feels SOB when she lies down.  She has occ cough that is non prod.  She has occ swelling in her legs. She is taking proair 3 x daily on average.   sleeps on either side down but not back x 2 years due to breathing difficulty supine or even propped up 30 degrees  Wt was 270 when quit smoking  Has 02 but only time she uses after uses it is after exertion. Can't tell much better p saba and none on day of ov Rec Weight control is simply a matter of calorie balance  Only use your albuterol as a rescue medication  Please schedule a follow up visit in 3 months but call sooner if needed with pfts  And your inhaler in hand.      12/14/2016  f/u ov/[facility] re:  MO/ restrictive lung dz   Chief Complaint  Patient presents with  . Follow-up    Pt states breathing has been worse for the past 6 wks.  She states she is using albuterol inhaler 2 x daily on average.    Walking daily with daughter x 5 min flat slow pace  Sleeps on  Pillows on CPAP per Dr Colette Ribas  Swelling worse than usual assoc with worse doe indolent onset, minimally progressive/ has not tried saba / no assoc cough  No longer has 02 as did not qualify    No obvious day to day or daytime variability or assoc excess/ purulent sputum or mucus plugs or hemoptysis or cp or chest  tightness, subjective wheeze or overt sinus or hb symptoms. No unusual exp hx or h/o childhood pna/ asthma or knowledge of premature birth.  Sleeping ok without nocturnal  or early am exacerbation  of respiratory  c/o's or need for noct saba. Also denies any obvious fluctuation of symptoms with weather or environmental changes or other aggravating or alleviating factors except as outlined above   Current Medications, Allergies, Complete Past Medical History, Past Surgical History, Family History, and Social History were reviewed in [facility] record.  ROS  The following are not active complaints unless bolded sore throat, dysphagia, dental problems, itching, sneezing,  nasal congestion or excess/ purulent secretions, ear ache,   fever, chills, sweats, unintended wt loss, classically pleuritic or exertional cp,  orthopnea pnd or leg swelling, presyncope, palpitations, abdominal pain, anorexia, nausea, vomiting, diarrhea  or change in bowel or bladder habits, change in stools or urine, dysuria,hematuria,  rash, arthralgias, visual complaints, headache, numbness, weakness or ataxia or problems with walking or coordination,  change in mood/affect or memory.              Objective:  Physical Exam  amb obese very hoarse  wf nad prominent pseudowheezes   12/14/2016         341        09/14/16 (!) 335 lb (152 kg)  07/23/16 (!) 328 lb (148.8 kg)  04/16/16 (!) 312 lb 1.6 oz (141.6 kg)    Vital signs reviewed  - Note on arrival 02 sats  95% on RA      HEENT: nl dentition, turbinates, and oropharynx. Nl external ear canals without cough reflex   NECK :  without JVD/Nodes/TM/ nl carotid upstrokes bilaterally   LUNGS: no acc muscle use,  Nl contour chest which is clear to A and P bilaterally without cough on insp or exp maneuvers   CV:  RRR  no s3 or murmur or increase in P2, nad  1-2+ pitting bilateral lower ext edema   ABD:  soft and nontender with nl  inspiratory excursion in the supine position. No bruits or organomegaly appreciated, bowel sounds nl  MS:  Nl gait/ ext warm without deformities, calf tenderness, cyanosis or clubbing No obvious joint restrictions   SKIN: warm and dry without lesions    NEURO:  alert, approp, nl sensorium with  no motor or cerebellar deficits apparent.        CXR PA and Lateral:   12/14/2016 :    I personally reviewed images and agree with radiology impression as follows:    ordered/ did not go   Labs ordered/ reviewed:     Chemistry      Component Value Date/Time   NA 139 12/14/2016 1437   K 4.2 12/14/2016 1437   CL 98 12/14/2016 1437   CO2 34 (H) 12/14/2016 1437   BUN 14 12/14/2016 1437   CREATININE 1.14 12/14/2016 1437   CREATININE 1.11 (H) 07/23/2016 1348      Component Value Date/Time   CALCIUM 9.7 12/14/2016 1437   ALKPHOS 88 07/23/2016 1348   AST 27 07/23/2016 1348   ALT 22 07/23/2016 1348   BILITOT 0.6 07/23/2016 1348        Lab Results  Component Value Date   WBC 9.9 12/14/2016   HGB 12.0 12/14/2016   HCT 37.5 12/14/2016   MCV 86.0 12/14/2016   PLT 225.0 12/14/2016         Lab Results  Component Value Date   TSH 6.94 (H) 12/14/2016     Lab Results  Component Value Date   PROBNP 50.0 12/14/2016              Assessment:

## 2016-12-14 NOTE — Patient Instructions (Addendum)
Aldactazide 25/25 each am should prevent the swelling from developing dung the day  Prilosec should be Take 30- 60 min before your first and last meals of the day    GERD (REFLUX)  is an extremely common cause of respiratory symptoms just like yours , many times with no obvious heartburn at all.    It can be treated with medication, but also with lifestyle changes including elevation of the head of your bed (ideally with 6 inch  bed blocks),  Smoking cessation, avoidance of late meals, excessive alcohol, and avoid fatty foods, chocolate, peppermint, colas, red wine, and acidic juices such as orange juice.  NO MINT OR MENTHOL PRODUCTS SO NO COUGH DROPS  USE SUGARLESS CANDY INSTEAD (Jolley ranchers or Stover's or Life Savers) or even ice chips will also do - the key is to swallow to prevent all throat clearing. NO OIL BASED VITAMINS - use powdered substitutes.    Please remember to go to the lab and x-ray department downstairs in the basement  for your tests - we will call you with the results when they are available.     Please schedule a follow up visit in 3 months but call sooner if needed  with all medications /inhalers/ solutions in hand so we can verify exactly what you are taking. This includes all medications from all doctors and over the counters  - PFT's on return

## 2016-12-14 NOTE — Assessment & Plan Note (Signed)
Quit smoking 2016 - Spirometry 09/14/2016  FEV1 1.92 (69%)  Ratio 84 with abn contour on effort dep portion and mild concavity on effort indep portion   - Spirometry 12/14/2016  FEV1 1.59 (58%)  Ratio 74 with min curvature p nothing prior    Again explained she does not have detectable airflow obst and strongly suspect the saba is being used as a placebo that does nothing more than the act of sitting down does to restore her breathing   Would like to get a full set of pfts to tease out the restrictive component > scheduled

## 2016-12-14 NOTE — Assessment & Plan Note (Signed)
Echo 04/16/16 - Left ventricle: The cavity size was normal. Wall thickness was   increased in a pattern of mild LVH. Systolic function was normal.   The estimated ejection fraction was in the range of 55% to 60%.   Features are consistent with a pseudonormal left ventricular   filling pattern, with concomitant abnormal relaxation and   increased filling pressure (grade 2 diastolic dysfunction). - Pericardium, extracardiac: A trivial pericardial effusion was   identified. altdactzide 25/-25 one daily 12/14/2016 >>>

## 2016-12-14 NOTE — Assessment & Plan Note (Addendum)
Restrictive changes on spirometry 09/14/2016   Body mass index is 58.53 kg/m.  -  trending up  Lab Results  Component Value Date   TSH 6.94 (H) 12/14/2016     Contributing to gerd risk/ doe/reviewed the need and the process to achieve and maintain neg calorie balance > defer f/u primary care including intermittently monitoring thyroid status  But for now should double the synthroid dose as only listed as taking 25 mcg per day and check back with PCP in a month

## 2016-12-14 NOTE — Progress Notes (Signed)
lmtcb

## 2016-12-21 ENCOUNTER — Emergency Department (HOSPITAL_COMMUNITY): Payer: Medicaid Other

## 2016-12-21 ENCOUNTER — Encounter (HOSPITAL_COMMUNITY): Payer: Self-pay | Admitting: Emergency Medicine

## 2016-12-21 ENCOUNTER — Emergency Department (HOSPITAL_COMMUNITY)
Admission: EM | Admit: 2016-12-21 | Discharge: 2016-12-21 | Disposition: A | Discharge status: Home / Self Care | Payer: Medicaid Other | Attending: Emergency Medicine | Admitting: Emergency Medicine

## 2016-12-21 DIAGNOSIS — X509XXA Other and unspecified overexertion or strenuous movements or postures, initial encounter: Secondary | ICD-10-CM | POA: Diagnosis not present

## 2016-12-21 DIAGNOSIS — J449 Chronic obstructive pulmonary disease, unspecified: Secondary | ICD-10-CM | POA: Insufficient documentation

## 2016-12-21 DIAGNOSIS — Y9301 Activity, walking, marching and hiking: Secondary | ICD-10-CM | POA: Insufficient documentation

## 2016-12-21 DIAGNOSIS — Z79899 Other long term (current) drug therapy: Secondary | ICD-10-CM | POA: Diagnosis not present

## 2016-12-21 DIAGNOSIS — E119 Type 2 diabetes mellitus without complications: Secondary | ICD-10-CM | POA: Insufficient documentation

## 2016-12-21 DIAGNOSIS — Y929 Unspecified place or not applicable: Secondary | ICD-10-CM | POA: Diagnosis not present

## 2016-12-21 DIAGNOSIS — Z96692 Finger-joint replacement of left hand: Secondary | ICD-10-CM | POA: Diagnosis not present

## 2016-12-21 DIAGNOSIS — E039 Hypothyroidism, unspecified: Secondary | ICD-10-CM | POA: Insufficient documentation

## 2016-12-21 DIAGNOSIS — Z87891 Personal history of nicotine dependence: Secondary | ICD-10-CM | POA: Insufficient documentation

## 2016-12-21 DIAGNOSIS — M25561 Pain in right knee: Secondary | ICD-10-CM | POA: Insufficient documentation

## 2016-12-21 DIAGNOSIS — Z794 Long term (current) use of insulin: Secondary | ICD-10-CM | POA: Diagnosis not present

## 2016-12-21 DIAGNOSIS — Y999 Unspecified external cause status: Secondary | ICD-10-CM | POA: Insufficient documentation

## 2016-12-21 MED ORDER — IBUPROFEN 600 MG PO TABS: 600.0000 mg | ORAL_TABLET | Freq: Four times a day (QID) | ORAL | 0 refills | Status: DC

## 2016-12-21 MED ORDER — HYDROCODONE-ACETAMINOPHEN 5-325 MG PO TABS
1.0000 | ORAL_TABLET | Freq: Once | ORAL | Status: AC
  Administered 2016-12-21: 1 via ORAL
  Filled 2016-12-21: qty 1

## 2016-12-21 MED ORDER — IBUPROFEN 600 MG PO TABS: 600.0000 mg | ORAL_TABLET | Freq: Four times a day (QID) | ORAL | 0 refills | Status: DC | PRN

## 2016-12-21 NOTE — ED Notes (Signed)
See EDP secondary assessment.  

## 2016-12-21 NOTE — ED Notes (Signed)
Ortho in room with patient at this time.  

## 2016-12-21 NOTE — ED Provider Notes (Signed)
[facility]-EMERGENCY DEPT Provider Note   CSN: [id number] Arrival date & time: 12/21/16  2055 By signing my name below, I, Bridgette Habermann, attest that this documentation has been prepared under the direction and in the presence of Felicie Morn, FNP. Electronically Signed: Bridgette Habermann, ED Scribe. 12/21/16. 10:10 PM.  History   Chief Complaint Chief Complaint  Patient presents with  . Knee Pain    HPI The history is provided by the patient. No language interpreter was used.   HPI Comments: Tabitha Scott is a 51 y.o. female with h/o arthritis, COPD, HLD, and DM, who presents to the Emergency Department complaining of gradually worsening right knee pain onset one day ago. Pt denies any recent injury or trauma. She states she was walking yesterday when she heard a pop in her knee. Pain is worsened with bending of the knee and ambulating. No treatments tried PTA. Pt denies fever, chills, or any other associated symptoms.  Past Medical History:  Diagnosis Date  . Arthritis   . Chronic left shoulder pain   . Chronic lower back pain   . COPD (chronic obstructive pulmonary disease) (HCC)   . High cholesterol   . Hyperlipidemia   . Hypothyroidism   . Neuropathy   . On home oxygen therapy    "have it available; quit smoking; not using it" (04/16/2016)  . OSA (obstructive sleep apnea)    "have mask; don't use" (04/16/2016)  . Thyroid disease   . Type II diabetes mellitus [facility])     Patient Active Problem List   Diagnosis Date Noted  . Dyspnea on exertion 12/14/2016  . Diastolic dysfunction 12/14/2016  . COPD GOLD 0  09/14/2016  . Esophageal reflux   . Pulmonary emphysema (HCC)   . Pain in the chest 04/15/2016  . Anemia 04/15/2016  . Hypokalemia 10/18/2015  . ARF (acute renal failure) (HCC) 10/18/2015  . Diabetes mellitus type 2, controlled (HCC) 10/18/2015  . Cough   . Acute respiratory failure with hypoxia (HCC)   . OSA on CPAP   . Morbid (severe) obesity due to excess calories (HCC)   .  Diabetes mellitus type 2, noninsulin dependent (HCC)   . Diabetes (HCC) 04/09/2015  . Hypothyroidism 04/09/2015  . Pedal edema 04/09/2015  . DDD (degenerative disc disease), lumbar 04/09/2015  . Pure hypercholesterolemia 04/09/2015    Past Surgical History:  Procedure Laterality Date  . CARPAL TUNNEL RELEASE Bilateral 2009  . DILATION AND CURETTAGE OF UTERUS    . JOINT REPLACEMENT Left 2015   thumb  . KNEE SURGERY Left 1970s   tissue removed  . LAPAROSCOPIC CHOLECYSTECTOMY  ~ 2014  . NEUROPLASTY / TRANSPOSITION ULNAR NERVE AT ELBOW Bilateral 2009   nerver repair to both arms   . TUBAL LIGATION      OB History    No data available       Home Medications    Prior to Admission medications   Medication Sig Start Date End Date Taking? Authorizing Provider  albuterol (PROVENTIL HFA;VENTOLIN HFA) 108 (90 Base) MCG/ACT inhaler Inhale 1-2 puffs into the lungs every 6 (six) hours as needed for wheezing or shortness of breath. 07/23/16   Henrietta Hoover, NP  atorvastatin (LIPITOR) 40 MG tablet Take 1 tablet (40 mg total) by mouth daily. 09/23/16   Henrietta Hoover, NP  gabapentin (NEURONTIN) 300 MG capsule Take 1 capsule (300 mg total) by mouth 2 (two) times daily. 07/23/16   Henrietta Hoover, NP  Insulin Pen Needle (B-D UF  III MINI PEN NEEDLES) 31G X 5 MM MISC 1 each by Does not apply route daily. 07/23/16   Henrietta Hoover, NP  levothyroxine (SYNTHROID, LEVOTHROID) 25 MCG tablet Take 1 tablet (25 mcg total) by mouth daily before breakfast. 09/23/16   Henrietta Hoover, NP  linagliptin (TRADJENTA) 5 MG TABS tablet Take 1 tablet (5 mg total) by mouth daily. 09/23/16   Henrietta Hoover, NP  liraglutide (VICTOZA) 18 MG/3ML SOPN Inject 0.3 mLs (1.8 mg total) into the skin daily. 07/23/16   Henrietta Hoover, NP  omeprazole (PRILOSEC) 40 MG capsule Take 1 capsule (40 mg total) by mouth 2 (two) times daily. 07/23/16   Henrietta Hoover, NP  spironolactone-hydrochlorothiazide (ALDACTAZIDE)  25-25 MG tablet Take 1 tablet by mouth daily. 12/14/16   Nyoka Cowden, MD  tiZANidine (ZANAFLEX) 4 MG capsule Take 1 capsule (4 mg total) by mouth 3 (three) times daily as needed for muscle spasms. 09/23/16   Henrietta Hoover, NP    Family History Family History  Problem Relation Age of Onset  . COPD Mother   . Heart disease Father   . Cancer Maternal Grandmother     Social History Social History  Substance Use Topics  . Smoking status: Former Smoker    Packs/day: 1.50    Years: 41.00    Types: Cigarettes    Quit date: 02/11/2015  . Smokeless tobacco: Never Used  . Alcohol use No     Allergies   Tetanus toxoids   Review of Systems Review of Systems  Constitutional: Negative for chills and fever.  Musculoskeletal: Positive for arthralgias.  All other systems reviewed and are negative.  Physical Exam Updated Vital Signs BP (!) 131/59 (BP Location: Right Arm)   Pulse 84   Temp 98.4 F (36.9 C) (Oral)   Resp 20   SpO2 95%   Physical Exam  Constitutional: She appears well-developed and well-nourished.  HENT:  Head: Normocephalic.  Eyes: Conjunctivae are normal.  Cardiovascular: Normal rate, regular rhythm and normal heart sounds.  Exam reveals no gallop and no friction rub.   No murmur heard. Pulmonary/Chest: Effort normal and breath sounds normal. No respiratory distress.  Abdominal: She exhibits no distension.  Musculoskeletal: Normal range of motion. She exhibits edema and tenderness.  Lateral right knee tenderness and patellar tenderness. 1+ pitting edema of BLEs (evaluated by PCP for this and started on fluid pills).  Neurological: She is alert.  Skin: Skin is warm and dry.  Psychiatric: She has a normal mood and affect. Her behavior is normal.  Nursing note and vitals reviewed.  ED Treatments / Results  DIAGNOSTIC STUDIES: Oxygen Saturation is 95% on RA, adequate by my interpretation.   COORDINATION OF CARE: 10:10 PM-Discussed next steps with pt. Pt  verbalized understanding and is agreeable with the plan.   Labs (all labs ordered are listed, but only abnormal results are displayed) Labs Reviewed - No data to display  EKG  EKG Interpretation None       Radiology Dg Knee Complete 4 Views Right  Result Date: 12/21/2016 CLINICAL DATA:  Pain and swelling since yesterday were without direct trauma. EXAM: RIGHT KNEE - COMPLETE 4+ VIEW COMPARISON:  None. FINDINGS: No evidence of fracture, dislocation, or joint effusion. No evidence of arthropathy or other focal bone abnormality. Soft tissues are unremarkable. IMPRESSION: Negative. Electronically Signed   By: Ellery Plunk M.D.   On: 12/21/2016 22:34    Procedures Procedures (including critical care time)  Medications Ordered  in ED Medications - No data to display   Initial Impression / Assessment and Plan / ED Course  I have reviewed the triage vital signs and the nursing notes.  Pertinent labs & imaging results that were available during my care of the patient were reviewed by me and considered in my medical decision making (see chart for details).     Patient X-Ray negative for obvious fracture or dislocation.  Pt advised to follow up with orthopedics. Patient given knee immobilizer and crutches while in ED, conservative therapy recommended and discussed. Patient will be discharged home & is agreeable with above plan. Returns precautions discussed. Pt appears safe for discharge.  Final Clinical Impressions(s) / ED Diagnoses   Final diagnoses:  Acute pain of right knee    New Prescriptions Current Discharge Medication List    START taking these medications   Details  ibuprofen (ADVIL,MOTRIN) 600 MG tablet Take 1 tablet (600 mg total) by mouth every 6 (six) hours. Qty: 20 tablet, Refills: 0       I personally performed the services described in this documentation, which was scribed in my presence. The recorded information has been reviewed and is accurate.       Felicie Morn, NP 12/21/16 1610    Mancel Bale, MD 12/24/16 [phone]

## 2016-12-21 NOTE — ED Notes (Signed)
Registration at bedside at this time.

## 2016-12-21 NOTE — ED Notes (Signed)
Patient transported to X-ray 

## 2016-12-21 NOTE — ED Notes (Signed)
Notified Ortho of need for crutches and knee immobilizer.

## 2016-12-21 NOTE — ED Triage Notes (Signed)
Patient with right knee pain since yesterday and getting worse.  Patient denies any injury to the area.

## 2017-01-05 ENCOUNTER — Ambulatory Visit: Payer: Medicaid Other | Admitting: Family Medicine

## 2017-01-21 ENCOUNTER — Ambulatory Visit (INDEPENDENT_AMBULATORY_CARE_PROVIDER_SITE_OTHER): Payer: Medicaid Other | Admitting: Family Medicine

## 2017-01-21 ENCOUNTER — Encounter: Payer: Self-pay | Admitting: Family Medicine

## 2017-01-21 VITALS — BP 119/64 | HR 89 | Temp 98.0°F | Resp 14 | Ht 64.0 in | Wt 341.0 lb

## 2017-01-21 DIAGNOSIS — R609 Edema, unspecified: Secondary | ICD-10-CM | POA: Diagnosis not present

## 2017-01-21 DIAGNOSIS — E039 Hypothyroidism, unspecified: Secondary | ICD-10-CM

## 2017-01-21 DIAGNOSIS — I5189 Other ill-defined heart diseases: Secondary | ICD-10-CM

## 2017-01-21 DIAGNOSIS — G629 Polyneuropathy, unspecified: Secondary | ICD-10-CM

## 2017-01-21 DIAGNOSIS — G894 Chronic pain syndrome: Secondary | ICD-10-CM

## 2017-01-21 DIAGNOSIS — I509 Heart failure, unspecified: Secondary | ICD-10-CM

## 2017-01-21 DIAGNOSIS — R7989 Other specified abnormal findings of blood chemistry: Secondary | ICD-10-CM

## 2017-01-21 DIAGNOSIS — I519 Heart disease, unspecified: Secondary | ICD-10-CM

## 2017-01-21 LAB — POCT URINALYSIS DIP (DEVICE)
Bilirubin Urine: NEGATIVE
GLUCOSE, UA: NEGATIVE mg/dL
Hgb urine dipstick: NEGATIVE
Ketones, ur: NEGATIVE mg/dL
Leukocytes, UA: NEGATIVE
NITRITE: NEGATIVE
PH: 5.5 (ref 5.0–8.0)
PROTEIN: NEGATIVE mg/dL
Specific Gravity, Urine: 1.025 (ref 1.005–1.030)
UROBILINOGEN UA: 0.2 mg/dL (ref 0.0–1.0)

## 2017-01-21 MED ORDER — FUROSEMIDE 40 MG PO TABS: 40.0000 mg | ORAL_TABLET | Freq: Two times a day (BID) | ORAL | 0 refills | Status: DC

## 2017-01-21 MED ORDER — DULOXETINE HCL 30 MG PO CPEP: 30.0000 mg | ORAL_CAPSULE | Freq: Two times a day (BID) | ORAL | 1 refills | Status: DC

## 2017-01-21 MED ORDER — GABAPENTIN 300 MG PO CAPS: 300.0000 mg | ORAL_CAPSULE | Freq: Four times a day (QID) | ORAL | 2 refills | Status: DC

## 2017-01-21 MED ORDER — SPIRONOLACTONE-HCTZ 25-25 MG PO TABS: 1.0000 | ORAL_TABLET | Freq: Every day | ORAL | 11 refills | Status: DC

## 2017-01-21 MED ORDER — POTASSIUM CHLORIDE CRYS ER 10 MEQ PO TBCR: 10.0000 meq | EXTENDED_RELEASE_TABLET | Freq: Two times a day (BID) | ORAL | 0 refills | Status: DC

## 2017-01-21 NOTE — Progress Notes (Signed)
Patient ID: Tabitha Scott, female    DOB: [birth year], 51 y.o.   MRN: [id number]  PCP: Bing Neighbors, FNP  Chief Complaint  Patient presents with  . Follow-up    BACK PAIN AND KNEE PAIN    Subjective:  HPI Tabitha Scott is a 51 y.o. female presents for a 6 months follow-up of multiple conditions. Medical problems COPD, OSA w/CPAP, T2DM, Degenerative Disc Disease,   Chronic Knee and Back Pain  Bilateral knee pain causing recent falls due to "weakness of knees" and bilateral back pain. Reports constant pain and characterized as aching to occasionally sharp in back and knees. Occasional sciatica shooting in the bilateral legs.Neuropathy upper arms, lower legs, fingers and toes. Neuropathy persistent for several years. Currently prescribed Gabapentin and muscle relaxants.  She reports prior evaluation of knees several years and was recommended to loose weight prior to any plans for surgical interventions. She has previously requested a referral to pain management and has never received an appointment.  T2DM Diagnosed several years ago. Reports good control, with glucose readings consistently less than 125. Last A1C 5.6 in 03/2016 which is inconsistent with a diagnosis of diabetes. She is managed with Victoza and Tradjenta. Reports being told at various specialty appointments that she no longer had diabetes and however she was afraid to stop medication.  Diastolic Dysfunction (Grade 2) 04/16/16 Negative nuclear stress test, 09/30/2016. She denies any associated chest pain although has  Chronic dyspnea which she attributes to COPD. Last episode of atypical chest pain 04/15/2016. Denies any other episodes of chest pain since that time. Chronic lower extremity edema. Has taken  Lasix in the past minimal relief of swelling.  COPD/OSA Followed by Dr. Sherene Sires, pulmonology. Reports no further use if home oxygen. During recent visit with Dr. Sherene Sires he Measured her TSH level which was elevated. She was  advised to increase levothyroxine, however opted not to until she came for follow-up.    Social History   Social History  . Marital status: Divorced    Spouse name: N/A  . Number of children: N/A  . Years of education: N/A   Occupational History  . Not on file.   Social History Main Topics  . Smoking status: Former Smoker    Packs/day: 1.50    Years: 41.00    Types: Cigarettes    Quit date: 02/11/2015  . Smokeless tobacco: Never Used  . Alcohol use No  . Drug use: No  . Sexual activity: No   Other Topics Concern  . Not on file   Social History Narrative  . No narrative on file    Family History  Problem Relation Age of Onset  . COPD Mother   . Heart disease Father   . Cancer Maternal Grandmother    Review of Systems See HPI Patient Active Problem List   Diagnosis Date Noted  . Dyspnea on exertion 12/14/2016  . Diastolic dysfunction 12/14/2016  . COPD GOLD 0  09/14/2016  . Esophageal reflux   . Pulmonary emphysema (HCC)   . Pain in the chest 04/15/2016  . Anemia 04/15/2016  . Hypokalemia 10/18/2015  . ARF (acute renal failure) (HCC) 10/18/2015  . Diabetes mellitus type 2, controlled (HCC) 10/18/2015  . Cough   . Acute respiratory failure with hypoxia (HCC)   . OSA on CPAP   . Morbid (severe) obesity due to excess calories (HCC)   . Diabetes mellitus type 2, noninsulin dependent (HCC)   . Diabetes (HCC) 04/09/2015  . Hypothyroidism  04/09/2015  . Pedal edema 04/09/2015  . DDD (degenerative disc disease), lumbar 04/09/2015  . Pure hypercholesterolemia 04/09/2015    Allergies  Allergen Reactions  . Tetanus Toxoids Swelling    Prior to Admission medications   Medication Sig Start Date End Date Taking? Authorizing Provider  albuterol (PROVENTIL HFA;VENTOLIN HFA) 108 (90 Base) MCG/ACT inhaler Inhale 1-2 puffs into the lungs every 6 (six) hours as needed for wheezing or shortness of breath. 07/23/16  Yes Henrietta HooverBernhardt, Linda C, NP  atorvastatin (LIPITOR) 40  MG tablet Take 1 tablet (40 mg total) by mouth daily. 09/23/16  Yes Henrietta HooverBernhardt, Linda C, NP  gabapentin (NEURONTIN) 300 MG capsule Take 1 capsule (300 mg total) by mouth 2 (two) times daily. 07/23/16  Yes Henrietta HooverBernhardt, Linda C, NP  Insulin Pen Needle (B-D UF III MINI PEN NEEDLES) 31G X 5 MM MISC 1 each by Does not apply route daily. 07/23/16  Yes Henrietta HooverBernhardt, Linda C, NP  levothyroxine (SYNTHROID, LEVOTHROID) 25 MCG tablet Take 1 tablet (25 mcg total) by mouth daily before breakfast. 09/23/16  Yes Henrietta HooverBernhardt, Linda C, NP  linagliptin (TRADJENTA) 5 MG TABS tablet Take 1 tablet (5 mg total) by mouth daily. 09/23/16  Yes Henrietta HooverBernhardt, Linda C, NP  liraglutide (VICTOZA) 18 MG/3ML SOPN Inject 0.3 mLs (1.8 mg total) into the skin daily. 07/23/16  Yes Henrietta HooverBernhardt, Linda C, NP  omeprazole (PRILOSEC) 40 MG capsule Take 1 capsule (40 mg total) by mouth 2 (two) times daily. 07/23/16  Yes Henrietta HooverBernhardt, Linda C, NP  spironolactone-hydrochlorothiazide (ALDACTAZIDE) 25-25 MG tablet Take 1 tablet by mouth daily. 12/14/16  Yes Nyoka CowdenWert, Michael B, MD  tiZANidine (ZANAFLEX) 4 MG capsule Take 1 capsule (4 mg total) by mouth 3 (three) times daily as needed for muscle spasms. 09/23/16  Yes Henrietta HooverBernhardt, Linda C, NP  ibuprofen (ADVIL,MOTRIN) 600 MG tablet Take 1 tablet (600 mg total) by mouth every 6 (six) hours. 12/21/16   Felicie MornSmith, David, NP    Past Medical, Surgical Family and Social History reviewed and updated.    Objective:   Today's Vitals   01/21/17 1504  BP: 119/64  Pulse: 89  Resp: 14  Temp: 98 F (36.7 C)  TempSrc: Oral  SpO2: 95%  Weight: (!) 341 lb (154.7 kg)  Height: 5\' 4"  (1.626 m)    Wt Readings from Last 3 Encounters:  01/21/17 (!) 341 lb (154.7 kg)  12/14/16 (!) 341 lb (154.7 kg)  09/29/16 (!) 312 lb (141.5 kg)    Physical Exam  Constitutional: She is oriented to person, place, and time. She appears well-developed and well-nourished.  HENT:  Head: Normocephalic and atraumatic.  Mouth/Throat: Oropharynx is clear  and moist.  Eyes: Conjunctivae are normal. Pupils are equal, round, and reactive to light.  Neck: Normal range of motion. Neck supple.  Cardiovascular: Normal rate, regular rhythm, normal heart sounds and intact distal pulses.   Pulmonary/Chest: Effort normal and breath sounds normal.  Abdominal: Soft. Bowel sounds are normal. She exhibits no distension.  Obese   Musculoskeletal: Normal range of motion.  Neurological: She is alert and oriented to person, place, and time.  Skin: Skin is warm and dry.  Psychiatric: She has a normal mood and affect. Her behavior is normal. Judgment and thought content normal.   Assessment & Plan:  1. Chronic pain syndrome - Ambulatory referral to Pain Clinic  2. Neuropathy - Ambulatory referral to Orthopedic Surgery - gabapentin (NEURONTIN) 300 MG capsule; Take 1 capsule (300 mg total) by mouth 4 (four) times daily.   -  A1C 5.6, no evidence of any diabetes. Discontinue Victoza and Tradjenta  3. Hypothyroidism, unspecified type - Thyroid Panel With TSH -Continue levothyroxine 25 mcg daily.  If TSH is abnormal, will adjust dose.  4. Edema, unspecified type - COMPLETE METABOLIC PANEL WITH GFR - CBC with Differential - Brain natriuretic peptide - Lipid panel  5. Diastolic dysfunction - Ambulatory referral to Cardiology - spironolactone-hydrochlorothiazide (ALDACTAZIDE) 25-25 MG tablet; Take 1 tablet by mouth daily.     RTC: 6 weeks to complete PAP and evaluate glucose    Godfrey Pick. Tiburcio Pea, MSN, FNP-C [facility]  [address] [place], [place] [address] [phone]

## 2017-01-21 NOTE — Patient Instructions (Signed)
I am placing a referral for you to pain management, orthopedics, and cardiology. For pain, increase  Gabapentin 300 mg x 4 times per day. I am starting you on Cymbalta 30 mg x 7 days and then increase to 60 mg once daily.  We will contact you about your labs and pending referrals.  Hold your Spirolactone/HCTZ for the next 3 days and take Lasix 40 mg twice daily x 3 days. With each lasix dose, take KDUR 10 MEQ to prevent potassium loss.   Chronic Pain, Adult Chronic pain is a type of pain that lasts or keeps coming back (recurs) for at least six months. You may have chronic headaches, abdominal pain, or body pain. Chronic pain may be related to an illness, such as fibromyalgia or complex regional pain syndrome. Sometimes the cause of chronic pain is not known. Chronic pain can make it hard for you to do daily activities. If not treated, chronic pain can lead to other health problems, including anxiety and depression. Treatment depends on the cause and severity of your pain. You may need to work with a pain specialist to come up with a treatment plan. The plan may include medicine, counseling, and physical therapy. Many people benefit from a combination of two or more types of treatment to control their pain. Follow these instructions at home: Lifestyle  Consider keeping a pain diary to share with your health care providers.  Consider talking with a mental health care provider (psychologist) about how to cope with chronic pain.  Consider joining a chronic pain support group.  Try to control or lower your stress levels. Talk to your health care provider about strategies to do this. General instructions   Take over-the-counter and prescription medicines only as told by your health care provider.  Follow your treatment plan as told by your health care provider. This may include: ? Gentle, regular exercise. ? Eating a healthy diet that includes foods such as vegetables, fruits, fish, and lean  meats.  ? Cognitive or behavioral therapy. ? Working with a Adult nurse. ? Meditation or yoga. ? Acupuncture or massage therapy. ? Aroma, color, light, or sound therapy. ? Local electrical stimulation. ? Shots (injections) of numbing or pain-relieving medicines into the spine or the area of pain.  Check your pain level as told by your health care provider. Ask your health care provider if you should use a pain scale.  Learn as much as you can about how to manage your chronic pain. Ask your health care provider if an intensive pain rehabilitation program or a chronic pain specialist would be helpful.  Keep all follow-up visits as told by your health care provider. This is important. Contact a health care provider if:  Your pain gets worse.  You have new pain.  You have trouble sleeping.  You have trouble doing your normal activities.  Your pain is not controlled with treatment.  Your have side effects from pain medicine.  You feel weak. Get help right away if:  You lose feeling or have numbness in your body.  You lose control of bowel or bladder function.  Your pain suddenly gets much worse.  You develop shaking or chills.  You develop confusion.  You develop chest pain.  You have trouble breathing or shortness of breath.  You pass out.  You have thoughts about hurting yourself or others. This information is not intended to replace advice given to you by your health care provider. Make sure you discuss any questions  you have with your health care provider. Document Released: 05/02/2002 Document Revised: 04/09/2016 Document Reviewed: 01/28/2016 Elsevier Interactive Patient Education  2017 [employer]

## 2017-01-22 LAB — CBC WITH DIFFERENTIAL/PLATELET
Basophils Absolute: 0 cells/uL (ref 0–200)
Basophils Relative: 0 %
EOS PCT: 3 %
Eosinophils Absolute: 231 cells/uL (ref 15–500)
HCT: 36.9 % (ref 35.0–45.0)
HEMOGLOBIN: 11.6 g/dL — AB (ref 11.7–15.5)
LYMPHS ABS: 2541 {cells}/uL (ref 850–3900)
Lymphocytes Relative: 33 %
MCH: 27.2 pg (ref 27.0–33.0)
MCHC: 31.4 g/dL — AB (ref 32.0–36.0)
MCV: 86.6 fL (ref 80.0–100.0)
MONOS PCT: 5 %
MPV: 9.7 fL (ref 7.5–12.5)
Monocytes Absolute: 385 cells/uL (ref 200–950)
NEUTROS PCT: 59 %
Neutro Abs: 4543 cells/uL (ref 1500–7800)
PLATELETS: 195 10*3/uL (ref 140–400)
RBC: 4.26 MIL/uL (ref 3.80–5.10)
RDW: 16.9 % — AB (ref 11.0–15.0)
WBC: 7.7 10*3/uL (ref 3.8–10.8)

## 2017-01-22 LAB — COMPLETE METABOLIC PANEL WITH GFR
ALBUMIN: 3.8 g/dL (ref 3.6–5.1)
ALK PHOS: 83 U/L (ref 33–130)
ALT: 21 U/L (ref 6–29)
AST: 24 U/L (ref 10–35)
BILIRUBIN TOTAL: 0.6 mg/dL (ref 0.2–1.2)
BUN: 14 mg/dL (ref 7–25)
CALCIUM: 8.9 mg/dL (ref 8.6–10.4)
CO2: 26 mmol/L (ref 20–31)
Chloride: 100 mmol/L (ref 98–110)
Creat: 1.16 mg/dL — ABNORMAL HIGH (ref 0.50–1.05)
GFR, EST NON AFRICAN AMERICAN: 54 mL/min — AB (ref >=60)
GFR, Est African American: 63 mL/min (ref >=60)
Glucose, Bld: 85 mg/dL (ref 65–99)
POTASSIUM: 4.4 mmol/L (ref 3.5–5.3)
Sodium: 140 mmol/L (ref 135–146)
Total Protein: 7.2 g/dL (ref 6.1–8.1)

## 2017-01-22 LAB — THYROID PANEL WITH TSH
Free Thyroxine Index: 2.1 (ref 1.4–3.8)
T3 UPTAKE: 31 % (ref 22–35)
T4 TOTAL: 6.8 ug/dL (ref 4.5–12.0)
TSH: 3.69 mIU/L

## 2017-01-22 LAB — LIPID PANEL
CHOLESTEROL: 144 mg/dL (ref <200)
HDL: 37 mg/dL — ABNORMAL LOW (ref >50)
LDL Cholesterol: 69 mg/dL (ref <100)
Total CHOL/HDL Ratio: 3.9 Ratio (ref <5.0)
Triglycerides: 191 mg/dL — ABNORMAL HIGH (ref <150)
VLDL: 38 mg/dL — ABNORMAL HIGH (ref <30)

## 2017-01-22 LAB — BRAIN NATRIURETIC PEPTIDE: BRAIN NATRIURETIC PEPTIDE: 39.5 pg/mL (ref <100)

## 2017-01-25 ENCOUNTER — Telehealth: Payer: Self-pay

## 2017-01-25 ENCOUNTER — Encounter: Payer: Self-pay | Admitting: Cardiovascular Disease

## 2017-01-25 ENCOUNTER — Ambulatory Visit (INDEPENDENT_AMBULATORY_CARE_PROVIDER_SITE_OTHER): Payer: Medicaid Other | Admitting: Cardiovascular Disease

## 2017-01-25 VITALS — BP 136/86 | HR 102 | Ht 64.0 in | Wt 332.0 lb

## 2017-01-25 DIAGNOSIS — E039 Hypothyroidism, unspecified: Secondary | ICD-10-CM | POA: Diagnosis not present

## 2017-01-25 DIAGNOSIS — E119 Type 2 diabetes mellitus without complications: Secondary | ICD-10-CM | POA: Diagnosis not present

## 2017-01-25 DIAGNOSIS — I5032 Chronic diastolic (congestive) heart failure: Secondary | ICD-10-CM

## 2017-01-25 DIAGNOSIS — E669 Obesity, unspecified: Secondary | ICD-10-CM | POA: Diagnosis not present

## 2017-01-25 DIAGNOSIS — G4733 Obstructive sleep apnea (adult) (pediatric): Secondary | ICD-10-CM

## 2017-01-25 DIAGNOSIS — I10 Essential (primary) hypertension: Secondary | ICD-10-CM

## 2017-01-25 NOTE — Progress Notes (Signed)
Cardiology Consultation Note:    Date:  01/28/2017   ID:  Tabitha Scott, DOB [birth year], MRN [id number]  PCP:  Bing Neighbors, FNP  Cardiologist:  Thurmon Fair, MD    Referring MD: Bing Neighbors, FNP   Chief Complaint  Patient presents with       New patient. Heart failure.  . Shortness of Breath  . Edema  . Chest Pain    At times.  Tabitha Scott is a 50 y.o. female who is being seen today for the evaluation of Edema and dyspnea at the request of Bing Neighbors, FNP.   History of Present Illness:    Tabitha Scott is a 51 y.o. female with a hx of Super obesity, neuropathy, obstructive sleep apnea on CPAP, treated hypothyroidism, hypertension, hyperlipidemia, type 2 diabetes mellitus, presenting with complaints of dyspnea and swelling.   Despite treatment with twice daily furosemide 40 mg she has not noticed any improvement in these complaints. She states that she often will take the Lasix during the day without any noticeable increase in urine output. However at night she wakes up every 2 hours to urinate copious amounts. She is also taking Aldactazide for blood pressure.  She does have a prescription for ibuprofen 600 mg, as needed for arthritis, but takes this rarely, less than once a week. He has bilateral knee pain and back pain as well as occasional sciatica.  She denies chest pain or tightness in the last year. She has not had palpitations. She denies syncope or dizziness. She has chronic lower extremity edema that does not resolve over night. Denies focal neurological complaints other than long-standing numbness and tingling in hands and feet for which she is taking both Neurontin and Cymbalta.  She reports compliance with CPAP at night, but has not had her device monitored or seen a sleep specialist in at least a couple of years. She does have daytime hypersomnolence. She has seen Dr. Sherene Sires. He recommended increasing her dose of levothyroxine.  She quit smoking 2 years ago  and gained roughly 60 pounds. Has a roughly 40 pack year smoking history.  She had a nuclear stress test in February which showed a mid septal artifact, otherwise normal perfusion and EF. Her echo in August 2017 shows mild LVH and grade 2 diastolic dysfunction, EF 55-60 %. PA pressure could not be evaluated. The inferior vena cava was not dilated.  Past Medical History:  Diagnosis Date  . Arthritis   . Chronic left shoulder pain   . Chronic lower back pain   . COPD (chronic obstructive pulmonary disease) (HCC)   . High cholesterol   . Hyperlipidemia   . Hypothyroidism   . Neuropathy   . On home oxygen therapy    "have it available; quit smoking; not using it" (04/16/2016)  . OSA (obstructive sleep apnea)    "have mask; don't use" (04/16/2016)  . Thyroid disease   . Type II diabetes mellitus (HCC)     Past Surgical History:  Procedure Laterality Date  . CARPAL TUNNEL RELEASE Bilateral 2009  . DILATION AND CURETTAGE OF UTERUS    . JOINT REPLACEMENT Left 2015   thumb  . KNEE SURGERY Left 1970s   tissue removed  . LAPAROSCOPIC CHOLECYSTECTOMY  ~ 2014  . NEUROPLASTY / TRANSPOSITION ULNAR NERVE AT ELBOW Bilateral 2009   nerver repair to both arms   . TUBAL LIGATION      Current Medications: Current Meds  Medication Sig  . albuterol (PROVENTIL HFA;VENTOLIN  HFA) 108 (90 Base) MCG/ACT inhaler Inhale 1-2 puffs into the lungs every 6 (six) hours as needed for wheezing or shortness of breath.  Marland Kitchen atorvastatin (LIPITOR) 40 MG tablet Take 1 tablet (40 mg total) by mouth daily.  . DULoxetine (CYMBALTA) 30 MG capsule Take 1 capsule (30 mg total) by mouth 2 (two) times daily. Take 1 tablet once daily for 7 days, then 2 tablets daily  . gabapentin (NEURONTIN) 300 MG capsule Take 1 capsule (300 mg total) by mouth 4 (four) times daily.  Marland Kitchen ibuprofen (ADVIL,MOTRIN) 600 MG tablet Take 1 tablet (600 mg total) by mouth every 6 (six) hours.  Marland Kitchen levothyroxine (SYNTHROID, LEVOTHROID) 25 MCG tablet Take 1  tablet (25 mcg total) by mouth daily before breakfast.  . omeprazole (PRILOSEC) 40 MG capsule Take 1 capsule (40 mg total) by mouth 2 (two) times daily.  Marland Kitchen spironolactone-hydrochlorothiazide (ALDACTAZIDE) 25-25 MG tablet Take 1 tablet by mouth daily.  Marland Kitchen tiZANidine (ZANAFLEX) 4 MG capsule Take 1 capsule (4 mg total) by mouth 3 (three) times daily as needed for muscle spasms.     Allergies:   Tetanus toxoids   Social History   Social History  . Marital status: Divorced    Spouse name: N/A  . Number of children: N/A  . Years of education: N/A   Social History Main Topics  . Smoking status: Former Smoker    Packs/day: 1.50    Years: 41.00    Types: Cigarettes    Quit date: 02/11/2015  . Smokeless tobacco: Never Used  . Alcohol use No  . Drug use: No  . Sexual activity: No   Other Topics Concern  . None   Social History Narrative  . None     Family History: The patient'sfamily history includes COPD in her mother; Cancer in her maternal grandmother; Heart disease in her father. ROS:   Please see the history of present illness.     All other systems reviewed and are negative.  EKGs/Labs/Other Studies Reviewed:    The following studies were reviewed today: Notes from pulmonary clinic and primary care provider. Nuclear stress test images and echo images.  EKG:  EKG is  ordered today.  The ekg ordered today demonstrates sinus tachycardia with minor nonspecific T-wave changes QTC 448 ms  Recent Labs: 12/14/2016: Pro B Natriuretic peptide (BNP) 50.0 01/21/2017: ALT 21; Brain Natriuretic Peptide 39.5; BUN 14; Creat 1.16; Hemoglobin 11.6; Platelets 195; Potassium 4.4; Sodium 140; TSH 3.69   Recent Lipid Panel    Component Value Date/Time   CHOL 144 01/21/2017 1610   TRIG 191 (H) 01/21/2017 1610   HDL 37 (L) 01/21/2017 1610   CHOLHDL 3.9 01/21/2017 1610   VLDL 38 (H) 01/21/2017 1610   LDLCALC 69 01/21/2017 1610    Physical Exam:    VS:  BP 136/86   Pulse (!) 102   Ht  5\' 4"  (1.626 m)   Wt (!) 332 lb (150.6 kg)   BMI 56.99 kg/m     Wt Readings from Last 3 Encounters:  01/25/17 (!) 332 lb (150.6 kg)  01/21/17 (!) 341 lb (154.7 kg)  12/14/16 (!) 341 lb (154.7 kg)     GEN: Superobese, which limits her exam Well nourished, well developed in no acute distress HEENT: Normal NECK: No JVD; No carotid bruits LYMPHATICS: No lymphadenopathy CARDIAC: RRR, no murmurs, rubs, gallops RESPIRATORY:  Clear to auscultation without rales, wheezing or rhonchi  ABDOMEN: Soft, non-tender, non-distended MUSCULOSKELETAL:  1-2+ ankle edema; No deformity  SKIN: Warm and dry NEUROLOGIC:  Alert and oriented x 3 PSYCHIATRIC:  Normal affect   ASSESSMENT:    1. Chronic diastolic heart failure (HCC)   2. Essential hypertension   3. Obstructive sleep apnea   4. Controlled type 2 diabetes mellitus without complication, without long-term current use of insulin (HCC)   5. Super obesity   6. Acquired hypothyroidism    PLAN:    In order of problems listed above:  1. CHF: There is fairly convincing evidence that she has diastolic heart failure. The BNP is not reliable with her degree of obesity. Volume status is very difficult to assess with her degree of obesity. Although she has heart failure, I'm sure that her severe obesity any obstructive sleep apnea and the COPD are contributing to a large degree to her dyspnea. To help with the nocturia, advised that she take the second dose of furosemide in the early afternoon rather than at 7 PM like she is currently doing. May also have optimal diuresis if she takes her thiazide diuretic for at least 30 minutes before her furosemide in the morning. 2. HTN: Fairly well-controlled. 3. OSA: Her prominent complaints of nocturia and daytime hypersomnolence make me worry that her CPAP is not effective. I have advised follow up in the sleep clinic, to bring her machine and memory chip with her. 4. DM: Surprisingly, this is a questionable  diagnosis. Her hemoglobin A1c is excellent at 5.6%. She has  been advised to stop taking treatment for diabetes. Fasting glucose was 85 just a few days ago. 5. Super obesity: Clearly is the underlying cause for most if not all of her current medical problems. Aggressive weight loss, including bariatric surgery is indicated. 6. Hypothyroidism: Most recent labs showed normal TSH and T4 levels on a very low dose of thyroid supplementation.   Medication Adjustments/Labs and Tests Ordered: Current medicines are reviewed at length with the patient today.  Concerns regarding medicines are outlined above. Labs and tests ordered and medication changes are outlined in the patient instructions below:  Patient Instructions  Dr Royann Shiversroitoru has recommended making the following medication changes: 1. TAKE Spironolactone-HCT in the morning 2. TAKE your first dose of Furosemide 30 minutes after Spironolactone-HCT 3. TAKE your second dose of Furosemide between 2 pm-4 pm  Your physician recommends that you schedule a sleep clinic appointment with Dr Tresa EndoKelly first available. **Bring your equipment with you to this appointment. **Please let the staff know the company that supplies your equipment.  Dr Royann Shiversroitoru recommends that you schedule a follow-up appointment in first available.    Signed, Thurmon FairMihai Anjolaoluwa Siguenza, MD  01/28/2017 4:11 PM    [facility]

## 2017-01-25 NOTE — Telephone Encounter (Signed)
Referral sent to pain management and ortho

## 2017-01-25 NOTE — Telephone Encounter (Signed)
-----   Message from Bing NeighborsKimberly S Harris, FNP sent at 01/24/2017  9:27 PM EDT ----- Please see that patient is referred to pain management and ortho surgury

## 2017-01-25 NOTE — Patient Instructions (Addendum)
Dr Royann Shiversroitoru has recommended making the following medication changes: 1. TAKE Spironolactone-HCT in the morning 2. TAKE your first dose of Furosemide 30 minutes after Spironolactone-HCT 3. TAKE your second dose of Furosemide between 2 pm-4 pm  Your physician recommends that you schedule a sleep clinic appointment with Dr Tresa EndoKelly first available. **Bring your equipment with you to this appointment. **Please let the staff know the company that supplies your equipment.  Dr Royann Shiversroitoru recommends that you schedule a follow-up appointment in first available.

## 2017-01-26 NOTE — Addendum Note (Signed)
Addended by: Bing NeighborsHARRIS, Valari S on: 01/26/2017 02:04 AM   Modules accepted: Orders

## 2017-01-28 DIAGNOSIS — I10 Essential (primary) hypertension: Secondary | ICD-10-CM | POA: Insufficient documentation

## 2017-02-18 ENCOUNTER — Telehealth: Payer: Self-pay

## 2017-02-19 NOTE — Telephone Encounter (Signed)
Spoke with patient and let her know that she had a appointment on 12/28/2016 and she no show appointment for Ortho. Patient was given the information to call back and schedule. Patient has already been scheduled with Cardiology and I am still working getting her accept with Pain management

## 2017-03-04 ENCOUNTER — Telehealth: Payer: Self-pay

## 2017-03-10 NOTE — Telephone Encounter (Signed)
Let patient that I am still trying get her accept in a pain management. Patient was not/ accepted by [facility]. I will be sending a referral to [facility] on 03/10/2017

## 2017-03-15 ENCOUNTER — Ambulatory Visit (INDEPENDENT_AMBULATORY_CARE_PROVIDER_SITE_OTHER): Payer: Medicaid Other | Admitting: Family Medicine

## 2017-03-15 ENCOUNTER — Other Ambulatory Visit (HOSPITAL_COMMUNITY)
Admission: RE | Admit: 2017-03-15 | Discharge: 2017-03-15 | Disposition: A | Discharge status: Home / Self Care | Payer: Medicaid Other | Source: Ambulatory Visit | Attending: Family Medicine | Admitting: Family Medicine

## 2017-03-15 ENCOUNTER — Ambulatory Visit: Payer: Medicaid Other | Admitting: Internal Medicine

## 2017-03-15 ENCOUNTER — Encounter: Payer: Self-pay | Admitting: Family Medicine

## 2017-03-15 VITALS — BP 142/88 | HR 90 | Temp 98.1°F | Resp 14 | Ht 64.0 in | Wt 343.6 lb

## 2017-03-15 DIAGNOSIS — G4733 Obstructive sleep apnea (adult) (pediatric): Secondary | ICD-10-CM

## 2017-03-15 DIAGNOSIS — M25561 Pain in right knee: Secondary | ICD-10-CM

## 2017-03-15 DIAGNOSIS — M25562 Pain in left knee: Secondary | ICD-10-CM

## 2017-03-15 DIAGNOSIS — R220 Localized swelling, mass and lump, head: Secondary | ICD-10-CM

## 2017-03-15 DIAGNOSIS — E119 Type 2 diabetes mellitus without complications: Secondary | ICD-10-CM

## 2017-03-15 DIAGNOSIS — N289 Disorder of kidney and ureter, unspecified: Secondary | ICD-10-CM

## 2017-03-15 DIAGNOSIS — J3489 Other specified disorders of nose and nasal sinuses: Secondary | ICD-10-CM

## 2017-03-15 DIAGNOSIS — Z0000 Encounter for general adult medical examination without abnormal findings: Secondary | ICD-10-CM | POA: Diagnosis not present

## 2017-03-15 DIAGNOSIS — G8929 Other chronic pain: Secondary | ICD-10-CM | POA: Diagnosis not present

## 2017-03-15 LAB — POCT URINALYSIS DIP (DEVICE)
Bilirubin Urine: NEGATIVE
GLUCOSE, UA: NEGATIVE mg/dL
Hgb urine dipstick: NEGATIVE
Ketones, ur: NEGATIVE mg/dL
NITRITE: NEGATIVE
Protein, ur: 30 mg/dL — AB
Specific Gravity, Urine: 1.02 (ref 1.005–1.030)
UROBILINOGEN UA: 1 mg/dL (ref 0.0–1.0)
pH: 7 (ref 5.0–8.0)

## 2017-03-15 LAB — RENAL FUNCTION PANEL
ALBUMIN: 3.8 g/dL (ref 3.6–5.1)
BUN: 15 mg/dL (ref 7–25)
CHLORIDE: 99 mmol/L (ref 98–110)
CO2: 30 mmol/L (ref 20–31)
Calcium: 8.8 mg/dL (ref 8.6–10.4)
Creat: 1.08 mg/dL — ABNORMAL HIGH (ref 0.50–1.05)
Glucose, Bld: 101 mg/dL — ABNORMAL HIGH (ref 65–99)
Phosphorus: 3.3 mg/dL (ref 2.5–4.5)
Potassium: 4.3 mmol/L (ref 3.5–5.3)
SODIUM: 139 mmol/L (ref 135–146)

## 2017-03-15 NOTE — Progress Notes (Signed)
Patient ID: Tabitha Scott, female    DOB: 01/08/1966, 51 y.o.   MRN: [id number]  PCP: Tabitha Scott  Chief Complaint  Patient presents with  . Gynecologic Exam  . Spot on nose     Subjective:  HPI Tabitha Scott is a 51 y.o. female presents for gynecological exam and mass right side of nose.  Mass on Nose Tabitha Scott reports that the mass on the upper right bridge of her nose appeared about 4 months ago as a small "bump" and has gradually increased in size to the point that it is obstructing her visual field from her right eye. Mass is non-tender and non-draining. She denies having any similar appearing lesions present on her body.  Gynecological Exam  Tabitha Scott is uncertain of when she had her last gynecological exam. Reports a history of abnormal pap in which she was told that she had abnormal cells. She denies any associated vaginal discharge, itching, odor, bleeding, or dysuria. She had a normal mammogram in January 2018.  Social History   Social History  . Marital status: Divorced    Spouse name: N/A  . Number of children: N/A  . Years of education: N/A   Occupational History  . Not on file.   Social History Main Topics  . Smoking status: Former Smoker    Packs/day: 1.50    Years: 41.00    Types: Cigarettes    Quit date: 02/11/2015  . Smokeless tobacco: Never Used  . Alcohol use No  . Drug use: No  . Sexual activity: No   Other Topics Concern  . Not on file   Social History Narrative  . No narrative on file    Family History  Problem Relation Age of Onset  . COPD Mother   . Heart disease Father   . Cancer Maternal Grandmother    Review of Systems See HPI Patient Active Problem List   Diagnosis Date Noted  . Essential hypertension 01/28/2017  . Dyspnea on exertion 12/14/2016  . Diastolic dysfunction 12/14/2016  . COPD GOLD 0  09/14/2016  . Esophageal reflux   . Pulmonary emphysema (HCC)   . Pain in the chest 04/15/2016  . Anemia 04/15/2016  . Hypokalemia  10/18/2015  . ARF (acute renal failure) (HCC) 10/18/2015  . Diabetes mellitus type 2, controlled (HCC) 10/18/2015  . Cough   . Acute respiratory failure with hypoxia (HCC)   . OSA on CPAP   . Morbid (severe) obesity due to excess calories (HCC)   . Diabetes mellitus type 2, noninsulin dependent (HCC)   . Diabetes (HCC) 04/09/2015  . Hypothyroidism 04/09/2015  . Pedal edema 04/09/2015  . DDD (degenerative disc disease), lumbar 04/09/2015  . Pure hypercholesterolemia 04/09/2015    Allergies  Allergen Reactions  . Tetanus Toxoids Swelling    Prior to Admission medications   Medication Sig Start Date End Date Taking? Authorizing Provider  albuterol (PROVENTIL HFA;VENTOLIN HFA) 108 (90 Base) MCG/ACT inhaler Inhale 1-2 puffs into the lungs every 6 (six) hours as needed for wheezing or shortness of breath. 07/23/16  Yes Henrietta HooverBernhardt, Linda C, NP  atorvastatin (LIPITOR) 40 MG tablet Take 1 tablet (40 mg total) by mouth daily. 09/23/16  Yes Henrietta HooverBernhardt, Linda C, NP  DULoxetine (CYMBALTA) 30 MG capsule Take 1 capsule (30 mg total) by mouth 2 (two) times daily. Take 1 tablet once daily for 7 days, then 2 tablets daily 01/21/17  Yes Tabitha NeighborsHarris, Lisbeth S, Scott  gabapentin (NEURONTIN) 300 MG capsule Take 1 capsule (  300 mg total) by mouth 4 (four) times daily. 01/21/17  Yes Tabitha Neighbors, Scott  ibuprofen (ADVIL,MOTRIN) 600 MG tablet Take 1 tablet (600 mg total) by mouth every 6 (six) hours. 12/21/16  Yes Felicie Morn, NP  levothyroxine (SYNTHROID, LEVOTHROID) 25 MCG tablet Take 1 tablet (25 mcg total) by mouth daily before breakfast. 09/23/16  Yes Henrietta Hoover, NP  omeprazole (PRILOSEC) 40 MG capsule Take 1 capsule (40 mg total) by mouth 2 (two) times daily. 07/23/16  Yes Henrietta Hoover, NP  spironolactone-hydrochlorothiazide (ALDACTAZIDE) 25-25 MG tablet Take 1 tablet by mouth daily. 01/21/17  Yes Tabitha Neighbors, Scott  tiZANidine (ZANAFLEX) 4 MG capsule Take 1 capsule (4 mg total) by mouth 3  (three) times daily as needed for muscle spasms. 09/23/16  Yes Henrietta Hoover, NP  furosemide (LASIX) 40 MG tablet Take 1 tablet (40 mg total) by mouth 2 (two) times daily. 01/21/17 01/24/17  Tabitha Neighbors, Scott  potassium chloride SA (K-DUR,KLOR-CON) 10 MEQ tablet Take 1 tablet (10 mEq total) by mouth 2 (two) times daily. 01/21/17 01/24/17  Tabitha Neighbors, Scott    Past Medical, Surgical Family and Social History reviewed and updated.    Objective:   Today's Vitals   03/15/17 0945  BP: (!) 142/88  Pulse: 90  Resp: 14  Temp: 98.1 F (36.7 C)  TempSrc: Oral  SpO2: 95%  Weight: (!) 343 lb 9.6 oz (155.9 kg)  Height: 5\' 4"  (1.626 m)    Wt Readings from Last 3 Encounters:  03/15/17 (!) 343 lb 9.6 oz (155.9 kg)  01/25/17 (!) 332 lb (150.6 kg)  01/21/17 (!) 341 lb (154.7 kg)   Physical Exam  Constitutional: She is oriented to person, place, and time. She appears well-developed and well-nourished.  HENT:  Head: Normocephalic and atraumatic.    Eyes: Pupils are equal, round, and reactive to light. Conjunctivae and EOM are normal.  Cardiovascular: Normal rate, regular rhythm, normal heart sounds and intact distal pulses.   Pulmonary/Chest: Effort normal and breath sounds normal.  Genitourinary:  Genitourinary Comments: Normal female external genitalia without lesion. No inguinal lymphadenopathy. Vaginal mucosa is pink and moist without lesions. Cervix non friable with thicken white discharge, noted.  Pap smear obtained. No cervical motion tenderness, adnexal fullness or tenderness.  Musculoskeletal: Normal range of motion.  Neurological: She is alert and oriented to person, place, and time.  Skin: Skin is warm and dry.  Psychiatric: She has a normal mood and affect. Her behavior is normal. Judgment and thought content normal.   Assessment & Plan:  1. Chronic pain of both knees - Ambulatory referral to Orthopedic Surgery  2. OSA (obstructive sleep apnea), referral for sleep  medicine was recommended by Dr. Thurmon Fair during Ms. Men most recent cardiology appointment 01/25/2017 for evaluation of her current CPAP settings. - Ambulatory referral to Sleep Studies  3. Abnormal renal function, prior CMP indicated elevated creatinine and UA + protein. Will repeat today. - Microalbumin/Creatinine Ratio, Urine - Renal function panel  4. Well woman exam (no gynecological exam) - Cytology - PAP [place]  5. Mass of nose - Ambulatory referral to Dermatology  6. Type 2 diabetes mellitus without complication, without long-term current use of insulin (HCC) - Hemoglobin A1C-pending    RTC: 3 months for chronic disease management   Godfrey Pick. Tiburcio Pea, MSN, Scott-C [facility]  [address] [place], [place] [address] [phone]

## 2017-03-15 NOTE — Patient Instructions (Signed)
You are being referred to dermatology, orthopedics, and sleep medicine. If you have not received a call within 2 weeks regarding your referrals, please call the office.  Pap Test Why am I having this test? A pap test is sometimes called a pap smear. It is a screening test that is used to check for signs of cancer of the vagina, cervix, and uterus. The test can also identify the presence of infection or precancerous changes. Your health care provider will likely recommend you have this test done on a regular basis. This test may be done:  Every 3 years, starting at age 53.  Every 5 years, in combination with testing for the presence of human papillomavirus (HPV).  More or less often depending on other medical conditions.  What kind of sample is taken? Using a small cotton swab, plastic spatula, or brush, your health care provider will collect a sample of cells from the surface of your cervix. Your cervix is the opening to your uterus, also called a womb. Secretions from the cervix and vagina may also be collected. How do I prepare for this test?  Be aware of where you are in your menstrual cycle. You may be asked to reschedule the test if you are menstruating on the day of the test.  You may need to reschedule if you have a known vaginal infection on the day of the test.  You may be asked to avoid douching or taking a bath the day before or the day of the test.  Some medicines can cause abnormal test results, such as digitalis and tetracycline. Talk with your health care provider before your test if you take one of these medicines. What do the results mean? Abnormal test results may indicate a number of health conditions. These may include:  Cancer. Although pap test results cannot be used to diagnose cancer of the cervix, vagina, or uterus, they may suggest the possibility of cancer. Further tests would be required to determine if cancer is present.  Sexually transmitted  disease.  Fungal infection.  Parasite infection.  Herpes infection.  A condition causing or contributing to infertility.  It is your responsibility to obtain your test results. Ask the lab or department performing the test when and how you will get your results. Contact your health care provider to discuss any questions you have about your results. Talk with your health care provider to discuss your results, treatment options, and if necessary, the need for more tests. Talk with your health care provider if you have any questions about your results. This information is not intended to replace advice given to you by your health care provider. Make sure you discuss any questions you have with your health care provider. Document Released: 10/31/2002 Document Revised: 04/15/2016 Document Reviewed: 01/01/2014 Elsevier Interactive Patient Education  [employer]2018 [employer] Inc[employer]

## 2017-03-16 LAB — HEMOGLOBIN A1C
HEMOGLOBIN A1C: 5.8 % — AB (ref <5.7)
MEAN PLASMA GLUCOSE: 120 mg/dL

## 2017-03-17 ENCOUNTER — Other Ambulatory Visit: Payer: Self-pay | Admitting: Family Medicine

## 2017-03-17 LAB — CYTOLOGY - PAP
ADEQUACY: ABSENT
Candida vaginitis: NEGATIVE
Chlamydia: NEGATIVE
DIAGNOSIS: NEGATIVE
NEISSERIA GONORRHEA: NEGATIVE
Trichomonas: NEGATIVE

## 2017-03-17 LAB — MICROALBUMIN / CREATININE URINE RATIO

## 2017-03-18 LAB — CERVICOVAGINAL ANCILLARY ONLY: HERPES (WINDOWPATH): NEGATIVE

## 2017-03-22 ENCOUNTER — Ambulatory Visit (INDEPENDENT_AMBULATORY_CARE_PROVIDER_SITE_OTHER): Payer: Self-pay | Admitting: Orthopaedic Surgery

## 2017-03-22 IMAGING — MR MR KNEE*L* W/O CM
4 of 6 series · 21 of 40 positions shown · non-contrast
Comparison: 12/21/2015

CLINICAL DATA: Left medial knee pain and weakness for 3 weeks. Knee
locking.

EXAM:
MRI OF THE LEFT KNEE WITHOUT CONTRAST
TECHNIQUE: Multiplanar, multisequence MR imaging of the knee was performed. No
intravenous contrast was administered.

[Series 3: pd_tse_fs_tra · axial · 3.5mm · 0.42mm/px · z∈[+34,+105]mm · 3 of 22 slices shown]
[im 5/22]
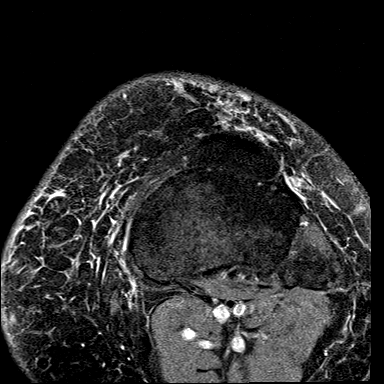
[im 13/22]
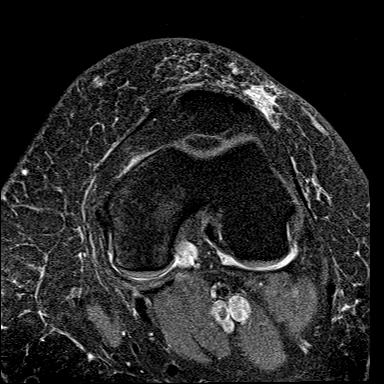
[im 22/22]
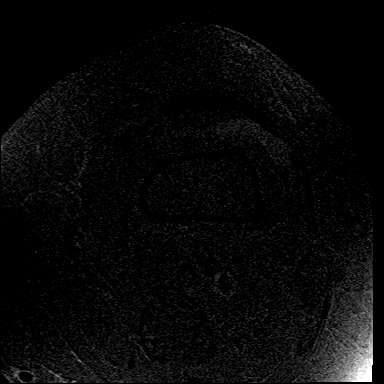

[Series 5: T1 · coronal · 3.2mm · 0.25mm/px · 3 of 26 slices shown]
[im 4/26]
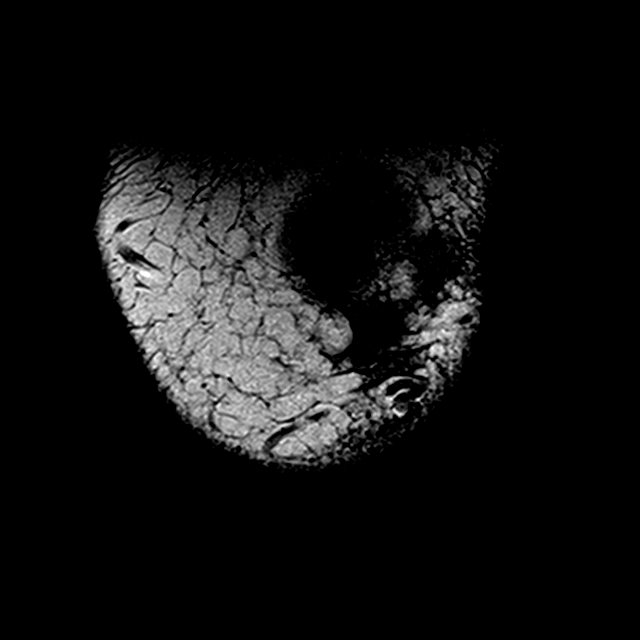
[im 15/26]
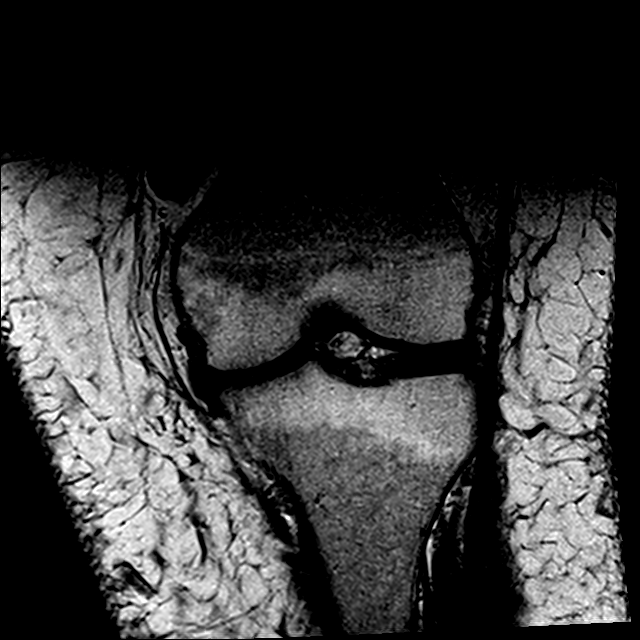
[im 22/26]
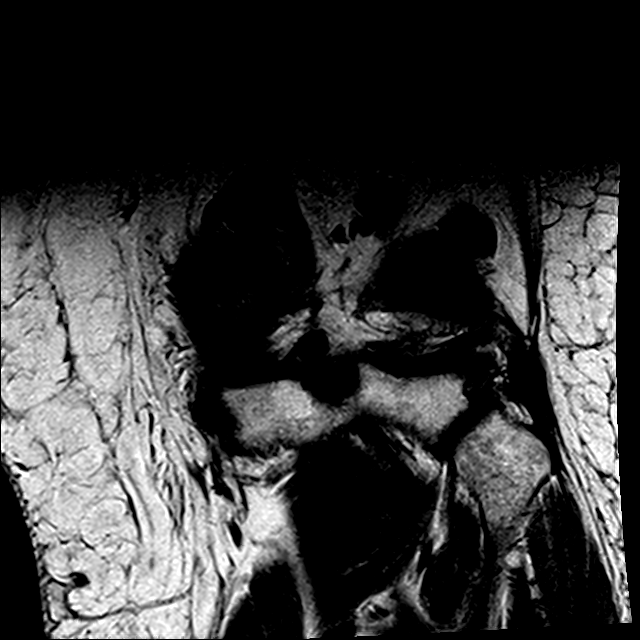

[Series 6: PD fat-sat · sagittal · 3.5mm · 0.25mm/px · 7 of 23 slices shown]
[im 1/23]
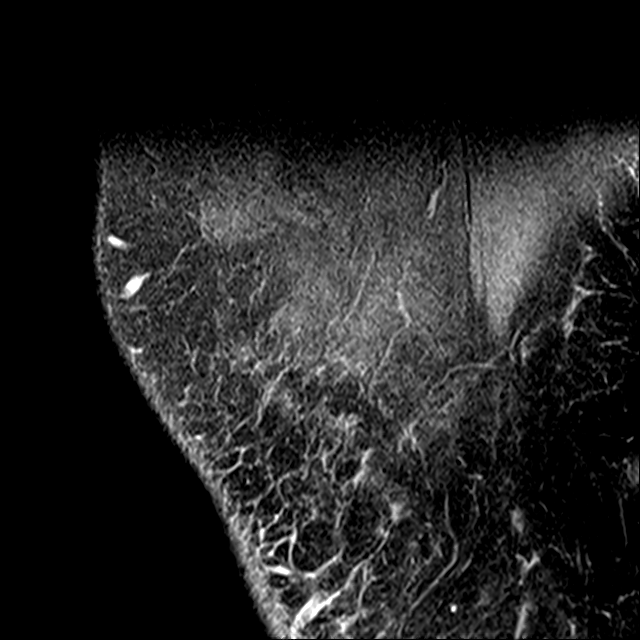
[im 4/23]
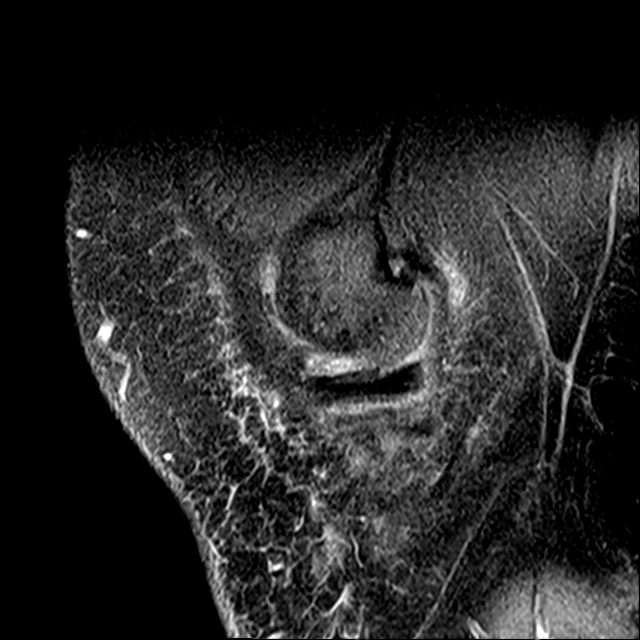
[im 8/23]
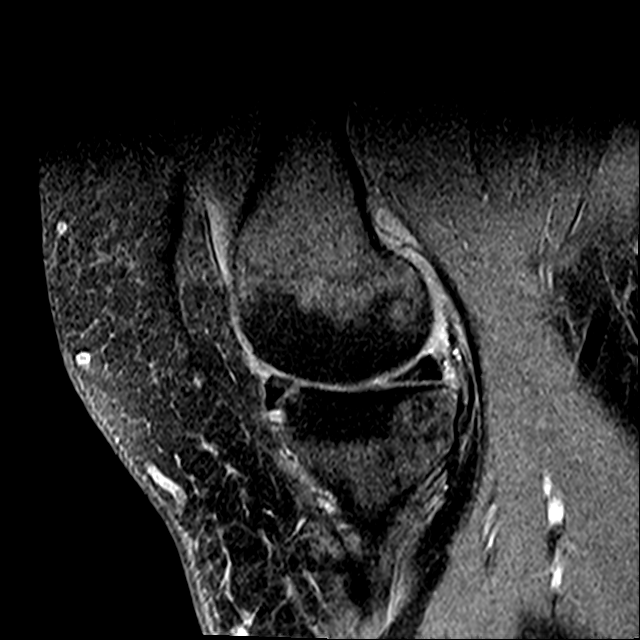
[im 12/23]
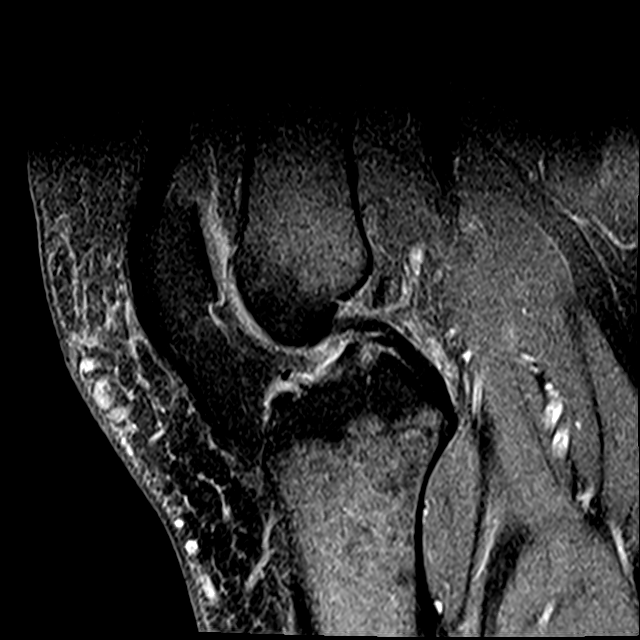
[im 15/23]
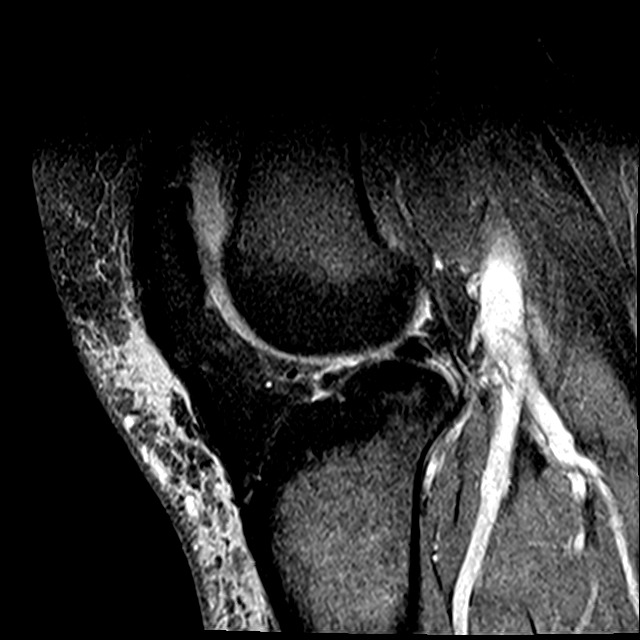
[im 19/23]
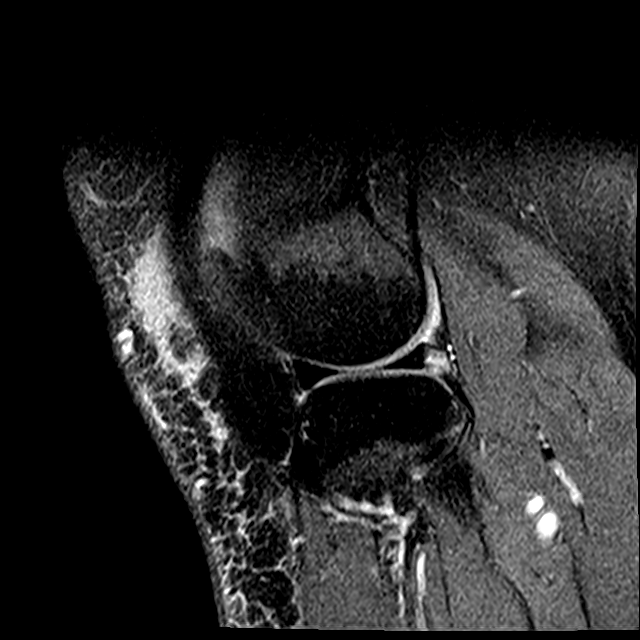
[im 23/23]
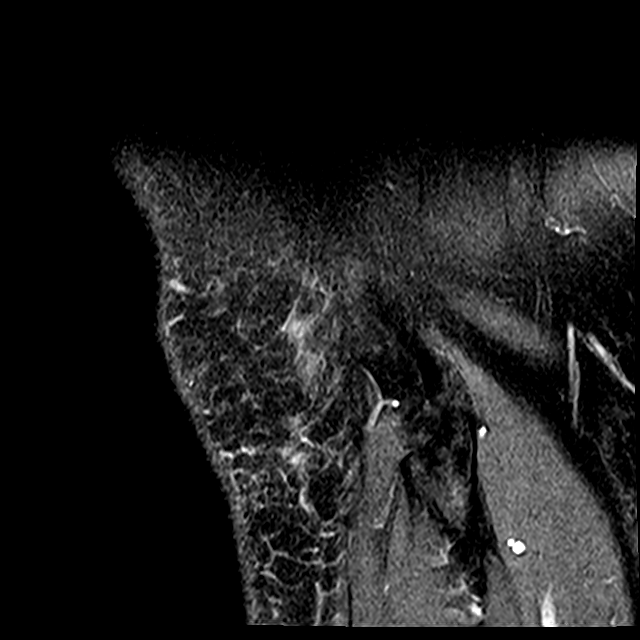

[Series 7: T2 fat-sat · coronal · 3.2mm · 0.62mm/px · 8 of 26 slices shown]
[im 1/26]
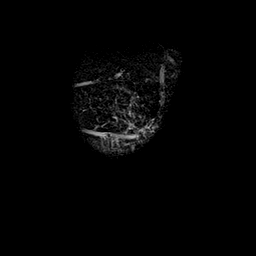
[im 4/26]
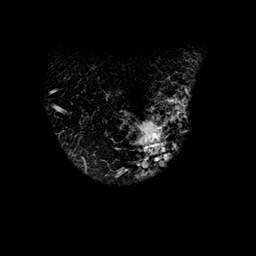
[im 8/26]
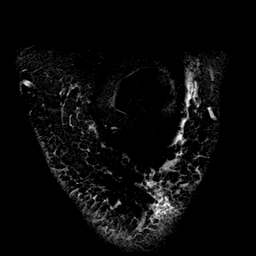
[im 11/26]
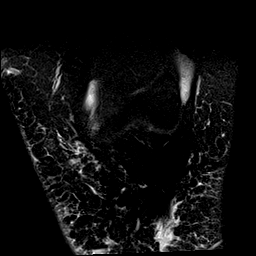
[im 15/26]
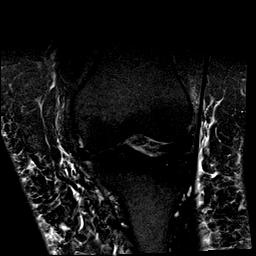
[im 18/26]
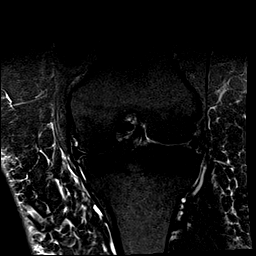
[im 22/26]
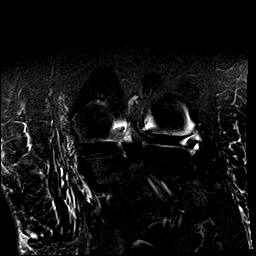
[im 26/26]
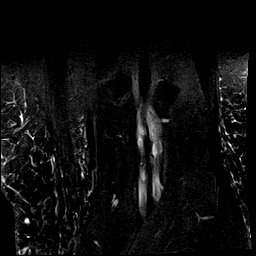

[21 of 40 positions shown; findings below may reference images not displayed]

FINDINGS: Body habitus reduces diagnostic sensitivity and specificity.

MENISCI

Medial meniscus: Minimal free edge irregularity along the posterior
horn but without a well-defined tear.

Lateral meniscus:  Unremarkable

LIGAMENTS

Cruciates:  Unremarkable

Collaterals:  Unremarkable

CARTILAGE

Patellofemoral:  Unremarkable

Medial: Moderate to prominent degenerative chondral thinning. Mild
marginal spurring.

Lateral: Generally mild chondral thinning with a 5 mm focal chondral
defect posteriorly along the articular surface lateral femoral
condyle, image [DATE].

Joint:  Small knee effusion.

Popliteal Fossa:  Unremarkable

Extensor Mechanism:  Prepatellar subcutaneous edema.

Bones: Red marrow redistribution is present. This is nonspecific can
be associated with a variety of conditions including chronic anemia,
chronic heart failure, myelofibrosis, infiltrative marrow disease,
response to infection, and other conditions.
IMPRESSION: 1. Osteoarthritis, with moderate to prominent degenerative chondral
thinning in the medial compartment and generally mild chondral
thinning in the lateral compartment. There is a 5 mm partial
thickness focal chondral defect posteriorly along the articular
surface of the lateral femoral condyle.
2. Small knee effusion.
3. Prepatellar subcutaneous edema.
4. Minimal free edge irregularity along the posterior horn medial
meniscus but without a well-defined tear.
5. Red marrow redistribution is present. This is nonspecific can be
associated with a variety of conditions including chronic anemia,
chronic heart failure, myelofibrosis, infiltrative marrow disease,
response to infection, and other conditions.

## 2017-03-24 ENCOUNTER — Encounter: Payer: Self-pay | Admitting: Family Medicine

## 2017-03-24 NOTE — Progress Notes (Signed)
Please mail letter to patient  

## 2017-03-26 ENCOUNTER — Telehealth: Payer: Self-pay | Admitting: Internal Medicine

## 2017-03-26 NOTE — Telephone Encounter (Signed)
Attempted to contact [facility]. I was placed on hold and then hung up on. When I tried to call back I received a fast busy signal. Will try back.

## 2017-03-29 ENCOUNTER — Ambulatory Visit: Payer: Medicaid Other | Admitting: Cardiovascular Disease

## 2017-03-29 NOTE — Telephone Encounter (Signed)
Attempted to call [facility]. Never was able to speak with a nurse or operator. Will call back later.

## 2017-03-30 NOTE — Telephone Encounter (Signed)
We have attempted to contact [facility]. Pt's appointment with their office has already passed. Per triage protocol, message will be closed.

## 2017-04-12 ENCOUNTER — Ambulatory Visit (INDEPENDENT_AMBULATORY_CARE_PROVIDER_SITE_OTHER): Payer: Self-pay | Admitting: Orthopaedic Surgery

## 2017-04-15 ENCOUNTER — Ambulatory Visit: Payer: Medicaid Other | Admitting: Cardiovascular Disease

## 2017-04-19 ENCOUNTER — Other Ambulatory Visit: Payer: Self-pay

## 2017-04-19 MED ORDER — OMEPRAZOLE 40 MG PO CPDR: 40.0000 mg | DELAYED_RELEASE_CAPSULE | Freq: Two times a day (BID) | ORAL | 0 refills | Status: DC

## 2017-05-11 ENCOUNTER — Other Ambulatory Visit: Payer: Self-pay | Admitting: Family Medicine

## 2017-05-20 ENCOUNTER — Telehealth: Payer: Self-pay

## 2017-05-20 NOTE — Telephone Encounter (Signed)
Patient would like another referral to [facility] as she no show a appointment and cancel one so she needs a new referral.

## 2017-05-21 NOTE — Telephone Encounter (Signed)
Advise patient that she has no showed all referrals I made for her during her last visit with me. Until I see her in office on 06/15/2017, I will be unable to place any additional referrals.

## 2017-05-24 NOTE — Telephone Encounter (Signed)
Patient notified

## 2017-06-15 ENCOUNTER — Ambulatory Visit (INDEPENDENT_AMBULATORY_CARE_PROVIDER_SITE_OTHER): Payer: Medicaid Other | Admitting: Family Medicine

## 2017-06-15 ENCOUNTER — Encounter: Payer: Self-pay | Admitting: Family Medicine

## 2017-06-15 VITALS — BP 136/74 | HR 70 | Temp 98.7°F | Resp 14 | Ht 64.0 in | Wt 332.0 lb

## 2017-06-15 DIAGNOSIS — R0989 Other specified symptoms and signs involving the circulatory and respiratory systems: Secondary | ICD-10-CM

## 2017-06-15 DIAGNOSIS — Z9989 Dependence on other enabling machines and devices: Secondary | ICD-10-CM | POA: Diagnosis not present

## 2017-06-15 DIAGNOSIS — R229 Localized swelling, mass and lump, unspecified: Secondary | ICD-10-CM

## 2017-06-15 DIAGNOSIS — G8929 Other chronic pain: Secondary | ICD-10-CM | POA: Diagnosis not present

## 2017-06-15 DIAGNOSIS — G4733 Obstructive sleep apnea (adult) (pediatric): Secondary | ICD-10-CM

## 2017-06-15 DIAGNOSIS — Z23 Encounter for immunization: Secondary | ICD-10-CM

## 2017-06-15 DIAGNOSIS — M25561 Pain in right knee: Secondary | ICD-10-CM

## 2017-06-15 DIAGNOSIS — G894 Chronic pain syndrome: Secondary | ICD-10-CM | POA: Diagnosis not present

## 2017-06-15 DIAGNOSIS — M25562 Pain in left knee: Secondary | ICD-10-CM | POA: Diagnosis not present

## 2017-06-15 DIAGNOSIS — R7303 Prediabetes: Secondary | ICD-10-CM

## 2017-06-15 LAB — POCT GLYCOSYLATED HEMOGLOBIN (HGB A1C): Hemoglobin A1C: 5.6

## 2017-06-15 MED ORDER — GUAIFENESIN ER 600 MG PO TB12: 600.0000 mg | ORAL_TABLET | Freq: Two times a day (BID) | ORAL | 3 refills | Status: DC | PRN

## 2017-06-15 NOTE — Progress Notes (Signed)
Patient ID: Tabitha Scott, female    DOB: Dec 15, 1965, 51 y.o.   MRN: [id number]  PCP: Bing Neighbors, FNP  Chief Complaint  Patient presents with  . Follow-up    3 MONTHS    Subjective:  HPI Tabitha Scott is a 51 y.o. female presents for a 3 month follow-up evaluation. Medical history significant for prediabetes,  obesity, hypothyroidism, OSA, chronic pain, and hypertension.  During Kim's last office visit, she was referred to pain management, sleep medicine, dermatology, and orthopedics and has chronicically no showed scheduled appointments. Today, she continues to complain of shortness of breath with activity and fatigue. She suffers from sleep apnea , however it is unknown of when she last had a sleep study. Reports overall consistent use of CPAP machine. Reports recently experienced persistent congestion with sputum production.  Mucus is thick and clear and she only coughs to bring the mucus up. She sleeps on two pillows at night to improve congestions with minimal relief. She has not attempted any OTC medication. Selena Batten has an enlarged skin mass on right side of her nose for which she was referred to dermatology however, office was in [place], and she was unable to make appointment. She requests a new referral to an office that accepts medicaid in [facility]. Continues to complain of bilateral chronic knee pain. Imaging taken in 2017 and 2018 were unremarkable abd negative of degenerative changes. Current Body mass index is 56.99 kg/m. she is not physically active and has not achieved any significant weight loss. Social History   Social History  . Marital status: Divorced    Spouse name: N/A  . Number of children: N/A  . Years of education: N/A   Occupational History  . Not on file.   Social History Main Topics  . Smoking status: Former Smoker    Packs/day: 1.50    Years: 41.00    Types: Cigarettes    Quit date: 02/11/2015  . Smokeless tobacco: Never Used  . Alcohol use No  .  Drug use: No  . Sexual activity: No   Other Topics Concern  . Not on file   Social History Narrative  . No narrative on file    Family History  Problem Relation Age of Onset  . COPD Mother   . Heart disease Father   . Cancer Maternal Grandmother    Review of Systems Constitutional: Negative for fever, chills, diaphoresis, activity change, appetite change and Positive for fatigue. HENT: Negative for ear pain, nosebleeds, congestion, facial swelling, rhinorrhea, neck pain, neck stiffness and ear discharge.  Eyes: Negative for pain, discharge, redness, itching and visual disturbance. Respiratory: Positive SOB and chest congestion, wheezing and stridor.  Cardiovascular: Negative for chest pain, palpitations and leg swelling. Gastrointestinal: Negative for abdominal distention. Genitourinary: Negative for dysuria, urgency, frequency, hematuria, flank pain, decreased urine volume, difficulty urinating and dyspareunia.  Musculoskeletal: Positive chronic musculoskeletal pain. Neurological: Negative for dizziness, tremors, seizures, syncope, facial asymmetry, speech difficulty, weakness, light-headedness, numbness and headaches.  Hematological: Negative for adenopathy. Does not bruise/bleed easily. Psychiatric/Behavioral: Negative for hallucinations, behavioral problems, confusion, dysphoric mood, decreased concentration and agitation.   Patient Active Problem List   Diagnosis Date Noted  . Essential hypertension 01/28/2017  . Dyspnea on exertion 12/14/2016  . Diastolic dysfunction 12/14/2016  . COPD GOLD 0  09/14/2016  . Esophageal reflux   . Pulmonary emphysema (HCC)   . Pain in the chest 04/15/2016  . Anemia 04/15/2016  . Hypokalemia 10/18/2015  . ARF (acute renal  failure) (HCC) 10/18/2015  . Diabetes mellitus type 2, controlled (HCC) 10/18/2015  . Cough   . Acute respiratory failure with hypoxia (HCC)   . OSA on CPAP   . Morbid (severe) obesity due to excess calories (HCC)    . Diabetes mellitus type 2, noninsulin dependent (HCC)   . Diabetes (HCC) 04/09/2015  . Hypothyroidism 04/09/2015  . Pedal edema 04/09/2015  . DDD (degenerative disc disease), lumbar 04/09/2015  . Pure hypercholesterolemia 04/09/2015    Allergies  Allergen Reactions  . Tetanus Toxoids Swelling    Prior to Admission medications   Medication Sig Start Date End Date Taking? Authorizing Provider  albuterol (PROVENTIL HFA;VENTOLIN HFA) 108 (90 Base) MCG/ACT inhaler Inhale 1-2 puffs into the lungs every 6 (six) hours as needed for wheezing or shortness of breath. 07/23/16  Yes Henrietta Hoover, NP  atorvastatin (LIPITOR) 40 MG tablet Take 1 tablet (40 mg total) by mouth daily. 09/23/16  Yes Henrietta Hoover, NP  DULoxetine (CYMBALTA) 30 MG capsule Take 1 capsule (30 mg total) by mouth 2 (two) times daily. Take 1 tablet once daily for 7 days, then 2 tablets daily 01/21/17  Yes Bing Neighbors, FNP  furosemide (LASIX) 40 MG tablet TAKE 1 TABLET BY MOUTH TWICE DAILY 05/11/17  Yes Bing Neighbors, FNP  gabapentin (NEURONTIN) 300 MG capsule Take 1 capsule (300 mg total) by mouth 4 (four) times daily. 01/21/17  Yes Bing Neighbors, FNP  ibuprofen (ADVIL,MOTRIN) 600 MG tablet Take 1 tablet (600 mg total) by mouth every 6 (six) hours. 12/21/16  Yes Felicie Morn, NP  levothyroxine (SYNTHROID, LEVOTHROID) 25 MCG tablet Take 1 tablet (25 mcg total) by mouth daily before breakfast. 09/23/16  Yes Henrietta Hoover, NP  omeprazole (PRILOSEC) 40 MG capsule TAKE 1 CAPSULE BY MOUTH TWICE DAILY 05/11/17  Yes Bing Neighbors, FNP  potassium chloride (K-DUR,KLOR-CON) 10 MEQ tablet TAKE 1 TABLET BY MOUTH TWICE DAILY 03/18/17  Yes Bing Neighbors, FNP  potassium chloride (K-DUR,KLOR-CON) 10 MEQ tablet TAKE 1 TABLET BY MOUTH TWICE DAILY 05/11/17  Yes Bing Neighbors, FNP  spironolactone-hydrochlorothiazide (ALDACTAZIDE) 25-25 MG tablet Take 1 tablet by mouth daily. 01/21/17  Yes Bing Neighbors, FNP   tiZANidine (ZANAFLEX) 4 MG capsule Take 1 capsule (4 mg total) by mouth 3 (three) times daily as needed for muscle spasms. 09/23/16  Yes Henrietta Hoover, NP   Past Medical, Surgical Family and Social History reviewed and updated.   Objective:   Today's Vitals   06/15/17 1013  BP: 136/74  Pulse: 70  Resp: 14  Temp: 98.7 F (37.1 C)  TempSrc: Oral  SpO2: 96%  Weight: (!) 332 lb (150.6 kg)  Height: 5\' 4"  (1.626 m)    Wt Readings from Last 3 Encounters:  06/15/17 (!) 332 lb (150.6 kg)  03/15/17 (!) 343 lb 9.6 oz (155.9 kg)  01/25/17 (!) 332 lb (150.6 kg)   Physical Exam  Constitutional: She is oriented to person, place, and time. She appears well-developed and well-nourished.  HENT:  Head: Normocephalic and atraumatic.  Eyes: Pupils are equal, round, and reactive to light. Conjunctivae and EOM are normal.  Neck: Normal range of motion. Neck supple.  Cardiovascular: Normal rate, regular rhythm, normal heart sounds and intact distal pulses.   Pulmonary/Chest: Effort normal and breath sounds normal.  Abdominal: Soft. Bowel sounds are normal.  Musculoskeletal: Normal range of motion. She exhibits no edema.       Right knee: She exhibits normal range of  motion and no swelling. Tenderness found.       Left knee: Tenderness found.  Neurological: She is alert and oriented to person, place, and time.  Skin: Skin is warm and dry.  Right inner nose large fleshed color rounded mass consistent  Psychiatric: She has a normal mood and affect. Her behavior is normal. Judgment and thought content normal.   Assessment & Plan:  1. Prediabetes, POCT glycosylated hemoglobin (Hb A1C) 5.6 is normal range and no longer meets criteria for prediabetes. 2. Chest congestion, likely secondary to COPD. Encouraged to trial Mucinex 600 mg BID, daily as needed,  to loosen secretions and increase intake of water. 3. Need for immunization against influenza- Flu Vaccine QUAD 36+ mos IM 4. Chronic pain  syndrome, patient has been denied by several practices for pain management. Will [facility] to [facility] [place]. Advise patient if they will not accept her, all resources have been exhausted. 5. Chronic pain of both knees, patient no showed prior referral to orthopedics. Will place another referral for evaluation of chronic knee pain. She is currently prescribed Gabapentin for knee pain. 6. Obstructive Sleep Apnea , reports current consistent use of home CPAP. Cardiologist recommended months ago that patient undergo a new OSA evaluation. Will order a split sleep study to re-evaluate current sleep apnea as patient is continued to complain of day sleepiness and fatigue. 7. Skin mass -on right nose, referring to dermatology to consult for removal     Orders Placed This Encounter  Procedures  . Flu Vaccine QUAD 36+ mos IM  . Ambulatory referral to Orthopedic Surgery  . Ambulatory referral to Pain Clinic  . Ambulatory referral to Dermatology  . POCT glycosylated hemoglobin (Hb A1C)  . Split night study     Meds ordered this encounter  Medications  . guaiFENesin (MUCINEX) 600 MG 12 hr tablet    Sig: Take 1 tablet (600 mg total) by mouth 2 (two) times daily as needed for to loosen phlegm.    Dispense:  60 tablet    Refill:  3    Order Specific Question:   Supervising Provider    Answer:   Quentin AngstJEGEDE, OLUGBEMIGA E [[id number]]     YNWGNFAO Z.[[id number]]     Paraskevi S. Tiburcio PeaHarris, MSN, FNP-C [facility]  [address] [place], [place] [address] [phone]

## 2017-06-15 NOTE — Patient Instructions (Addendum)
To loosen mucus, will trial Guaifenesin 600 mg twice daily with water.   I will re-issue referrals, however, it is at the discretion of the practice as to whether they will accept as patient due to prior no shows.

## 2017-07-07 ENCOUNTER — Telehealth: Payer: Self-pay

## 2017-07-07 NOTE — Telephone Encounter (Signed)
[facility] will not be taking patient at there clinic

## 2017-07-08 ENCOUNTER — Encounter (INDEPENDENT_AMBULATORY_CARE_PROVIDER_SITE_OTHER): Payer: Self-pay | Admitting: Orthopaedic Surgery

## 2017-07-08 ENCOUNTER — Ambulatory Visit (INDEPENDENT_AMBULATORY_CARE_PROVIDER_SITE_OTHER): Payer: Medicaid Other | Admitting: Orthopaedic Surgery

## 2017-07-08 ENCOUNTER — Ambulatory Visit (INDEPENDENT_AMBULATORY_CARE_PROVIDER_SITE_OTHER): Payer: Medicaid Other

## 2017-07-08 ENCOUNTER — Ambulatory Visit (INDEPENDENT_AMBULATORY_CARE_PROVIDER_SITE_OTHER): Payer: Self-pay

## 2017-07-08 VITALS — BP 136/63 | HR 100 | Resp 20 | Ht 64.5 in | Wt 342.0 lb

## 2017-07-08 DIAGNOSIS — M1712 Unilateral primary osteoarthritis, left knee: Secondary | ICD-10-CM | POA: Diagnosis not present

## 2017-07-08 DIAGNOSIS — M170 Bilateral primary osteoarthritis of knee: Secondary | ICD-10-CM | POA: Insufficient documentation

## 2017-07-08 DIAGNOSIS — M1711 Unilateral primary osteoarthritis, right knee: Secondary | ICD-10-CM

## 2017-07-08 NOTE — Progress Notes (Signed)
Office Visit Note   Patient: Tabitha Scott           Date of Birth: [birth year]           MRN: [id number] Visit Date: 07/08/2017              Requested by: Bing NeighborsHarris, Alizay S, FNP [address] [place], [place] [address] PCP: Bing NeighborsHarris, Britten S, FNP   Assessment & Plan: Visit Diagnoses:  1. Bilateral primary osteoarthritis of knee     Plan: Pain in both knees appears to be related to the mild to moderate osteoarthritis. Long discussion regarding her diagnosis and need for exercise and weight loss. BMI is approximately 58. We talked about NSAIDs and using ice and heat. Also discussed possibility of cortisone injections or Visco supplementation over time.  Follow-Up Instructions: No Follow-up on file.   Orders:  Orders Placed This Encounter  Procedures  . XR Knee Complete 4 Views Left  . XR Knee Complete 4 Views Right   No orders of the defined types were placed in this encounter.     Procedures: No procedures performed   Clinical Data: No additional findings.   Subjective: Chief Complaint  Patient presents with  . Right Knee - Pain  . Left Knee - Pain  . Knee Pain    Left knee pain x 1 year, right knee pain x 4-5 months, no injury, left knee swelling, popping, clicking, grinding noises, knees giving out, falling, right knee arthroscopic surgery  age 51, not diabetic, diffficulty walking, difficulty sleeping at night, IBU doesn't help  tried a brace but would not fit.Both knees symptomatic with achiness, soreness, stiffness. No hx fever or chills. No rashes. Uses a cane for support  HPI  Review of Systems  Constitutional: Positive for activity change.  HENT: Positive for congestion.   Eyes: Negative for pain.  Respiratory: Positive for shortness of breath.   Cardiovascular: Positive for leg swelling.  Gastrointestinal: Negative for constipation.  Endocrine: Negative for heat intolerance.  Genitourinary: Negative for difficulty urinating.  Musculoskeletal: Positive  for back pain, gait problem and joint swelling.  Skin: Negative for rash.  Allergic/Immunologic: Negative for food allergies.  Neurological: Positive for dizziness, weakness, light-headedness, numbness and headaches.  Hematological: Does not bruise/bleed easily.  Psychiatric/Behavioral: Positive for sleep disturbance.     Objective: Vital Signs: BP 136/63 (BP Location: Left Arm, Patient Position: Sitting, Cuff Size: Normal)   Pulse 100   Resp 20   Ht 5' 4.5" (1.638 m)   Wt (!) 342 lb (155.1 kg)   BMI 57.80 kg/m   Physical Exam  Ortho Exam awake alert and oriented 3. Comfortable sitting. Markedly overweight with a BMI of 58. Large knees. Full extension and 90 of flexion. No instability. Some patella crepitation. Predominantly medial joint pain bilaterally. No obvious increased varus or valgus. No popliteal pain. No calf pain. Large legs but no obvious edema. Normal sensibility. Good capillary refill to toes. Straight leg raise negative. Painless range of motion of hips  Specialty Comments:  No specialty comments available.  Imaging: No results found.   PMFS History: Patient Active Problem List   Diagnosis Date Noted  . Bilateral primary osteoarthritis of knee 07/08/2017  . Essential hypertension 01/28/2017  . Dyspnea on exertion 12/14/2016  . Diastolic dysfunction 12/14/2016  . COPD GOLD 0  09/14/2016  . Esophageal reflux   . Pulmonary emphysema (HCC)   . Pain in the chest 04/15/2016  . Anemia 04/15/2016  . Hypokalemia 10/18/2015  .  ARF (acute renal failure) (HCC) 10/18/2015  . Diabetes mellitus type 2, controlled (HCC) 10/18/2015  . Cough   . Acute respiratory failure with hypoxia (HCC)   . OSA on CPAP   . Morbid (severe) obesity due to excess calories (HCC)   . Diabetes mellitus type 2, noninsulin dependent (HCC)   . Diabetes (HCC) 04/09/2015  . Hypothyroidism 04/09/2015  . Pedal edema 04/09/2015  . DDD (degenerative disc disease), lumbar 04/09/2015  . Pure  hypercholesterolemia 04/09/2015   Past Medical History:  Diagnosis Date  . Arthritis   . Chronic left shoulder pain   . Chronic lower back pain   . COPD (chronic obstructive pulmonary disease) (HCC)   . High cholesterol   . Hyperlipidemia   . Hypothyroidism   . Neuropathy   . On home oxygen therapy    "have it available; quit smoking; not using it" (04/16/2016)  . OSA (obstructive sleep apnea)    "have mask; don't use" (04/16/2016)  . Thyroid disease   . Type II diabetes mellitus (HCC)     Family History  Problem Relation Age of Onset  . COPD Mother   . Heart disease Father   . Cancer Maternal Grandmother     Past Surgical History:  Procedure Laterality Date  . CARPAL TUNNEL RELEASE Bilateral 2009  . CHOLECYSTECTOMY    . DILATION AND CURETTAGE OF UTERUS    . JOINT REPLACEMENT Left 2015   thumb  . KNEE SURGERY Left 1970s   tissue removed  . LAPAROSCOPIC CHOLECYSTECTOMY  ~ 2014  . NEUROPLASTY / TRANSPOSITION ULNAR NERVE AT ELBOW Bilateral 2009   nerver repair to both arms   . TUBAL LIGATION     Social History   Occupational History  . Not on file  Tobacco Use  . Smoking status: Former Smoker    Packs/day: 1.50    Years: 41.00    Pack years: 61.50    Types: Cigarettes    Last attempt to quit: 02/11/2015    Years since quitting: 2.4  . Smokeless tobacco: Never Used  Substance and Sexual Activity  . Alcohol use: No    Alcohol/week: 0.0 oz  . Drug use: No  . Sexual activity: No

## 2017-07-09 NOTE — Telephone Encounter (Signed)
Letter mail sent to patient via MyChart and mailed advising that all avenues have been exhausted in obtaining a pain management clinic to accept her.  Godfrey PickKimberly S. Tiburcio PeaHarris, MSN, FNP-C [facility]  [address] [place], [place] [address] [phone]

## 2017-07-11 ENCOUNTER — Other Ambulatory Visit: Payer: Self-pay | Admitting: Family Medicine

## 2017-07-23 ENCOUNTER — Other Ambulatory Visit: Payer: Self-pay

## 2017-07-26 NOTE — Telephone Encounter (Signed)
Left a vm for patient to call back and let us know which medication she needs  

## 2017-07-28 NOTE — Telephone Encounter (Signed)
Left another vm for patient to callback about which medication she is needing refilled

## 2017-07-31 MED ORDER — TIZANIDINE HCL 4 MG PO CAPS: 4.0000 mg | ORAL_CAPSULE | Freq: Three times a day (TID) | ORAL | 0 refills | Status: DC | PRN

## 2017-07-31 MED ORDER — ALBUTEROL SULFATE HFA 108 (90 BASE) MCG/ACT IN AERS: 1.0000 | INHALATION_SPRAY | Freq: Four times a day (QID) | RESPIRATORY_TRACT | 2 refills | Status: DC | PRN

## 2017-07-31 NOTE — Telephone Encounter (Signed)
Patient is prescribed spirolactone which is potassium sparing diuretic, therefore daily potassium replacement is not warrant. Refilled albuterol and Zanaflex.  Godfrey PickKimberly S. Tiburcio PeaHarris, MSN, FNP-C [facility]  [address] [place], [place] [address] [phone]

## 2017-08-13 ENCOUNTER — Encounter (HOSPITAL_BASED_OUTPATIENT_CLINIC_OR_DEPARTMENT_OTHER): Payer: Medicaid Other

## 2017-09-30 ENCOUNTER — Other Ambulatory Visit: Payer: Self-pay | Admitting: Family Medicine

## 2017-09-30 DIAGNOSIS — G629 Polyneuropathy, unspecified: Secondary | ICD-10-CM

## 2017-11-22 ENCOUNTER — Telehealth: Payer: Self-pay

## 2017-11-22 NOTE — Telephone Encounter (Signed)
Patient given information to dermatology

## 2017-11-25 ENCOUNTER — Emergency Department (HOSPITAL_COMMUNITY)
Admission: EM | Admit: 2017-11-25 | Discharge: 2017-11-25 | Disposition: A | Discharge status: Home / Self Care | Payer: Medicaid Other | Attending: Emergency Medicine | Admitting: Emergency Medicine

## 2017-11-25 ENCOUNTER — Emergency Department (HOSPITAL_COMMUNITY): Payer: Medicaid Other

## 2017-11-25 ENCOUNTER — Encounter (HOSPITAL_COMMUNITY): Payer: Self-pay | Admitting: *Deleted

## 2017-11-25 ENCOUNTER — Other Ambulatory Visit: Payer: Self-pay

## 2017-11-25 DIAGNOSIS — I10 Essential (primary) hypertension: Secondary | ICD-10-CM | POA: Diagnosis not present

## 2017-11-25 DIAGNOSIS — E039 Hypothyroidism, unspecified: Secondary | ICD-10-CM | POA: Insufficient documentation

## 2017-11-25 DIAGNOSIS — H04301 Unspecified dacryocystitis of right lacrimal passage: Secondary | ICD-10-CM

## 2017-11-25 DIAGNOSIS — Z87891 Personal history of nicotine dependence: Secondary | ICD-10-CM | POA: Insufficient documentation

## 2017-11-25 DIAGNOSIS — Z79899 Other long term (current) drug therapy: Secondary | ICD-10-CM | POA: Insufficient documentation

## 2017-11-25 DIAGNOSIS — E119 Type 2 diabetes mellitus without complications: Secondary | ICD-10-CM | POA: Insufficient documentation

## 2017-11-25 DIAGNOSIS — J449 Chronic obstructive pulmonary disease, unspecified: Secondary | ICD-10-CM | POA: Diagnosis not present

## 2017-11-25 DIAGNOSIS — H02843 Edema of right eye, unspecified eyelid: Secondary | ICD-10-CM | POA: Diagnosis present

## 2017-11-25 LAB — BASIC METABOLIC PANEL
Anion gap: 9 (ref 5–15)
BUN: 7 mg/dL (ref 6–20)
CALCIUM: 8.7 mg/dL — AB (ref 8.9–10.3)
CO2: 33 mmol/L — ABNORMAL HIGH (ref 22–32)
CREATININE: 1.15 mg/dL — AB (ref 0.44–1.00)
Chloride: 97 mmol/L — ABNORMAL LOW (ref 101–111)
GFR calc Af Amer: >60 mL/min (ref >60)
GFR, EST NON AFRICAN AMERICAN: 53 mL/min — AB (ref >60)
GLUCOSE: 91 mg/dL (ref 65–99)
Potassium: 3.3 mmol/L — ABNORMAL LOW (ref 3.5–5.1)
Sodium: 139 mmol/L (ref 135–145)

## 2017-11-25 LAB — CBC WITH DIFFERENTIAL/PLATELET
BASOS ABS: 0 10*3/uL (ref 0.0–0.1)
BASOS PCT: 0 %
EOS ABS: 0.2 10*3/uL (ref 0.0–0.7)
EOS PCT: 2 %
HCT: 38.1 % (ref 36.0–46.0)
Hemoglobin: 11.5 g/dL — ABNORMAL LOW (ref 12.0–15.0)
Lymphocytes Relative: 36 %
Lymphs Abs: 3.2 10*3/uL (ref 0.7–4.0)
MCH: 27 pg (ref 26.0–34.0)
MCHC: 30.2 g/dL (ref 30.0–36.0)
MCV: 89.4 fL (ref 78.0–100.0)
MONOS PCT: 4 %
Monocytes Absolute: 0.4 10*3/uL (ref 0.1–1.0)
Neutro Abs: 5.1 10*3/uL (ref 1.7–7.7)
Neutrophils Relative %: 58 %
PLATELETS: 208 10*3/uL (ref 150–400)
RBC: 4.26 MIL/uL (ref 3.87–5.11)
RDW: 18 % — AB (ref 11.5–15.5)
WBC: 8.8 10*3/uL (ref 4.0–10.5)

## 2017-11-25 MED ORDER — FENTANYL CITRATE (PF) 100 MCG/2ML IJ SOLN
50.0000 ug | Freq: Once | INTRAMUSCULAR | Status: AC
  Administered 2017-11-25: 50 ug via INTRAVENOUS
  Filled 2017-11-25: qty 2

## 2017-11-25 MED ORDER — IOPAMIDOL (ISOVUE-300) INJECTION 61%: 100.0000 mL | Freq: Once | INTRAVENOUS | Status: DC | PRN

## 2017-11-25 MED ORDER — CLINDAMYCIN PHOSPHATE 600 MG/50ML IV SOLN
600.0000 mg | Freq: Once | INTRAVENOUS | Status: AC
  Administered 2017-11-25: 600 mg via INTRAVENOUS
  Filled 2017-11-25: qty 50

## 2017-11-25 MED ORDER — IOPAMIDOL (ISOVUE-300) INJECTION 61%
INTRAVENOUS | Status: AC
  Administered 2017-11-25: 100 mL
  Filled 2017-11-25: qty 100

## 2017-11-25 MED ORDER — CLINDAMYCIN HCL 300 MG PO CAPS: 300.0000 mg | ORAL_CAPSULE | Freq: Four times a day (QID) | ORAL | 0 refills | Status: DC

## 2017-11-25 MED ORDER — HYDROCODONE-ACETAMINOPHEN 5-325 MG PO TABS: 1.0000 | ORAL_TABLET | Freq: Four times a day (QID) | ORAL | 0 refills | Status: DC | PRN

## 2017-11-25 NOTE — ED Provider Notes (Signed)
[facility] EMERGENCY DEPARTMENT Provider Note   CSN: [id number] Arrival date & time: 11/25/17  1004     History   Chief Complaint Chief Complaint  Patient presents with  . Eye Problem    HPI Tabitha Scott is a 52 y.o. female.  Patient is a 52 year old female who presents with swelling around her right eye.  She states she has had a small bump there for about a year at the corner of her right eye.  She states over the last month is gotten bigger but over the last 4 days is gotten painful and erythematous.  Now she has redness surrounding her eye.  She states it hurts to move her eye.  She has blurry vision in her right eye.  She denies any known fevers.  No history of similar symptoms in the past.  She wears glasses but not contacts.  She has an appointment with an optometrist next week.  She cannot tolerate a tetanus vaccine due to allergy.     Past Medical History:  Diagnosis Date  . Arthritis   . Chronic left shoulder pain   . Chronic lower back pain   . COPD (chronic obstructive pulmonary disease) (HCC)   . High cholesterol   . Hyperlipidemia   . Hypothyroidism   . Neuropathy   . On home oxygen therapy    "have it available; quit smoking; not using it" (04/16/2016)  . OSA (obstructive sleep apnea)    "have mask; don't use" (04/16/2016)  . Thyroid disease   . Type II diabetes mellitus [facility])     Patient Active Problem List   Diagnosis Date Noted  . Bilateral primary osteoarthritis of knee 07/08/2017  . Essential hypertension 01/28/2017  . Dyspnea on exertion 12/14/2016  . Diastolic dysfunction 12/14/2016  . COPD GOLD 0  09/14/2016  . Esophageal reflux   . Pulmonary emphysema (HCC)   . Pain in the chest 04/15/2016  . Anemia 04/15/2016  . Hypokalemia 10/18/2015  . ARF (acute renal failure) (HCC) 10/18/2015  . Diabetes mellitus type 2, controlled (HCC) 10/18/2015  . Cough   . Acute respiratory failure with hypoxia (HCC)   . OSA on CPAP   . Morbid  (severe) obesity due to excess calories (HCC)   . Diabetes mellitus type 2, noninsulin dependent (HCC)   . Diabetes (HCC) 04/09/2015  . Hypothyroidism 04/09/2015  . Pedal edema 04/09/2015  . DDD (degenerative disc disease), lumbar 04/09/2015  . Pure hypercholesterolemia 04/09/2015    Past Surgical History:  Procedure Laterality Date  . CARPAL TUNNEL RELEASE Bilateral 2009  . CHOLECYSTECTOMY    . DILATION AND CURETTAGE OF UTERUS    . JOINT REPLACEMENT Left 2015   thumb  . KNEE SURGERY Left 1970s   tissue removed  . LAPAROSCOPIC CHOLECYSTECTOMY  ~ 2014  . NEUROPLASTY / TRANSPOSITION ULNAR NERVE AT ELBOW Bilateral 2009   nerver repair to both arms   . TUBAL LIGATION       OB History   None      Home Medications    Prior to Admission medications   Medication Sig Start Date End Date Taking? Authorizing Provider  albuterol (PROVENTIL HFA;VENTOLIN HFA) 108 (90 Base) MCG/ACT inhaler Inhale 1-2 puffs into the lungs every 6 (six) hours as needed for wheezing or shortness of breath. 07/31/17   Bing Neighbors, FNP  atorvastatin (LIPITOR) 40 MG tablet Take 1 tablet (40 mg total) by mouth daily. 09/23/16   Henrietta Hoover, NP  clindamycin (CLEOCIN) 300 MG capsule Take 1 capsule (300 mg total) by mouth 4 (four) times daily. X 7 days 11/25/17   Rolan Bucco, MD  DULoxetine (CYMBALTA) 30 MG capsule Take 1 capsule (30 mg total) by mouth 2 (two) times daily. Take 1 tablet once daily for 7 days, then 2 tablets daily 01/21/17   Bing Neighbors, FNP  DULoxetine (CYMBALTA) 30 MG capsule TAKE 1 CAPSULE BY MOUTH TWICE DAILY 07/15/17   Bing Neighbors, FNP  furosemide (LASIX) 20 MG tablet Take 1 tablet (20 mg total) by mouth 2 (two) times daily. 07/15/17   Bing Neighbors, FNP  gabapentin (NEURONTIN) 300 MG capsule TAKE 1 CAPSULE BY MOUTH 4 TIMES DAILY 10/01/17   Bing Neighbors, FNP  guaiFENesin (MUCINEX) 600 MG 12 hr tablet Take 1 tablet (600 mg total) by mouth 2 (two) times daily as  needed for to loosen phlegm. 06/15/17   Bing Neighbors, FNP  HYDROcodone-acetaminophen (NORCO/VICODIN) 5-325 MG tablet Take 1-2 tablets by mouth every 6 (six) hours as needed. 11/25/17   Rolan Bucco, MD  ibuprofen (ADVIL,MOTRIN) 600 MG tablet Take 1 tablet (600 mg total) by mouth every 6 (six) hours. 12/21/16   Felicie Morn, NP  levothyroxine (SYNTHROID, LEVOTHROID) 25 MCG tablet Take 1 tablet (25 mcg total) by mouth daily before breakfast. 09/23/16   Henrietta Hoover, NP  omeprazole (PRILOSEC) 40 MG capsule TAKE 1 CAPSULE BY MOUTH TWICE DAILY 07/15/17   Bing Neighbors, FNP  potassium chloride (K-DUR,KLOR-CON) 10 MEQ tablet TAKE 1 TABLET BY MOUTH TWICE DAILY 05/11/17   Bing Neighbors, FNP  spironolactone-hydrochlorothiazide (ALDACTAZIDE) 25-25 MG tablet Take 1 tablet by mouth daily. 01/21/17   Bing Neighbors, FNP  tiZANidine (ZANAFLEX) 4 MG capsule Take 1 capsule (4 mg total) by mouth 3 (three) times daily as needed for muscle spasms. 07/31/17   Bing Neighbors, FNP    Family History Family History  Problem Relation Age of Onset  . COPD Mother   . Heart disease Father   . Cancer Maternal Grandmother     Social History Social History   Tobacco Use  . Smoking status: Former Smoker    Packs/day: 1.50    Years: 41.00    Pack years: 61.50    Types: Cigarettes    Last attempt to quit: 02/11/2015    Years since quitting: 2.7  . Smokeless tobacco: Never Used  Substance Use Topics  . Alcohol use: No    Alcohol/week: 0.0 oz  . Drug use: No     Allergies   Tetanus toxoids and Tetanus toxoid, adsorbed   Review of Systems Review of Systems  Constitutional: Negative for chills, diaphoresis, fatigue and fever.  HENT: Negative for congestion, rhinorrhea and sneezing.   Eyes: Positive for pain, redness and visual disturbance.  Respiratory: Negative for cough, chest tightness and shortness of breath.   Cardiovascular: Negative for chest pain and leg swelling.    Gastrointestinal: Negative for abdominal pain, blood in stool, diarrhea, nausea and vomiting.  Genitourinary: Negative for difficulty urinating, flank pain, frequency and hematuria.  Musculoskeletal: Negative for arthralgias and back pain.  Skin: Negative for rash.  Neurological: Negative for dizziness, speech difficulty, weakness, numbness and headaches.     Physical Exam Updated Vital Signs BP 121/63 (BP Location: Left Wrist)   Pulse 83   Temp 99.5 F (37.5 C) (Oral)   Resp 18   SpO2 95%   Physical Exam  Constitutional: She is oriented to person, place,  and time. She appears well-developed and well-nourished.  HENT:  Head: Normocephalic and atraumatic.  Eyes: Pupils are equal, round, and reactive to light.  Patient has a 1 cm fluctuant abscess at the nasal corner of the right eye.  There is a small amount of purulent drainage.  She has erythema surrounding the right eye.  There is pain with extraocular eye movements but there is no proptosis.  Mild erythema to the conjunctivae.  Neck: Normal range of motion. Neck supple.  Cardiovascular: Normal rate, regular rhythm and normal heart sounds.  Pulmonary/Chest: Effort normal and breath sounds normal. No respiratory distress. She has no wheezes. She has no rales. She exhibits no tenderness.  Abdominal: Soft. Bowel sounds are normal. There is no tenderness. There is no rebound and no guarding.  Musculoskeletal: Normal range of motion. She exhibits no edema.  Lymphadenopathy:    She has no cervical adenopathy.  Neurological: She is alert and oriented to person, place, and time.  Skin: Skin is warm and dry. No rash noted.  Psychiatric: She has a normal mood and affect.       ED Treatments / Results  Labs (all labs ordered are listed, but only abnormal results are displayed) Labs Reviewed  BASIC METABOLIC PANEL - Abnormal; Notable for the following components:      Result Value   Potassium 3.3 (*)    Chloride 97 (*)    CO2 33  (*)    Creatinine, Ser 1.15 (*)    Calcium 8.7 (*)    GFR calc non Af Amer 53 (*)    All other components within normal limits  CBC WITH DIFFERENTIAL/PLATELET - Abnormal; Notable for the following components:   Hemoglobin 11.5 (*)    RDW 18.0 (*)    All other components within normal limits    EKG None  Radiology Ct Orbits W Contrast  Result Date: 11/25/2017 CLINICAL DATA:  RIGHT periorbital abscess, pressure and blurry vision. Headache. EXAM: CT ORBITS WITH CONTRAST TECHNIQUE: Multidetector CT images was performed according to the standard protocol following intravenous contrast administration. CONTRAST:  100mL ISOVUE-300 IOPAMIDOL (ISOVUE-300) INJECTION 61% COMPARISON:  None. FINDINGS: ORBITS: Intact ocular globes. Lenses are located. Normal appearance of the optic nerve sheath complexes. Preservation of the orbital fat. Normal appearance of the extraocular muscles which are well located. Superior ophthalmic veins are not enlarged. SINUSES: Paranasal sinus are well aerated. Trace LEFT mastoid effusion. INTRACRANIAL CONTENTS: Normal. Cavum septum pellucidum is a normal variant. LEFT inferior basal ganglia perivascular space. OSSEOUS STRUCTURES/ SOFT TISSUES: 11 x 11 x 16 mm nodule (85 Hounsfield units) with faint capsular enhancement RIGHT medial epicanthus soft tissues, anterior to the RIGHT nasal lacrimal duct. Minimal surrounding skin thickening without subcutaneous gas or radiopaque foreign bodies. No destructive bony lesions. Patient is edentulous. IMPRESSION: 11 x 11 x 16 mm complex nodule RIGHT medial epicanthus, abscess versus dacryocystocele; skin neoplasm not excluded. Recommend histopathologic correlation. No postseptal involvement. Electronically Signed   By: Awilda Metroourtnay  Bloomer M.D.   On: 11/25/2017 19:36    Procedures Procedures (including critical care time)  Medications Ordered in ED Medications  iopamidol (ISOVUE-300) 61 % injection 100 mL (has no administration in time range)   fentaNYL (SUBLIMAZE) injection 50 mcg (50 mcg Intravenous Given 11/25/17 1736)  clindamycin (CLEOCIN) IVPB 600 mg (600 mg Intravenous New Bag/Given 11/25/17 1934)  iopamidol (ISOVUE-300) 61 % injection (100 mLs  Contrast Given 11/25/17 1854)     Initial Impression / Assessment and Plan / ED Course  I  have reviewed the triage vital signs and the nursing notes.  Pertinent labs & imaging results that were available during my care of the patient were reviewed by me and considered in my medical decision making (see chart for details).     Patient presents with an infection in the right eye, likely at the tear duct.  CT scan shows evidence of a fluid collection which is complex and loculated but no deeper tissue infection.  Patient has no fever and does not appear to be systemically ill.  She was given a dose of IV clindamycin's.  I spoke with Dr. Sherryll Burger with ophthalmology who recommends a trial of outpatient antibiotics and he will see the patient tomorrow in follow-up for further treatment recommendations.  This was explained to the patient and she is okay with this plan.  She will follow-up with Dr. Sherryll Burger between 830 and 11 tomorrow morning.  Final Clinical Impressions(s) / ED Diagnoses   Final diagnoses:  Dacrocystitis, right    ED Discharge Orders        Ordered    clindamycin (CLEOCIN) 300 MG capsule  4 times daily     11/25/17 2037    HYDROcodone-acetaminophen (NORCO/VICODIN) 5-325 MG tablet  Every 6 hours PRN     11/25/17 2037       Rolan Bucco, MD 11/25/17 2041

## 2017-11-25 NOTE — ED Triage Notes (Signed)
Pt in with an abscess to her right eye, swelling and redness all around eye, denies fever, pt reports increased pressure and blurred vision to that eye

## 2017-12-08 ENCOUNTER — Encounter (HOSPITAL_COMMUNITY): Payer: Self-pay | Admitting: *Deleted

## 2017-12-08 ENCOUNTER — Other Ambulatory Visit: Payer: Self-pay

## 2017-12-08 NOTE — Progress Notes (Signed)
Spoke with pt for pre-op call. Pt states she does not have a cardiac history. She states she is no longer on HTN meds and is not a diabetic. She states she was once treated for diabetes, but has been told she never was diabetic. Last A1C was 5.6 on 06/15/17.

## 2017-12-08 NOTE — H&P (Signed)
Subjective:    Tabitha Scott is a 52 y.o. female who presents for evaluation of right sided dacryocystitis . The pain is described as 7/10. Onset was several weeks ago. Symptoms have been unchanged since.   Review of Systems Pertinent items are noted in HPI.    Objective:   There were no vitals taken for this visit.  General:  alert, cooperative and appears stated age Skin:  normal Eyes: conjunctivae/corneas clear. PERRL, EOM's intact. Fundi benign. Right swelling over the nasolacrimal Sac  Mouth: MMM no lesions Lymph Nodes:  Cervical, supraclavicular, and axillary nodes normal. Lungs:  clear to auscultation bilaterally Heart:  regular rate and rhythm, S1, S2 normal, no murmur, click, rub or gallop Abdomen: soft, non-tender; bowel sounds normal; no masses,  no organomegaly CVA:  absent Genitourinary: defer exam Extremities:  extremities normal, atraumatic, no cyanosis or edema Neurologic:  negative Psychiatric:  normal mood, behavior, speech, dress, and thought processes    Assessment: Dacryocystitis Right Side    Plan: Dacryocystorhinostomy with Anterior Ethmoidectomy with Punctoplasty, Probing, Dilation and Stent Placement Right Side.   1. Discussed the risk of surgery,  and the risks of general anesthetic including MI, CVA, sudden death or even reaction to anesthetic medications. The patient understands the risks, any and all questions were answered to the patient's satisfaction. 2. Follow up: 1 week.  Date of Surgery Update (To be completed by Attending Surgeon day of surgery.)

## 2017-12-09 ENCOUNTER — Encounter (HOSPITAL_COMMUNITY)
Admission: RE | Disposition: A | Discharge status: Home / Self Care | Payer: Self-pay | Source: Ambulatory Visit | Attending: Oculoplastics Ophthalmology

## 2017-12-09 ENCOUNTER — Ambulatory Visit (HOSPITAL_COMMUNITY): Payer: Medicaid Other | Admitting: Anesthesiology

## 2017-12-09 ENCOUNTER — Encounter (HOSPITAL_COMMUNITY): Payer: Self-pay

## 2017-12-09 ENCOUNTER — Ambulatory Visit (HOSPITAL_COMMUNITY)
Admission: RE | Admit: 2017-12-09 | Discharge: 2017-12-09 | Disposition: A | Discharge status: Home / Self Care | Payer: Medicaid Other | Source: Ambulatory Visit | Attending: Oculoplastics Ophthalmology | Admitting: Oculoplastics Ophthalmology

## 2017-12-09 DIAGNOSIS — Z87891 Personal history of nicotine dependence: Secondary | ICD-10-CM | POA: Insufficient documentation

## 2017-12-09 DIAGNOSIS — E039 Hypothyroidism, unspecified: Secondary | ICD-10-CM | POA: Diagnosis not present

## 2017-12-09 DIAGNOSIS — Z79899 Other long term (current) drug therapy: Secondary | ICD-10-CM | POA: Insufficient documentation

## 2017-12-09 DIAGNOSIS — E119 Type 2 diabetes mellitus without complications: Secondary | ICD-10-CM | POA: Insufficient documentation

## 2017-12-09 DIAGNOSIS — H04411 Chronic dacryocystitis of right lacrimal passage: Secondary | ICD-10-CM | POA: Insufficient documentation

## 2017-12-09 DIAGNOSIS — Z79890 Hormone replacement therapy (postmenopausal): Secondary | ICD-10-CM | POA: Diagnosis not present

## 2017-12-09 DIAGNOSIS — Z791 Long term (current) use of non-steroidal anti-inflammatories (NSAID): Secondary | ICD-10-CM | POA: Diagnosis not present

## 2017-12-09 DIAGNOSIS — Z6841 Body Mass Index (BMI) 40.0 and over, adult: Secondary | ICD-10-CM | POA: Insufficient documentation

## 2017-12-09 DIAGNOSIS — F329 Major depressive disorder, single episode, unspecified: Secondary | ICD-10-CM | POA: Diagnosis not present

## 2017-12-09 DIAGNOSIS — K219 Gastro-esophageal reflux disease without esophagitis: Secondary | ICD-10-CM | POA: Insufficient documentation

## 2017-12-09 DIAGNOSIS — I10 Essential (primary) hypertension: Secondary | ICD-10-CM | POA: Diagnosis not present

## 2017-12-09 DIAGNOSIS — J449 Chronic obstructive pulmonary disease, unspecified: Secondary | ICD-10-CM | POA: Insufficient documentation

## 2017-12-09 DIAGNOSIS — G4730 Sleep apnea, unspecified: Secondary | ICD-10-CM | POA: Insufficient documentation

## 2017-12-09 HISTORY — DX: Iron deficiency: E61.1

## 2017-12-09 HISTORY — PX: LACRIMAL DUCT EXPLORATION: SHX6569

## 2017-12-09 HISTORY — DX: Gastro-esophageal reflux disease without esophagitis: K21.9

## 2017-12-09 HISTORY — DX: Depression, unspecified: F32.A

## 2017-12-09 HISTORY — DX: Major depressive disorder, single episode, unspecified: F32.9

## 2017-12-09 HISTORY — DX: Essential (primary) hypertension: I10

## 2017-12-09 LAB — BASIC METABOLIC PANEL
Anion gap: 9 (ref 5–15)
BUN: 14 mg/dL (ref 6–20)
CO2: 31 mmol/L (ref 22–32)
CREATININE: 1.2 mg/dL — AB (ref 0.44–1.00)
Calcium: 9 mg/dL (ref 8.9–10.3)
Chloride: 98 mmol/L — ABNORMAL LOW (ref 101–111)
GFR calc Af Amer: 59 mL/min — ABNORMAL LOW (ref >60)
GFR calc non Af Amer: 51 mL/min — ABNORMAL LOW (ref >60)
Glucose, Bld: 131 mg/dL — ABNORMAL HIGH (ref 65–99)
Potassium: 3.8 mmol/L (ref 3.5–5.1)
SODIUM: 138 mmol/L (ref 135–145)

## 2017-12-09 LAB — CBC
HCT: 38.3 % (ref 36.0–46.0)
Hemoglobin: 11.5 g/dL — ABNORMAL LOW (ref 12.0–15.0)
MCH: 26.5 pg (ref 26.0–34.0)
MCHC: 30 g/dL (ref 30.0–36.0)
MCV: 88.2 fL (ref 78.0–100.0)
PLATELETS: 220 10*3/uL (ref 150–400)
RBC: 4.34 MIL/uL (ref 3.87–5.11)
RDW: 17.5 % — ABNORMAL HIGH (ref 11.5–15.5)
WBC: 7.6 10*3/uL (ref 4.0–10.5)

## 2017-12-09 LAB — PROTIME-INR
INR: 1.01
PROTHROMBIN TIME: 13.2 s (ref 11.4–15.2)

## 2017-12-09 SURGERY — EXPLORATION, LACRIMAL DUCT
Anesthesia: General | Site: Eye | Laterality: Right

## 2017-12-09 MED ORDER — ALBUTEROL SULFATE HFA 108 (90 BASE) MCG/ACT IN AERS
INHALATION_SPRAY | RESPIRATORY_TRACT | Status: DC | PRN
  Administered 2017-12-09 (×2): 4 via RESPIRATORY_TRACT

## 2017-12-09 MED ORDER — 0.9 % SODIUM CHLORIDE (POUR BTL) OPTIME
TOPICAL | Status: DC | PRN
  Administered 2017-12-09: 200 mL

## 2017-12-09 MED ORDER — HEMOSTATIC AGENTS (NO CHARGE) OPTIME
TOPICAL | Status: DC | PRN
  Administered 2017-12-09: 1 via TOPICAL

## 2017-12-09 MED ORDER — PROPOFOL 10 MG/ML IV BOLUS
INTRAVENOUS | Status: DC | PRN
  Administered 2017-12-09: 110 mg via INTRAVENOUS
  Administered 2017-12-09: 40 mg via INTRAVENOUS

## 2017-12-09 MED ORDER — OXYCODONE HCL 5 MG/5ML PO SOLN: 5.0000 mg | Freq: Once | ORAL | Status: DC | PRN

## 2017-12-09 MED ORDER — SUGAMMADEX SODIUM 500 MG/5ML IV SOLN
INTRAVENOUS | Status: DC | PRN
Start: 2017-12-09 — End: 2017-12-09
  Administered 2017-12-09: 500 mg via INTRAVENOUS

## 2017-12-09 MED ORDER — TOBRAMYCIN-DEXAMETHASONE 0.3-0.1 % OP OINT
TOPICAL_OINTMENT | OPHTHALMIC | Status: AC
  Filled 2017-12-09: qty 3.5

## 2017-12-09 MED ORDER — PROPOFOL 10 MG/ML IV BOLUS
INTRAVENOUS | Status: AC
  Filled 2017-12-09: qty 40

## 2017-12-09 MED ORDER — LIDOCAINE HCL (PF) 1 % IJ SOLN
INTRAMUSCULAR | Status: AC
  Filled 2017-12-09: qty 30

## 2017-12-09 MED ORDER — ROCURONIUM BROMIDE 100 MG/10ML IV SOLN
INTRAVENOUS | Status: DC | PRN
  Administered 2017-12-09: 50 mg via INTRAVENOUS

## 2017-12-09 MED ORDER — BSS IO SOLN
INTRAOCULAR | Status: AC
  Filled 2017-12-09: qty 15

## 2017-12-09 MED ORDER — CEFAZOLIN SODIUM-DEXTROSE 1-4 GM/50ML-% IV SOLN
INTRAVENOUS | Status: DC | PRN
  Administered 2017-12-09: 1 g via INTRAVENOUS

## 2017-12-09 MED ORDER — STERILE WATER FOR IRRIGATION IR SOLN
Status: DC | PRN
  Administered 2017-12-09: 200 mL

## 2017-12-09 MED ORDER — ROCURONIUM BROMIDE 10 MG/ML (PF) SYRINGE
PREFILLED_SYRINGE | INTRAVENOUS | Status: AC
  Filled 2017-12-09: qty 5

## 2017-12-09 MED ORDER — FENTANYL CITRATE (PF) 100 MCG/2ML IJ SOLN
25.0000 ug | INTRAMUSCULAR | Status: DC | PRN
  Administered 2017-12-09: 50 ug via INTRAVENOUS

## 2017-12-09 MED ORDER — BUPIVACAINE-EPINEPHRINE 0.5% -1:200000 IJ SOLN
INTRAMUSCULAR | Status: DC | PRN
  Administered 2017-12-09: 30 mL

## 2017-12-09 MED ORDER — FENTANYL CITRATE (PF) 100 MCG/2ML IJ SOLN
INTRAMUSCULAR | Status: AC
  Filled 2017-12-09: qty 2

## 2017-12-09 MED ORDER — DEXAMETHASONE SODIUM PHOSPHATE 10 MG/ML IJ SOLN
INTRAMUSCULAR | Status: DC | PRN
  Administered 2017-12-09: 10 mg via INTRAVENOUS

## 2017-12-09 MED ORDER — MIDAZOLAM HCL 5 MG/5ML IJ SOLN
INTRAMUSCULAR | Status: DC | PRN
  Administered 2017-12-09: 2 mg via INTRAVENOUS

## 2017-12-09 MED ORDER — FENTANYL CITRATE (PF) 250 MCG/5ML IJ SOLN
INTRAMUSCULAR | Status: AC
  Filled 2017-12-09: qty 5

## 2017-12-09 MED ORDER — OXYMETAZOLINE HCL 0.05 % NA SOLN
NASAL | Status: AC
  Filled 2017-12-09: qty 15

## 2017-12-09 MED ORDER — LACTATED RINGERS IV SOLN
INTRAVENOUS | Status: DC | PRN
  Administered 2017-12-09: 07:00:00 via INTRAVENOUS

## 2017-12-09 MED ORDER — DEXAMETHASONE SODIUM PHOSPHATE 10 MG/ML IJ SOLN
INTRAMUSCULAR | Status: AC
  Filled 2017-12-09: qty 1

## 2017-12-09 MED ORDER — TETRACAINE HCL 0.5 % OP SOLN
OPHTHALMIC | Status: AC
  Filled 2017-12-09: qty 4

## 2017-12-09 MED ORDER — OXYCODONE HCL 5 MG PO TABS
5.0000 mg | ORAL_TABLET | Freq: Once | ORAL | Status: DC | PRN
Start: 2017-12-09 — End: 2017-12-09

## 2017-12-09 MED ORDER — ALBUTEROL SULFATE HFA 108 (90 BASE) MCG/ACT IN AERS
INHALATION_SPRAY | RESPIRATORY_TRACT | Status: AC
  Filled 2017-12-09: qty 6.7

## 2017-12-09 MED ORDER — BUPIVACAINE HCL (PF) 0.5 % IJ SOLN
INTRAMUSCULAR | Status: AC
  Filled 2017-12-09: qty 10

## 2017-12-09 MED ORDER — ONDANSETRON HCL 4 MG/2ML IJ SOLN
INTRAMUSCULAR | Status: AC
  Filled 2017-12-09: qty 2

## 2017-12-09 MED ORDER — MIDAZOLAM HCL 2 MG/2ML IJ SOLN
INTRAMUSCULAR | Status: AC
  Filled 2017-12-09: qty 2

## 2017-12-09 MED ORDER — OXYMETAZOLINE HCL 0.05 % NA SOLN
NASAL | Status: DC | PRN
  Administered 2017-12-09: 1

## 2017-12-09 MED ORDER — HYDROCODONE-ACETAMINOPHEN 5-325 MG PO TABS: 1.0000 | ORAL_TABLET | Freq: Four times a day (QID) | ORAL | 0 refills | Status: AC | PRN

## 2017-12-09 MED ORDER — SUGAMMADEX SODIUM 500 MG/5ML IV SOLN
INTRAVENOUS | Status: AC
  Filled 2017-12-09: qty 5

## 2017-12-09 MED ORDER — ONDANSETRON HCL 4 MG/2ML IJ SOLN
INTRAMUSCULAR | Status: DC | PRN
  Administered 2017-12-09: 4 mg via INTRAVENOUS

## 2017-12-09 MED ORDER — LIDOCAINE HCL (PF) 1 % IJ SOLN
INTRAMUSCULAR | Status: DC | PRN
  Administered 2017-12-09: 30 mL

## 2017-12-09 MED ORDER — FENTANYL CITRATE (PF) 100 MCG/2ML IJ SOLN
INTRAMUSCULAR | Status: DC | PRN
  Administered 2017-12-09: 100 ug via INTRAVENOUS

## 2017-12-09 MED ORDER — NEOMYCIN-POLYMYXIN-DEXAMETH 3.5-10000-0.1 OP SUSP
1.0000 [drp] | OPHTHALMIC | Status: AC
  Administered 2017-12-09: 2 [drp] via OPHTHALMIC
  Filled 2017-12-09: qty 5

## 2017-12-09 MED ORDER — EPINEPHRINE HCL (NASAL) 0.1 % NA SOLN
NASAL | Status: AC
  Filled 2017-12-09: qty 30

## 2017-12-09 MED ORDER — BUPIVACAINE-EPINEPHRINE (PF) 0.5% -1:200000 IJ SOLN
INTRAMUSCULAR | Status: AC
  Filled 2017-12-09: qty 30

## 2017-12-09 SURGICAL SUPPLY — 46 items
APPLICATOR DR MATTHEWS STRL (MISCELLANEOUS) ×3 IMPLANT
BLADE EYE CATARACT 19 1.4 BEAV (BLADE) ×3 IMPLANT
BLADE SURG 15 STRL LF DISP TIS (BLADE) ×1 IMPLANT
BLADE SURG 15 STRL SS (BLADE) ×2
CLEANER TIP ELECTROSURG 2X2 (MISCELLANEOUS) ×3 IMPLANT
CLOSURE STERI-STRIP 1/2X4 (GAUZE/BANDAGES/DRESSINGS) ×1
CLOSURE WOUND 1/2 X4 (GAUZE/BANDAGES/DRESSINGS) ×1
CLSR STERI-STRIP ANTIMIC 1/2X4 (GAUZE/BANDAGES/DRESSINGS) ×2 IMPLANT
COAGULATOR SUCT 6 FR SWTCH (ELECTROSURGICAL) ×1
COAGULATOR SUCT SWTCH 10FR 6 (ELECTROSURGICAL) ×2 IMPLANT
CORD BI POLAR (MISCELLANEOUS) IMPLANT
CORD BIPOLAR FORCEPS 12FT (ELECTRODE) ×3 IMPLANT
COVER SURGICAL LIGHT HANDLE (MISCELLANEOUS) ×3 IMPLANT
Crawford Lacrimal Intubation Set (Ophthalmic Related) ×3 IMPLANT
DRAPE ORTHO SPLIT 87X125 STRL (DRAPES) ×3 IMPLANT
ELECT NEEDLE BLADE 2-5/6 (NEEDLE) ×3 IMPLANT
ELECT REM PT RETURN 9FT ADLT (ELECTROSURGICAL) ×3
ELECTRODE REM PT RTRN 9FT ADLT (ELECTROSURGICAL) ×1 IMPLANT
FORCEPS BIPOLAR SPETZLER 8 1.0 (NEUROSURGERY SUPPLIES) ×3 IMPLANT
GAUZE SPONGE 4X4 12PLY STRL LF (GAUZE/BANDAGES/DRESSINGS) ×3 IMPLANT
GLOVE BIO SURGEON STRL SZ7.5 (GLOVE) ×6 IMPLANT
GOWN STRL REUS W/ TWL LRG LVL3 (GOWN DISPOSABLE) ×2 IMPLANT
GOWN STRL REUS W/TWL LRG LVL3 (GOWN DISPOSABLE) ×4
NEEDLE HYPO 30X.5 LL (NEEDLE) IMPLANT
NS IRRIG 1000ML POUR BTL (IV SOLUTION) ×3 IMPLANT
PACK CATARACT CUSTOM (CUSTOM PROCEDURE TRAY) ×3 IMPLANT
PAD ARMBOARD 7.5X6 YLW CONV (MISCELLANEOUS) ×6 IMPLANT
PATTIES SURGICAL 1/4 X 3 (GAUZE/BANDAGES/DRESSINGS) ×3 IMPLANT
PENCIL BUTTON HOLSTER BLD 10FT (ELECTRODE) ×3 IMPLANT
SET INTBT LACRIMAL .016X.025 (MISCELLANEOUS) ×3 IMPLANT
SPOGE SURGIFLO 8M (HEMOSTASIS)
SPONGE SURGIFLO 8M (HEMOSTASIS) IMPLANT
STRIP CLOSURE SKIN 1/2X4 (GAUZE/BANDAGES/DRESSINGS) ×2 IMPLANT
SURGIFLO W/THROMBIN 8M KIT (HEMOSTASIS) IMPLANT
SUT ETHILON 6 0 9-3 1X18 BLK (SUTURE) IMPLANT
SUT ETHILON 6 0 P 1 (SUTURE) ×3 IMPLANT
SUT ETHILON 7 0 P 1 (SUTURE) IMPLANT
SUT PLAIN 5 0 P 3 18 (SUTURE) IMPLANT
SUT SILK 4 0 P 3 (SUTURE) ×9 IMPLANT
SUT SILK 6 0 BV 1XDISCX (SUTURE) IMPLANT
SUT VIC AB 5-0 P-3 18XBRD (SUTURE) ×1 IMPLANT
SUT VIC AB 5-0 P3 18 (SUTURE) ×2
TOWEL OR 17X24 6PK STRL BLUE (TOWEL DISPOSABLE) ×6 IMPLANT
TUBE CONNECTING 12'X1/4 (SUCTIONS) ×1
TUBE CONNECTING 12X1/4 (SUCTIONS) ×2 IMPLANT
WATER STERILE IRR 1000ML POUR (IV SOLUTION) ×3 IMPLANT

## 2017-12-09 NOTE — Brief Op Note (Signed)
12/09/2017  7:25 AM  PATIENT:  Norina BuzzardKimberly Ann Knaak  52 y.o. female  PRE-OPERATIVE DIAGNOSIS:  acute dacryocystitis of right lacrimal sac  POST-OPERATIVE DIAGNOSIS:  * No post-op diagnosis entered *  PROCEDURE:  Procedure(s): ANTERIOR ETHMOIDECTOMY, PROBE NLD W/STENT RIGHT EYE, DACROCYSTORHINOSTOMY (Right)  SURGEON:  Surgeon(s) and Role:    * Clide Remmers, Levester FreshUsiwoma Ene, MD - Primary  PHYSICIAN ASSISTANT: none  ASSISTANTS: none   ANESTHESIA:   local, general and IV sedation  EBL: <5650ml  BLOOD ADMINISTERED:none  DRAINS: none   LOCAL MEDICATIONS USED:  BUPIVICAINE with Epinephrine  and LIDOCAINE   SPECIMEN:  No Specimen  DISPOSITION OF SPECIMEN:  N/A  COUNTS:  YES  TOURNIQUET:  * No tourniquets in log *  DICTATION: .Note written in EPIC  PLAN OF CARE: Discharge to home after PACU  PATIENT DISPOSITION:  PACU - hemodynamically stable.   Delay start of Pharmacological VTE agent (>24hrs) due to surgical blood loss or risk of bleeding: no

## 2017-12-09 NOTE — Anesthesia Preprocedure Evaluation (Signed)
Anesthesia Evaluation  Patient identified by MRN, date of birth, ID band Patient awake    Reviewed: Allergy & Precautions, NPO status , Patient's Chart, lab work & pertinent test results  History of Anesthesia Complications Negative for: history of anesthetic complications  Airway Mallampati: II  TM Distance: >3 FB Neck ROM: Full    Dental  (+) Edentulous Upper, Edentulous Lower   Pulmonary sleep apnea , COPD, former smoker,    breath sounds clear to auscultation       Cardiovascular hypertension, Pt. on medications (-) angina(-) Past MI  Rhythm:Regular     Neuro/Psych PSYCHIATRIC DISORDERS Depression    GI/Hepatic Neg liver ROS, GERD  Medicated and Controlled,  Endo/Other  diabetes, Type 2Hypothyroidism Morbid obesity  Renal/GU negative Renal ROS     Musculoskeletal   Abdominal   Peds  Hematology   Anesthesia Other Findings   Reproductive/Obstetrics                             Anesthesia Physical Anesthesia Plan  ASA: III  Anesthesia Plan: General   Post-op Pain Management:    Induction: Intravenous  PONV Risk Score and Plan: 3 and Ondansetron and Dexamethasone  Airway Management Planned: LMA  Additional Equipment: None  Intra-op Plan:   Post-operative Plan: Extubation in OR  Informed Consent: I have reviewed the patients History and Physical, chart, labs and discussed the procedure including the risks, benefits and alternatives for the proposed anesthesia with the patient or authorized representative who has indicated his/her understanding and acceptance.   Dental advisory given  Plan Discussed with: CRNA and Surgeon  Anesthesia Plan Comments:         Anesthesia Quick Evaluation

## 2017-12-09 NOTE — Interval H&P Note (Signed)
History and Physical Interval Note:  12/09/2017 7:22 AM  Tabitha Scott  has presented today for surgery, with the diagnosis of acute dacryocystitis of right lacrimal sac  The various methods of treatment have been discussed with the patient and family. After consideration of risks, benefits and other options for treatment, the patient has consented to  Procedure(s): LACRIMAL SAC BIOPSY RIGHT EYE, ANTERIOR ETHMOIDECTOMY, PROBE NLD W/STENT RIGHT EYE, DACROCYSTORHINOSTOMY (Right) as a surgical intervention .  The patient's history has been reviewed, patient examined, no change in status, stable for surgery.  I have reviewed the patient's chart and labs.  Questions were answered to the patient's satisfaction.     Floydene FlockUsiwoma Ene Abugo

## 2017-12-09 NOTE — Anesthesia Procedure Notes (Signed)
Procedure Name: Intubation Date/Time: 12/09/2017 7:55 AM Performed by: Fransisca KaufmannMeyer, Quynh Basso E, CRNA Pre-anesthesia Checklist: Patient identified, Emergency Drugs available, Suction available and Patient being monitored Patient Re-evaluated:Patient Re-evaluated prior to induction Oxygen Delivery Method: Circle System Utilized Preoxygenation: Pre-oxygenation with 100% oxygen Induction Type: IV induction Ventilation: Mask ventilation without difficulty Laryngoscope Size: 2 Grade View: Grade I Tube type: Oral Tube size: 7.5 mm Number of attempts: 1 Airway Equipment and Method: Stylet and Oral airway Placement Confirmation: ETT inserted through vocal cords under direct vision,  positive ETCO2 and breath sounds checked- equal and bilateral Secured at: 22 cm Tube secured with: Tape Dental Injury: Teeth and Oropharynx as per pre-operative assessment

## 2017-12-09 NOTE — Transfer of Care (Signed)
Immediate Anesthesia Transfer of Care Note  Patient: Tabitha KernsKimberly Ann Criscione  Procedure(s) Performed: DACROCYSTORHINOSTOMY WITH ANTERIOR ETHMOIDECTOMY AND PUNCTOPLASTY, PROBING AND STENT PLACEMENT RIGHT EYE (Right Eye)  Patient Location: PACU  Anesthesia Type:General  Level of Consciousness: awake, alert , oriented and sedated  Airway & Oxygen Therapy: Patient Spontanous Breathing and Patient connected to face mask oxygen  Post-op Assessment: Report given to RN, Post -op Vital signs reviewed and stable and Patient moving all extremities  Post vital signs: Reviewed and stable  Last Vitals:  Vitals Value Taken Time  BP 144/75 12/09/2017  9:30 AM  Temp 36.7 C 12/09/2017  9:26 AM  Pulse 97 12/09/2017  9:32 AM  Resp 17 12/09/2017  9:32 AM  SpO2 94 % 12/09/2017  9:32 AM  Vitals shown include unvalidated device data.  Last Pain:  Vitals:   12/09/17 0926  PainSc: (P) 0-No pain         Complications: No apparent anesthesia complications

## 2017-12-09 NOTE — Op Note (Signed)
Procedure(s): LACRIMAL SAC BIOPSY RIGHT EYE, ANTERIOR ETHMOIDECTOMY, PROBE NLD W/STENT RIGHT EYE, DACROCYSTORHINOSTOMY Procedure Note  [name] female 52 y.o. 12/09/2017  Procedure(s) and Anesthesia Type:    * ANTERIOR ETHMOIDECTOMY, PROBE NLD W/STENT RIGHT EYE, DACROCYSTORHINOSTOMY - General  Surgeon(s) and Role:    * Mikiah Demond, Levester FreshUsiwoma Ene, MD - Primary  Procedure Detail  [facility], ANTERIOR ETHMOIDECTOMY, PROBE NLD W/STENT RIGHT EYE, DACROCYSTORHINOSTOMY  Indications: The patient was admitted to the hospital with a brief history of right-sided nasolacrimal obstruction.  An intitial probing revealed chronic dacryocystitis. The patient now presents for dacryocystorhinostomy after discussing therapeutic alternatives.        Surgeon: Floydene FlockUsiwoma Ene Shamika Pedregon   Assistants: none Anesthesia: General endotracheal anesthesia and Local anesthesia 1% buffered lidocaine, with epinephrine, .75 bupivicaine  ASA Class: 3  Procedure Detail  Patient was brought to the room in supine position where the appropriate monitoring was put in place. Then the patient was prepped and draped in the usual standard fashion of plastic surgery. The right nasal cavity was packed with cottonoid's soaked in afrin.  Attention was turned to the area between the medial canthus and the nasal bridge which has been previously marked. A 15 blade was used to incise the area. Then blunt dissection was taken down to the level of the anterior lacrimal crest. Then the periosteal elevator was used to bluntly remove the periosteum and the lacrimal sac off of the lacrimal crest. The elevator was used to infracture below the lacrimal crest to create an opening into the nasal cavity. Then a kerrison rongeur was used to remove the bone approximately 15mm to create a window into the nasal cavity. The nasal mucosa was infiltrated with local block. Then an anterior ethmoidectomy took place. Antention was turned to  the puncta where a 1 bowman probe was used to ensure the appropriate position of the nasolacrimal intubation tubes. A beaver blade was used to create an opening into the nasal cavity through the nasal mucosa. Crawford tubes were then inserted through the upper and lower canalicular system and retrieved through the nasal cavity with a curved forcep. This was sutured to the lateral nasal mucosa with a 4-0 silk suture.  Hemostasis was maintained with monopolar and bipolar cautery.  Surgiflow was placed into the defect and then the incision was closed with 5-0 undyed vicryl and then the skin was closed in an interrupted fashion with 6-0 nylon. Tobradex ointment was placed over the wound and it was covered with a steri-strip. Then the right nose was covered with an eyepad.  The patient tolerated the procedure well and was transferred to the PACU In stable condition.   Findings: dacroycystitis right side  Estimated Blood Loss:  Minimal         Drains:         Total IV Fluids: <2L  Blood Given: none          Specimens: none         Implants:  crawford stent        Complications:  * No complications entered in OR log *         Disposition: PACU - hemodynamically stable.         Condition: stable

## 2017-12-09 NOTE — Discharge Instructions (Addendum)
If you have an eye patch. Remove it in 24hrs. Place ice over the right eye for 20 mins every 2 hours for the first day after the patch is removed.  Put maxitrol drops in the right eye 2 times a day for 2 weeks.    Post Anesthesia Home Care Instructions  Activity: Get plenty of rest for the remainder of the day. A responsible individual must stay with you for 24 hours following the procedure.  For the next 24 hours, DO NOT: -Drive a car -Advertising copywriterperate machinery -Drink alcoholic beverages -Take any medication unless instructed by your physician -Make any legal decisions or sign important papers.  Meals: Start with liquid foods such as gelatin or soup. Progress to regular foods as tolerated. Avoid greasy, spicy, heavy foods. If nausea and/or vomiting occur, drink only clear liquids until the nausea and/or vomiting subsides. Call your physician if vomiting continues.  Special Instructions/Symptoms: Your throat may feel dry or sore from the anesthesia or the breathing tube placed in your throat during surgery. If this causes discomfort, gargle with warm salt water. The discomfort should disappear within 24 hours.  If you had a scopolamine patch placed behind your ear for the management of post- operative nausea and/or vomiting:  1. The medication in the patch is effective for 72 hours, after which it should be removed.  Wrap patch in a tissue and discard in the trash. Wash hands thoroughly with soap and water. 2. You may remove the patch earlier than 72 hours if you experience unpleasant side effects which may include dry mouth, dizziness or visual disturbances. 3. Avoid touching the patch. Wash your hands with soap and water after contact with the patch.

## 2017-12-10 NOTE — Anesthesia Postprocedure Evaluation (Signed)
Anesthesia Post Note  Patient: Tabitha Scott  Procedure(s) Performed: DACROCYSTORHINOSTOMY WITH ANTERIOR ETHMOIDECTOMY AND PUNCTOPLASTY, PROBING AND STENT PLACEMENT RIGHT EYE (Right Eye)     Patient location during evaluation: PACU Anesthesia Type: General Level of consciousness: awake and alert Pain management: pain level controlled Vital Signs Assessment: post-procedure vital signs reviewed and stable Respiratory status: spontaneous breathing, nonlabored ventilation, respiratory function stable and patient connected to nasal cannula oxygen Cardiovascular status: blood pressure returned to baseline and stable Postop Assessment: no apparent nausea or vomiting Anesthetic complications: no    Last Vitals:  Vitals:   12/09/17 1017 12/09/17 1100  BP: (!) 111/92 (!) 165/91  Pulse: 89 92  Resp: 15 18  Temp:    SpO2: 91% 92%    Last Pain:  Vitals:   12/09/17 1100  PainSc: 2                  Edilia Ghuman

## 2017-12-13 ENCOUNTER — Encounter (HOSPITAL_COMMUNITY): Payer: Self-pay | Admitting: Oculoplastics Ophthalmology

## 2017-12-14 ENCOUNTER — Ambulatory Visit: Payer: Medicaid Other | Admitting: Family Medicine

## 2017-12-22 ENCOUNTER — Encounter: Payer: Self-pay | Admitting: Family Medicine

## 2017-12-22 ENCOUNTER — Ambulatory Visit (INDEPENDENT_AMBULATORY_CARE_PROVIDER_SITE_OTHER): Payer: Medicaid Other | Admitting: Family Medicine

## 2017-12-22 VITALS — BP 132/68 | HR 84 | Temp 98.7°F | Resp 18 | Ht 64.0 in | Wt 331.0 lb

## 2017-12-22 DIAGNOSIS — E785 Hyperlipidemia, unspecified: Secondary | ICD-10-CM | POA: Diagnosis not present

## 2017-12-22 DIAGNOSIS — I10 Essential (primary) hypertension: Secondary | ICD-10-CM

## 2017-12-22 DIAGNOSIS — G629 Polyneuropathy, unspecified: Secondary | ICD-10-CM

## 2017-12-22 DIAGNOSIS — R05 Cough: Secondary | ICD-10-CM | POA: Diagnosis not present

## 2017-12-22 DIAGNOSIS — G894 Chronic pain syndrome: Secondary | ICD-10-CM | POA: Diagnosis not present

## 2017-12-22 DIAGNOSIS — I5189 Other ill-defined heart diseases: Secondary | ICD-10-CM | POA: Diagnosis not present

## 2017-12-22 DIAGNOSIS — R600 Localized edema: Secondary | ICD-10-CM

## 2017-12-22 DIAGNOSIS — R7303 Prediabetes: Secondary | ICD-10-CM

## 2017-12-22 DIAGNOSIS — R059 Cough, unspecified: Secondary | ICD-10-CM

## 2017-12-22 DIAGNOSIS — E039 Hypothyroidism, unspecified: Secondary | ICD-10-CM | POA: Diagnosis not present

## 2017-12-22 DIAGNOSIS — K219 Gastro-esophageal reflux disease without esophagitis: Secondary | ICD-10-CM

## 2017-12-22 DIAGNOSIS — R062 Wheezing: Secondary | ICD-10-CM | POA: Diagnosis not present

## 2017-12-22 LAB — POCT URINALYSIS DIPSTICK
Bilirubin, UA: NEGATIVE
Blood, UA: NEGATIVE
GLUCOSE UA: NEGATIVE
KETONES UA: NEGATIVE
Leukocytes, UA: NEGATIVE
Nitrite, UA: NEGATIVE
PH UA: 6 (ref 5.0–8.0)
Protein, UA: NEGATIVE
Spec Grav, UA: 1.02 (ref 1.010–1.025)
UROBILINOGEN UA: 0.2 U/dL

## 2017-12-22 LAB — POCT GLYCOSYLATED HEMOGLOBIN (HGB A1C): Hemoglobin A1C: 5.7

## 2017-12-22 MED ORDER — OMEPRAZOLE 40 MG PO CPDR: 40.0000 mg | DELAYED_RELEASE_CAPSULE | Freq: Two times a day (BID) | ORAL | 2 refills | Status: DC

## 2017-12-22 MED ORDER — ALBUTEROL SULFATE (2.5 MG/3ML) 0.083% IN NEBU
2.5000 mg | INHALATION_SOLUTION | Freq: Once | RESPIRATORY_TRACT | Status: AC
  Administered 2017-12-22: 2.5 mg via RESPIRATORY_TRACT

## 2017-12-22 MED ORDER — DULOXETINE HCL 30 MG PO CPEP: 30.0000 mg | ORAL_CAPSULE | Freq: Two times a day (BID) | ORAL | 2 refills | Status: DC

## 2017-12-22 MED ORDER — FUROSEMIDE 20 MG PO TABS
20.0000 mg | ORAL_TABLET | Freq: Every day | ORAL | 3 refills | Status: DC
Start: 2017-12-22 — End: 2018-05-03

## 2017-12-22 MED ORDER — GABAPENTIN 300 MG PO CAPS: ORAL_CAPSULE | ORAL | 2 refills | Status: DC

## 2017-12-22 MED ORDER — SPIRONOLACTONE-HCTZ 25-25 MG PO TABS: 1.0000 | ORAL_TABLET | Freq: Every day | ORAL | 11 refills | Status: DC

## 2017-12-22 MED ORDER — ATORVASTATIN CALCIUM 40 MG PO TABS: 40.0000 mg | ORAL_TABLET | Freq: Every day | ORAL | 2 refills | Status: DC

## 2017-12-22 MED ORDER — IPRATROPIUM BROMIDE 0.02 % IN SOLN
0.5000 mg | Freq: Once | RESPIRATORY_TRACT | Status: AC
  Administered 2017-12-22: 0.5 mg via RESPIRATORY_TRACT

## 2017-12-22 NOTE — Progress Notes (Signed)
Subjective:    Patient ID: Tabitha Scott, female    DOB: 30-Aug-1965, 52 y.o.   MRN: [id number] . HPI Tabitha Scott, a 52 year old female with a history of neuropathy, hyperlipidemia, hypertension, obstructive sleep apnea chronic, and COPD presents for a six-month follow-up of chronic conditions.  Patient states that she has been taking all prescribed medications consistently.  She does not routinely monitor blood pressures at home.  Patient also does not follow a low-fat, low-sodium diet or exercise routinely. Body mass index is 56.82 kg/m. Patient states that she does not exercise routinely due to chronic pain.  Patient was previously referred to pain clinic, but has not scheduled appointment.  Patient endorses bilateral lower extremity swelling.  She continues furosemide and Aldactazide as prescribed with moderate. Patient also has a history of hypothyroidism.  She has been taking levothyroxine daily as prescribed.  She denies fatigue, constipation, or intolerance to heat/cold.  Patient also has a history of GERD.  This has been associated with heartburn.  She denies abdominal bloating, choking on food, deep pressure at base of neck, difficulty swallowing, dysphagia and fullness after meals  She has not lost weight. She denies melena, hematochezia, hematemesis, and coffee ground emesis. Medical therapy in the past has included proton pump inhibitors.  Past Medical History:  Diagnosis Date  . Arthritis   . Chronic left shoulder pain   . Chronic lower back pain   . COPD (chronic obstructive pulmonary disease) (HCC)    pt states she does not have COPD  . Depression   . GERD (gastroesophageal reflux disease)   . High cholesterol   . Hyperlipidemia   . Hypertension    no longer on medications - stopped taking 4 years ago  . Hypothyroidism   . Low iron   . Neuropathy   . On home oxygen therapy    "have it available; quit smoking; not using it" (04/16/2016)  . OSA (obstructive  sleep apnea)    not using cpap  . Thyroid disease   . Type II diabetes mellitus (HCC)    Pt was told she did not have diabetes, was treated at one time   Social History   Socioeconomic History  . Marital status: Divorced    Spouse name: Not on file  . Number of children: Not on file  . Years of education: Not on file  . Highest education level: Not on file  Occupational History  . Not on file  Social Needs  . Financial resource strain: Not on file  . Food insecurity:    Worry: Not on file    Inability: Not on file  . Transportation needs:    Medical: Not on file    Non-medical: Not on file  Tobacco Use  . Smoking status: Former Smoker    Packs/day: 1.50    Years: 41.00    Pack years: 61.50    Types: Cigarettes    Last attempt to quit: 02/11/2015    Years since quitting: 2.8  . Smokeless tobacco: Never Used  Substance and Sexual Activity  . Alcohol use: No    Alcohol/week: 0.0 oz  . Drug use: No  . Sexual activity: Never  Lifestyle  . Physical activity:    Days per week: Not on file    Minutes per session: Not on file  . Stress: Not on file  Relationships  . Social connections:    Talks on phone: Not on file    Gets together: Not on  file    Attends religious service: Not on file    Active member of club or organization: Not on file    Attends meetings of clubs or organizations: Not on file    Relationship status: Not on file  . Intimate partner violence:    Fear of current or ex partner: Not on file    Emotionally abused: Not on file    Physically abused: Not on file    Forced sexual activity: Not on file  Other Topics Concern  . Not on file  Social History Narrative  . Not on file   Immunization History  Administered Date(s) Administered  . Influenza,inj,Quad PF,6+ Mos 10/19/2015, 06/15/2017    Review of Systems  Constitutional: Negative.  Negative for fatigue.  HENT: Negative.   Eyes: Negative.   Respiratory: Negative.   Cardiovascular: Positive  for leg swelling. Negative for chest pain.  Gastrointestinal: Negative.   Endocrine: Negative for polydipsia, polyphagia and polyuria.  Genitourinary: Negative.   Musculoskeletal: Positive for arthralgias (upper and lower extremities).  Skin: Negative.   Neurological: Negative.  Negative for dizziness and facial asymmetry.  Hematological: Negative.   Psychiatric/Behavioral: Negative.  Negative for self-injury.       Objective:   Physical Exam  Constitutional: She is oriented to person, place, and time. She appears well-developed.  HENT:  Head: Normocephalic and atraumatic.  Right Ear: External ear normal.  Left Ear: External ear normal.  Nose: Nose normal.  Mouth/Throat: Oropharynx is clear and moist.  Eyes: Pupils are equal, round, and reactive to light. Conjunctivae and EOM are normal.  Neck: Normal range of motion. Neck supple.  Cardiovascular: Normal rate, regular rhythm, normal heart sounds and intact distal pulses.  2+ bilateral lower extremity edema  Pulmonary/Chest: Effort normal. She has wheezes in the right upper field, the right middle field, the right lower field, the left upper field, the left middle field and the left lower field.  Abdominal: Soft. Bowel sounds are normal.  Abdominal obesity with pendulous abdomen  Musculoskeletal: Normal range of motion.  Neurological: She is alert and oriented to person, place, and time.  Skin: Skin is warm and dry.  Psychiatric: She has a normal mood and affect. Her behavior is normal. Judgment and thought content normal.      BP 132/68 (BP Location: Left Arm, Patient Position: Sitting, Cuff Size: Large)   Pulse 84   Temp 98.7 F (37.1 C) (Oral)   Resp 18   Ht  (1.626 m)   Wt (!) 331 lb (150.1 kg)   SpO2 92%   BMI 56.82 kg/m  Assessment & Plan:  1. Essential hypertension Blood pressure is at goal on current medication regimen. No changes warranted on today.  Continue medication, monitor blood pressure at home.  Continue DASH diet. Reminded to go to the ER if any CP, SOB, nausea, dizziness, severe HA, changes vision/speech, left arm numbness and tingling and jaw pain.  - Urinalysis Dipstick - Comprehensive metabolic panel  2. Prediabetes  - Comprehensive metabolic panel  3. Morbid (severe) obesity due to excess calories (HCC) Body mass index is 56.82 kg/m. Recommend a lowfat, low carbohydrate diet divided over 5-6 small meals, increase water intake to 6-8 glasses, and 150 minutes per week of cardiovascular exercise.    4. Diastolic dysfunction  - spironolactone-hydrochlorothiazide (ALDACTAZIDE) 25-25 MG tablet; Take 1 tablet by mouth daily.  Dispense: 30 tablet; Refill: 11  5. Gastroesophageal reflux disease without esophagitis - omeprazole (PRILOSEC) 40 MG capsule; Take 1  capsule (40 mg total) by mouth 2 (two) times daily.  Dispense: 180 capsule; Refill: 2  6. Acquired hypothyroidism - Thyroid Panel With TSH  7. Neuropathy - gabapentin (NEURONTIN) 300 MG capsule; TAKE 300 MG BY MOUTH 4 TIMES DAILY  Dispense: 180 capsule; Refill: 2 - DULoxetine (CYMBALTA) 30 MG capsule; Take 1 capsule (30 mg total) by mouth 2 (two) times daily. Take 1 tablet once daily for 7 days, then 2 tablets daily  Dispense: 180 capsule; Refill: 2  8. Lower extremity edema  - furosemide (LASIX) 20 MG tablet; Take 1 tablet (20 mg total) by mouth daily.  Dispense: 90 tablet; Refill: 3  9. Hyperlipidemia LDL goal <100 The 10-year ASCVD risk score Denman George DC Jr., et al., 2013) is: 4.7%   Values used to calculate the score:     Age: 30 years     Sex: Female     Is Non-Hispanic African American: No     Diabetic: Yes     Tobacco smoker: No     Systolic Blood Pressure: 132 mmHg     Is BP treated: Yes     HDL Cholesterol: 37 mg/dL     Total Cholesterol: 144 mg/dL  - atorvastatin (LIPITOR) 40 MG tablet; Take 1 tablet (40 mg total) by mouth daily.  Dispense: 90 tablet; Refill: 2  10. Chronic pain syndrome -  Ambulatory referral to Pain Clinic  11. Cough - albuterol (PROVENTIL) (2.5 MG/3ML) 0.083% nebulizer solution 2.5 mg - ipratropium (ATROVENT) nebulizer solution 0.5 mg  12. Wheezing - albuterol (PROVENTIL) (2.5 MG/3ML) 0.083% nebulizer solution 2.5 mg - ipratropium (ATROVENT) nebulizer solution 0.5 mg   RTC: 3 months for chronic problems   Nolon Nations  MSN, FNP-C Patient Care [facility] [address] [place], [place] [address] [phone]

## 2017-12-22 NOTE — Patient Instructions (Addendum)
We will follow-up in 3 months for chronic conditions.  Your blood pressure is at goal on current medication regimen.  However you continue to have increased edema in your lower extremities.  We will continue furosemide 20 mg daily and will continue Aldactazide as previously prescribed.  Elevate lower extremities to heart level while at rest.  I have sent a referral to pain management, they will call you to schedule appointment.  Please give 7-10 business days.   Your body mass index is consistent with morbid obesity.  I recommend that she follow a low carbohydrate diet divided over 5-6 small meals throughout the day.  We discussed what that particular diet looks like.  Your weight loss goal is to lose 10% of your body weight over the next 6 months, which is equivalent to about 31 pounds.  Increase daily activity level.   We will follow-up by phone with any abnormal laboratory results.  I will place your order for levothyroxine on tomorrow after reviewing TSH.

## 2017-12-24 ENCOUNTER — Telehealth: Payer: Self-pay

## 2017-12-24 LAB — COMPREHENSIVE METABOLIC PANEL
ALBUMIN: 4.2 g/dL (ref 3.5–5.5)
ALK PHOS: 107 IU/L (ref 39–117)
ALT: 22 IU/L (ref 0–32)
AST: 26 IU/L (ref 0–40)
Albumin/Globulin Ratio: 1.4 (ref 1.2–2.2)
BILIRUBIN TOTAL: 0.4 mg/dL (ref 0.0–1.2)
BUN / CREAT RATIO: 8 — AB (ref 9–23)
BUN: 10 mg/dL (ref 6–24)
CO2: 27 mmol/L (ref 20–29)
CREATININE: 1.31 mg/dL — AB (ref 0.57–1.00)
Calcium: 9.2 mg/dL (ref 8.7–10.2)
Chloride: 94 mmol/L — ABNORMAL LOW (ref 96–106)
GFR calc non Af Amer: 47 mL/min/{1.73_m2} — ABNORMAL LOW (ref >59)
GFR, EST AFRICAN AMERICAN: 54 mL/min/{1.73_m2} — AB (ref >59)
GLUCOSE: 90 mg/dL (ref 65–99)
Globulin, Total: 3 g/dL (ref 1.5–4.5)
Potassium: 4.1 mmol/L (ref 3.5–5.2)
SODIUM: 139 mmol/L (ref 134–144)
TOTAL PROTEIN: 7.2 g/dL (ref 6.0–8.5)

## 2017-12-24 LAB — THYROID PANEL WITH TSH
FREE THYROXINE INDEX: 1.4 (ref 1.2–4.9)
T3 Uptake Ratio: 25 % (ref 24–39)
T4, Total: 5.7 ug/dL (ref 4.5–12.0)
TSH: 4.86 u[IU]/mL — ABNORMAL HIGH (ref 0.450–4.500)

## 2017-12-25 ENCOUNTER — Other Ambulatory Visit: Payer: Self-pay | Admitting: Family Medicine

## 2017-12-25 DIAGNOSIS — E039 Hypothyroidism, unspecified: Secondary | ICD-10-CM

## 2017-12-25 NOTE — Progress Notes (Signed)
Orders Placed This Encounter  Procedures  . TSH    Standing Status:   Future    Standing Expiration Date:   12/26/2018    Nolon Nations  MSN, FNP-C Patient Care [facility] [address] [place], [place] [address] [phone]

## 2017-12-29 ENCOUNTER — Telehealth: Payer: Self-pay

## 2017-12-29 NOTE — Telephone Encounter (Signed)
Left a vm for patient to callback 

## 2017-12-29 NOTE — Telephone Encounter (Signed)
Patient notified

## 2017-12-29 NOTE — Telephone Encounter (Signed)
-----   Message from Massie Maroon, [place] sent at 12/25/2017  6:20 AM EDT ----- Regarding: lab resutls Please inform patient that TSH is mildly elevated. Ensure that she is taking levothyroxine 25 mcg consistently (early morning on an empty stomach). Will recheck in 6 weeks, she may warrant medication adjustment at that time.   Nolon Nations  MSN, FNP-C Patient Care [facility] [address] [place], [place] [address] [phone]

## 2017-12-30 NOTE — Telephone Encounter (Signed)
Tried to contact patient no answer 

## 2018-02-17 DIAGNOSIS — M654 Radial styloid tenosynovitis [de Quervain]: Secondary | ICD-10-CM | POA: Insufficient documentation

## 2018-02-17 DIAGNOSIS — M79641 Pain in right hand: Secondary | ICD-10-CM | POA: Insufficient documentation

## 2018-02-25 ENCOUNTER — Other Ambulatory Visit: Payer: Self-pay | Admitting: Orthopedic Surgery

## 2018-03-10 ENCOUNTER — Other Ambulatory Visit: Payer: Self-pay

## 2018-03-10 ENCOUNTER — Encounter (HOSPITAL_COMMUNITY): Payer: Self-pay | Admitting: *Deleted

## 2018-03-11 ENCOUNTER — Ambulatory Visit (HOSPITAL_COMMUNITY): Payer: Medicaid Other | Admitting: Anesthesiology

## 2018-03-11 ENCOUNTER — Encounter (HOSPITAL_COMMUNITY): Payer: Self-pay | Admitting: *Deleted

## 2018-03-11 ENCOUNTER — Ambulatory Visit (HOSPITAL_COMMUNITY)
Admission: RE | Admit: 2018-03-11 | Discharge: 2018-03-11 | Disposition: A | Discharge status: Home / Self Care | Payer: Medicaid Other | Source: Ambulatory Visit | Attending: Orthopedic Surgery | Admitting: Orthopedic Surgery

## 2018-03-11 ENCOUNTER — Encounter (HOSPITAL_COMMUNITY)
Admission: RE | Disposition: A | Discharge status: Home / Self Care | Payer: Self-pay | Source: Ambulatory Visit | Attending: Orthopedic Surgery

## 2018-03-11 DIAGNOSIS — G629 Polyneuropathy, unspecified: Secondary | ICD-10-CM | POA: Insufficient documentation

## 2018-03-11 DIAGNOSIS — M659 Synovitis and tenosynovitis, unspecified: Secondary | ICD-10-CM | POA: Insufficient documentation

## 2018-03-11 DIAGNOSIS — M1811 Unilateral primary osteoarthritis of first carpometacarpal joint, right hand: Secondary | ICD-10-CM | POA: Insufficient documentation

## 2018-03-11 DIAGNOSIS — F329 Major depressive disorder, single episode, unspecified: Secondary | ICD-10-CM | POA: Insufficient documentation

## 2018-03-11 DIAGNOSIS — Z6841 Body Mass Index (BMI) 40.0 and over, adult: Secondary | ICD-10-CM | POA: Diagnosis not present

## 2018-03-11 DIAGNOSIS — Z79890 Hormone replacement therapy (postmenopausal): Secondary | ICD-10-CM | POA: Diagnosis not present

## 2018-03-11 DIAGNOSIS — G4733 Obstructive sleep apnea (adult) (pediatric): Secondary | ICD-10-CM | POA: Diagnosis not present

## 2018-03-11 DIAGNOSIS — Z79891 Long term (current) use of opiate analgesic: Secondary | ICD-10-CM | POA: Insufficient documentation

## 2018-03-11 DIAGNOSIS — E039 Hypothyroidism, unspecified: Secondary | ICD-10-CM | POA: Diagnosis not present

## 2018-03-11 DIAGNOSIS — Z87891 Personal history of nicotine dependence: Secondary | ICD-10-CM | POA: Insufficient documentation

## 2018-03-11 DIAGNOSIS — J449 Chronic obstructive pulmonary disease, unspecified: Secondary | ICD-10-CM | POA: Diagnosis not present

## 2018-03-11 DIAGNOSIS — M25541 Pain in joints of right hand: Secondary | ICD-10-CM | POA: Insufficient documentation

## 2018-03-11 DIAGNOSIS — E7800 Pure hypercholesterolemia, unspecified: Secondary | ICD-10-CM | POA: Insufficient documentation

## 2018-03-11 DIAGNOSIS — K219 Gastro-esophageal reflux disease without esophagitis: Secondary | ICD-10-CM | POA: Diagnosis not present

## 2018-03-11 DIAGNOSIS — Z96692 Finger-joint replacement of left hand: Secondary | ICD-10-CM | POA: Insufficient documentation

## 2018-03-11 DIAGNOSIS — Z79899 Other long term (current) drug therapy: Secondary | ICD-10-CM | POA: Diagnosis not present

## 2018-03-11 DIAGNOSIS — E119 Type 2 diabetes mellitus without complications: Secondary | ICD-10-CM | POA: Insufficient documentation

## 2018-03-11 DIAGNOSIS — I10 Essential (primary) hypertension: Secondary | ICD-10-CM | POA: Diagnosis not present

## 2018-03-11 DIAGNOSIS — E785 Hyperlipidemia, unspecified: Secondary | ICD-10-CM | POA: Insufficient documentation

## 2018-03-11 DIAGNOSIS — M79644 Pain in right finger(s): Secondary | ICD-10-CM | POA: Diagnosis present

## 2018-03-11 HISTORY — DX: Carpal tunnel syndrome, unspecified upper limb: G56.00

## 2018-03-11 HISTORY — DX: Pneumonia, unspecified organism: J18.9

## 2018-03-11 HISTORY — PX: WRIST ARTHROSCOPY WITH CARPOMETACARPEL (CMC) ARTHROPLASTY: SHX5680

## 2018-03-11 LAB — CBC
HEMATOCRIT: 37.5 % (ref 36.0–46.0)
HEMOGLOBIN: 11.3 g/dL — AB (ref 12.0–15.0)
MCH: 27.3 pg (ref 26.0–34.0)
MCHC: 30.1 g/dL (ref 30.0–36.0)
MCV: 90.6 fL (ref 78.0–100.0)
Platelets: 225 10*3/uL (ref 150–400)
RBC: 4.14 MIL/uL (ref 3.87–5.11)
RDW: 17.5 % — ABNORMAL HIGH (ref 11.5–15.5)
WBC: 8.2 10*3/uL (ref 4.0–10.5)

## 2018-03-11 LAB — BASIC METABOLIC PANEL
ANION GAP: 14 (ref 5–15)
BUN: 11 mg/dL (ref 6–20)
CALCIUM: 8.9 mg/dL (ref 8.9–10.3)
CO2: 32 mmol/L (ref 22–32)
CREATININE: 1.23 mg/dL — AB (ref 0.44–1.00)
Chloride: 94 mmol/L — ABNORMAL LOW (ref 98–111)
GFR calc non Af Amer: 49 mL/min — ABNORMAL LOW (ref >60)
GFR, EST AFRICAN AMERICAN: 57 mL/min — AB (ref >60)
GLUCOSE: 125 mg/dL — AB (ref 70–99)
Potassium: 2.9 mmol/L — ABNORMAL LOW (ref 3.5–5.1)
Sodium: 140 mmol/L (ref 135–145)

## 2018-03-11 LAB — SURGICAL PCR SCREEN
MRSA, PCR: NEGATIVE
Staphylococcus aureus: NEGATIVE

## 2018-03-11 LAB — GLUCOSE, CAPILLARY: Glucose-Capillary: 141 mg/dL — ABNORMAL HIGH (ref 70–99)

## 2018-03-11 SURGERY — WRIST ARTHROSCOPY WITH CARPOMETACARPEL (CMC) ARTHROPLASTY
Anesthesia: Monitor Anesthesia Care | Site: Thumb | Laterality: Right

## 2018-03-11 MED ORDER — LACTATED RINGERS IV SOLN
INTRAVENOUS | Status: DC | PRN
  Administered 2018-03-11: 07:00:00 via INTRAVENOUS

## 2018-03-11 MED ORDER — CHLORHEXIDINE GLUCONATE 4 % EX LIQD: 60.0000 mL | Freq: Once | CUTANEOUS | Status: DC

## 2018-03-11 MED ORDER — FENTANYL CITRATE (PF) 250 MCG/5ML IJ SOLN
INTRAMUSCULAR | Status: AC
  Filled 2018-03-11: qty 5

## 2018-03-11 MED ORDER — FENTANYL CITRATE (PF) 100 MCG/2ML IJ SOLN
INTRAMUSCULAR | Status: DC | PRN
  Administered 2018-03-11: 25 ug via INTRAVENOUS

## 2018-03-11 MED ORDER — PROPOFOL 10 MG/ML IV BOLUS
INTRAVENOUS | Status: DC | PRN
  Administered 2018-03-11: 20 mg via INTRAVENOUS
  Administered 2018-03-11: 30 mg via INTRAVENOUS

## 2018-03-11 MED ORDER — MIDAZOLAM HCL 2 MG/2ML IJ SOLN
INTRAMUSCULAR | Status: AC
  Filled 2018-03-11: qty 2

## 2018-03-11 MED ORDER — ROCURONIUM BROMIDE 10 MG/ML (PF) SYRINGE
PREFILLED_SYRINGE | INTRAVENOUS | Status: AC
  Filled 2018-03-11: qty 10

## 2018-03-11 MED ORDER — DEXMEDETOMIDINE HCL IN NACL 200 MCG/50ML IV SOLN
INTRAVENOUS | Status: DC | PRN
  Administered 2018-03-11 (×2): 10 ug via INTRAVENOUS
  Administered 2018-03-11 (×2): 8 ug via INTRAVENOUS
  Administered 2018-03-11: 20 ug via INTRAVENOUS

## 2018-03-11 MED ORDER — CEFAZOLIN SODIUM 1 G IJ SOLR
INTRAMUSCULAR | Status: AC
  Filled 2018-03-11: qty 10

## 2018-03-11 MED ORDER — LIDOCAINE 2% (20 MG/ML) 5 ML SYRINGE
INTRAMUSCULAR | Status: AC
  Filled 2018-03-11: qty 5

## 2018-03-11 MED ORDER — CEFAZOLIN SODIUM-DEXTROSE 2-4 GM/100ML-% IV SOLN
2.0000 g | INTRAVENOUS | Status: AC
  Administered 2018-03-11: 3 g via INTRAVENOUS
  Filled 2018-03-11: qty 100

## 2018-03-11 MED ORDER — PROPOFOL 500 MG/50ML IV EMUL
INTRAVENOUS | Status: DC | PRN
  Administered 2018-03-11: 50 ug/kg/min via INTRAVENOUS

## 2018-03-11 MED ORDER — EPHEDRINE SULFATE 50 MG/ML IJ SOLN
INTRAMUSCULAR | Status: AC
  Filled 2018-03-11: qty 1

## 2018-03-11 MED ORDER — DEXAMETHASONE SODIUM PHOSPHATE 10 MG/ML IJ SOLN
INTRAMUSCULAR | Status: AC
  Filled 2018-03-11: qty 1

## 2018-03-11 MED ORDER — ONDANSETRON HCL 4 MG/2ML IJ SOLN
INTRAMUSCULAR | Status: AC
  Filled 2018-03-11: qty 2

## 2018-03-11 MED ORDER — PROPOFOL 1000 MG/100ML IV EMUL
INTRAVENOUS | Status: AC
  Filled 2018-03-11: qty 100

## 2018-03-11 MED ORDER — 0.9 % SODIUM CHLORIDE (POUR BTL) OPTIME
TOPICAL | Status: DC | PRN
  Administered 2018-03-11: 1000 mL

## 2018-03-11 MED ORDER — ONDANSETRON HCL 4 MG/2ML IJ SOLN
INTRAMUSCULAR | Status: DC | PRN
  Administered 2018-03-11: 4 mg via INTRAVENOUS

## 2018-03-11 MED ORDER — MIDAZOLAM HCL 5 MG/5ML IJ SOLN
INTRAMUSCULAR | Status: DC | PRN
  Administered 2018-03-11 (×2): 1 mg via INTRAVENOUS

## 2018-03-11 MED ORDER — DEXMEDETOMIDINE HCL IN NACL 200 MCG/50ML IV SOLN
INTRAVENOUS | Status: AC
  Filled 2018-03-11: qty 50

## 2018-03-11 SURGICAL SUPPLY — 58 items
ANCH SUT SWLK MED 13.5X3.5 (Anchor) ×2 IMPLANT
APL SKNCLS STERI-STRIP NONHPOA (GAUZE/BANDAGES/DRESSINGS) ×1
BANDAGE ACE 3X5.8 VEL STRL LF (GAUZE/BANDAGES/DRESSINGS) ×3 IMPLANT
BENZOIN TINCTURE PRP APPL 2/3 (GAUZE/BANDAGES/DRESSINGS) ×3 IMPLANT
BNDG CMPR 9X4 STRL LF SNTH (GAUZE/BANDAGES/DRESSINGS) ×1
BNDG ESMARK 4X9 LF (GAUZE/BANDAGES/DRESSINGS) ×3 IMPLANT
BNDG GAUZE ELAST 4 BULKY (GAUZE/BANDAGES/DRESSINGS) ×3 IMPLANT
CLOSURE WOUND 1/2 X4 (GAUZE/BANDAGES/DRESSINGS) ×1
CORDS BIPOLAR (ELECTRODE) ×3 IMPLANT
COVER SURGICAL LIGHT HANDLE (MISCELLANEOUS) ×3 IMPLANT
CUFF TOURNIQUET SINGLE 24IN (TOURNIQUET CUFF) ×3 IMPLANT
DRAPE OEC MINIVIEW 54X84 (DRAPES) IMPLANT
DRAPE SURG 17X23 STRL (DRAPES) ×6 IMPLANT
DURAPREP 26ML APPLICATOR (WOUND CARE) ×3 IMPLANT
GAUZE SPONGE 2X2 8PLY STRL LF (GAUZE/BANDAGES/DRESSINGS) IMPLANT
GAUZE SPONGE 4X4 12PLY STRL (GAUZE/BANDAGES/DRESSINGS) ×3 IMPLANT
GAUZE XEROFORM 1X8 LF (GAUZE/BANDAGES/DRESSINGS) IMPLANT
GLOVE BIOGEL PI IND STRL 6 (GLOVE) ×1 IMPLANT
GLOVE BIOGEL PI INDICATOR 6 (GLOVE) ×2
GLOVE SURG SS PI 6.0 STRL IVOR (GLOVE) ×3 IMPLANT
GLOVE SURG SYN 8.0 (GLOVE) ×3 IMPLANT
GOWN STRL REUS W/ TWL LRG LVL3 (GOWN DISPOSABLE) ×1 IMPLANT
GOWN STRL REUS W/ TWL XL LVL3 (GOWN DISPOSABLE) ×2 IMPLANT
GOWN STRL REUS W/TWL LRG LVL3 (GOWN DISPOSABLE) ×3
GOWN STRL REUS W/TWL XL LVL3 (GOWN DISPOSABLE) ×6
IMPL SWIVELOCK 3.5X13.5 (Anchor) ×2 IMPLANT
IMPLANT SWIVELOCK 3.5X13.5 (Anchor) ×6 IMPLANT
KIT ASCP FXDISP 3X8XBTNDS (KITS) ×1 IMPLANT
KIT BASIN OR (CUSTOM PROCEDURE TRAY) ×3 IMPLANT
KIT BIO-TENODESIS 3X8 DISP (KITS) ×3
KIT TURNOVER KIT B (KITS) ×3 IMPLANT
MANIFOLD NEPTUNE II (INSTRUMENTS) ×3 IMPLANT
NEEDLE HYPO 25GX1X1/2 BEV (NEEDLE) IMPLANT
NEEDLE HYPO 25X1 1.5 SAFETY (NEEDLE) ×3 IMPLANT
NS IRRIG 1000ML POUR BTL (IV SOLUTION) ×3 IMPLANT
PACK ORTHO EXTREMITY (CUSTOM PROCEDURE TRAY) ×3 IMPLANT
PAD ARMBOARD 7.5X6 YLW CONV (MISCELLANEOUS) ×3 IMPLANT
PAD CAST 3X4 CTTN HI CHSV (CAST SUPPLIES) ×2 IMPLANT
PADDING CAST COTTON 3X4 STRL (CAST SUPPLIES) ×6
PASSER SUT SWANSON 36MM LOOP (INSTRUMENTS) ×3 IMPLANT
SPLINT PLASTER EXTRA FAST 3X15 (CAST SUPPLIES) ×2
SPLINT PLASTER GYPS XFAST 3X15 (CAST SUPPLIES) ×1 IMPLANT
SPONGE GAUZE 2X2 STER 10/PKG (GAUZE/BANDAGES/DRESSINGS)
STRIP CLOSURE SKIN 1/2X4 (GAUZE/BANDAGES/DRESSINGS) ×2 IMPLANT
SUT ETHILON 4 0 PS 2 18 (SUTURE) IMPLANT
SUT ETHILON 5 0 PS 2 18 (SUTURE) IMPLANT
SUT PROLENE 3 0 PS 2 (SUTURE) ×3 IMPLANT
SUT VIC AB 3-0 FS2 27 (SUTURE) IMPLANT
SUT VIC AB 4-0 P-3 18X BRD (SUTURE) IMPLANT
SUT VIC AB 4-0 P3 18 (SUTURE)
SUT VIC AB 4-0 PS2 27 (SUTURE) ×3 IMPLANT
SYR CONTROL 10ML LL (SYRINGE) ×3 IMPLANT
TOWEL OR 17X24 6PK STRL BLUE (TOWEL DISPOSABLE) ×3 IMPLANT
TOWEL OR 17X26 10 PK STRL BLUE (TOWEL DISPOSABLE) ×3 IMPLANT
TUBE CONNECTING 12'X1/4 (SUCTIONS) ×1
TUBE CONNECTING 12X1/4 (SUCTIONS) ×2 IMPLANT
UNDERPAD 30X30 (UNDERPADS AND DIAPERS) ×3 IMPLANT
WATER STERILE IRR 1000ML POUR (IV SOLUTION) ×3 IMPLANT

## 2018-03-11 NOTE — Op Note (Signed)
[name]: Scott, Charmane [facility] MEDICAL RECORD [id number] ACCOUNT [id number] DATE OF BIRTH:Dec 30, 1965 FACILITY: [facility] LOCATION: [facility] PHYSICIAN:Oliverio Cho A. 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    PROCEDURES:  Right wrist first dorsal compartment release and tenosynovectomy and right thumb carpometacarpal arthroplasty with abductor pollicis longus tendon transfer.  SURGEON:  Artist PaisMatthew A. Mina MarbleWeingold, MD   ASSISTANT:  Annye Ruskobert Dasnoit, PA-C.  ANESTHESIA:  Regional with sedation.  COMPLICATIONS:  No complications  DRAINS:  No drains.  DESCRIPTION OF PROCEDURE:  The patient was taken to the operating suite after induction of regional anesthetic and IV sedation, the right upper extremity was prepped and draped in the usual sterile fashion.  An Esmarch was used to exsanguinate the limb  and the tourniquet was inflated to 250 mmHg.  At this point in time, incision was made over the dorsal radial aspect of the thumb CMC and first dorsal compartment area.  Skin was incised sharply and dissection was carried down to the first dorsal  compartment.  The first dorsal compartment was released at its dorsal most extent and the APL and EPB tendons were lysed all adhesions.  We then carefully dissected out the snuffbox artery and retracted it proximally.  We performed a CMC arthrotomy and  performed a subperiosteal dissection of the thumb metacarpal base and trapezium.  We removed the trapezium piecemeal using a combination of curettes, osteotomes, and a rongeur.  A complete CMC synovectomy was performed.  Next, we harvested the radial  most slip of the  abductor pollicis longus tendon group and transected it proximally at the musculotendinous junction.  We do this into the distal aspect of the wound.  Then, using the Arthrex 3.5 mm corkscrew anchor set we drilled holes in the thumb  metacarpal base and the index metacarpal base.  We then irrigated all debris from the wound.  We then passed the APL tendon transfer, which we sutured with 2-0 FiberWire suture from dorsal to volar out the thumb metacarpal base and fixed it with a 3.5 mm  corkscrew anchor.  We then passed the remaining tail of the APL tendon transfer from volar to dorsal at the index metacarpal base and fixed it with the appropriate 3.5 mm corkscrew anchor.  We then tied the remaining tail of the APL tendon to the  extensor carpi radialis brevis tendon dorsally.  The wounds were irrigated and loosely closed in layers of 4-0 Vicryl to close the capsule and a 3-0 Prolene subcuticular stitch on the skin.  Steri-Strips, 4 x 4, and a radial gutter splint was applied.    The patient tolerated this procedure well and went to recovery room in stable fashion.  AN/NUANCE  D:03/11/2018 T:03/11/2018 JOB:[id number]

## 2018-03-11 NOTE — Anesthesia Postprocedure Evaluation (Signed)
Anesthesia Post Note  Patient: Tabitha Scott  Procedure(s) Performed: RIGHT THUMB CARPOMETACARPEL ([place]) ARTHROPLASTY WITH ABDUCTOR POLLICIS LONGUS TRANSFER, RIGHT WRIST STENOSING TENOSYNOVITIS RELEASE (Right Thumb)     Patient location during evaluation: PACU Anesthesia Type: MAC and Regional Level of consciousness: awake and alert Pain management: pain level controlled Vital Signs Assessment: post-procedure vital signs reviewed and stable Respiratory status: spontaneous breathing, nonlabored ventilation, respiratory function stable and patient connected to nasal cannula oxygen Cardiovascular status: stable and blood pressure returned to baseline Postop Assessment: no apparent nausea or vomiting Anesthetic complications: no    Last Vitals:  Vitals:   03/11/18 0940 03/11/18 0952  BP: 127/77 (!) 151/96  Pulse: 74 72  Resp: 12 18  Temp: 36.6 C   SpO2: 95% 92%    Last Pain:  Vitals:   03/11/18 0952  TempSrc:   PainSc: 0-No pain                 Preciliano Castell

## 2018-03-11 NOTE — H&P (Signed)
Tabitha Scott is an 52 y.o. female.   Chief Complaint:progressive right thumb pain and swelling HPI: patient's a very pleasant 52 year old female with progressive right thumb CMC arthropathy as well as right wrist first dorsal compartment tenosynovitis despite conservative measures.  Past Medical History:  Diagnosis Date  . Arthritis   . Carpal tunnel syndrome   . Chronic left shoulder pain   . Chronic lower back pain   . COPD (chronic obstructive pulmonary disease) (HCC)    pt states she does not have COPD  . Depression   . GERD (gastroesophageal reflux disease)   . High cholesterol   . Hyperlipidemia   . Hypertension    no longer on medications - stopped taking 4 years ago  . Hypothyroidism   . Low iron   . Neuropathy   . On home oxygen therapy    "have it available; quit smoking; not using it" (04/16/2016)  . OSA (obstructive sleep apnea)    not using cpap  . Pneumonia    03/10/18 - "yeas ago"  . Thyroid disease   . Type II diabetes mellitus (HCC)    Pt was told she did not have diabetes, was treated at one time    Past Surgical History:  Procedure Laterality Date  . CARPAL TUNNEL RELEASE Bilateral 2009  . CHOLECYSTECTOMY    . COLONOSCOPY    . DILATION AND CURETTAGE OF UTERUS    . JOINT REPLACEMENT Left 2015   thumb  . KNEE SURGERY Left 1970s   tissue removed  . LACRIMAL DUCT EXPLORATION Right 12/09/2017   Procedure: DACROCYSTORHINOSTOMY WITH ANTERIOR ETHMOIDECTOMY AND PUNCTOPLASTY, PROBING AND STENT PLACEMENT RIGHT EYE;  Surgeon: Floydene Flock, MD;  Location: [facility] OR;  Service: Ophthalmology;  Laterality: Right;  . LAPAROSCOPIC CHOLECYSTECTOMY  ~ 2014  . NEUROPLASTY / TRANSPOSITION ULNAR NERVE AT ELBOW Bilateral 2009   nerver repair to both arms   . TUBAL LIGATION      Family History  Problem Relation Age of Onset  . COPD Mother   . Heart disease Father   . Cancer Maternal Grandmother    Social History:  reports that she quit smoking about 3 years  ago. Her smoking use included cigarettes. She has a 61.50 pack-year smoking history. She has never used smokeless tobacco. She reports that she does not drink alcohol or use drugs.  Allergies:  Allergies  Allergen Reactions  . Tetanus Toxoids Swelling    Medications Prior to Admission  Medication Sig Dispense Refill  . albuterol (PROVENTIL HFA;VENTOLIN HFA) 108 (90 Base) MCG/ACT inhaler Inhale 1-2 puffs into the lungs every 6 (six) hours as needed for wheezing or shortness of breath. 1 Inhaler 2  . atorvastatin (LIPITOR) 40 MG tablet Take 1 tablet (40 mg total) by mouth daily. 90 tablet 2  . Buprenorphine HCl (BELBUCA) 150 MCG FILM Place 1 mcg inside cheek daily.    . DULoxetine (CYMBALTA) 30 MG capsule Take 1 capsule (30 mg total) by mouth 2 (two) times daily. Take 1 tablet once daily for 7 days, then 2 tablets daily 180 capsule 2  . furosemide (LASIX) 20 MG tablet Take 1 tablet (20 mg total) by mouth daily. 90 tablet 3  . gabapentin (NEURONTIN) 300 MG capsule TAKE 300 MG BY MOUTH 4 TIMES DAILY 180 capsule 2  . HYDROcodone-acetaminophen (NORCO/VICODIN) 5-325 MG tablet Take 1-2 tablets by mouth every 6 (six) hours as needed. (Patient taking differently: Take 1-2 tablets by mouth every 6 (six) hours as needed for  moderate pain. ) 15 tablet 0  . levothyroxine (SYNTHROID, LEVOTHROID) 25 MCG tablet Take 1 tablet (25 mcg total) by mouth daily before breakfast. 90 tablet 2  . omeprazole (PRILOSEC) 40 MG capsule Take 1 capsule (40 mg total) by mouth 2 (two) times daily. 180 capsule 2  . spironolactone-hydrochlorothiazide (ALDACTAZIDE) 25-25 MG tablet Take 1 tablet by mouth daily. 30 tablet 11  . ibuprofen (ADVIL,MOTRIN) 600 MG tablet Take 1 tablet (600 mg total) by mouth every 6 (six) hours. (Patient not taking: Reported on 12/03/2017) 20 tablet 0  . potassium chloride (K-DUR,KLOR-CON) 10 MEQ tablet TAKE 1 TABLET BY MOUTH TWICE DAILY (Patient not taking: Reported on 12/03/2017) 6 tablet 0  . tiZANidine  (ZANAFLEX) 4 MG capsule Take 1 capsule (4 mg total) by mouth 3 (three) times daily as needed for muscle spasms. (Patient not taking: Reported on 12/03/2017) 10 capsule 0    No results found for this or any previous visit (from the past 48 hour(s)). No results found.  Review of Systems  All other systems reviewed and are negative.   Blood pressure (!) 122/56, pulse 88, temperature 98 F (36.7 C), temperature source Oral, resp. rate 20, height 5\' 4"  (1.626 m), weight (!) 150.1 kg (331 lb), SpO2 94 %. Physical Exam  Constitutional: She is oriented to person, place, and time. She appears well-developed and well-nourished.  HENT:  Scott: Normocephalic and atraumatic.  Neck: Normal range of motion.  Cardiovascular: Normal rate.  Respiratory: Effort normal.  Musculoskeletal:       Right hand: She exhibits tenderness, bony tenderness and swelling.  Right thumb carpometacarpal joint pain, swelling, and decreased range of motion consistent with osteoarthritis  Neurological: She is alert and oriented to person, place, and time.  Skin: Skin is warm.  Psychiatric: She has a normal mood and affect. Her behavior is normal. Judgment and thought content normal.     Assessment/Plan 52 year old female with progressive right thumb CMC arthropathy and right wrist first dorsal compartment tenosynovitis. Have discussed CMC arthroplasty with abductor pollicis longus tendon transfer and release of first dorsal compartment as an outpatient under regional anesthetic. Patient understands risks and benefits and wishes to proceed.  Marlowe ShoresMatthew A Maikayla Beggs, MD 03/11/2018, 7:00 AM

## 2018-03-11 NOTE — Transfer of Care (Signed)
Immediate Anesthesia Transfer of Care Note  Patient: Tabitha Scott  Procedure(s) Performed: RIGHT THUMB CARPOMETACARPEL (CMC) ARTHROPLASTY WITH ABDUCTOR POLLICIS LONGUS TRANSFER, RIGHT WRIST STENOSING TENOSYNOVITIS RELEASE (Right Thumb)  Patient Location: PACU  Anesthesia Type:Regional  Level of Consciousness: awake and alert   Airway & Oxygen Therapy: Patient Spontanous Breathing and Patient connected to nasal cannula oxygen  Post-op Assessment: Report given to RN and Post -op Vital signs reviewed and stable  Post vital signs: Reviewed and stable  Last Vitals:  Vitals Value Taken Time  BP 112/89 03/11/2018  8:55 AM  Temp    Pulse 80 03/11/2018  8:56 AM  Resp 15 03/11/2018  8:56 AM  SpO2 95 % 03/11/2018  8:56 AM  Vitals shown include unvalidated device data.  Last Pain:  Vitals:   03/11/18 0855  TempSrc:   PainSc: (P) 0-No pain      Patients Stated Pain Goal: 3 (03/11/18 [id number])  Complications: No apparent anesthesia complications

## 2018-03-11 NOTE — Anesthesia Preprocedure Evaluation (Signed)
Anesthesia Evaluation  Patient identified by MRN, date of birth, ID band Patient awake    Reviewed: Allergy & Precautions, NPO status , Patient's Chart, lab work & pertinent test results  History of Anesthesia Complications Negative for: history of anesthetic complications  Airway Mallampati: III  TM Distance: >3 FB Neck ROM: Full    Dental  (+) Dental Advisory Given   Pulmonary sleep apnea , COPD,  COPD inhaler, former smoker,    breath sounds clear to auscultation       Cardiovascular hypertension, Pt. on medications  Rhythm:Regular     Neuro/Psych PSYCHIATRIC DISORDERS Depression  Neuromuscular disease    GI/Hepatic Neg liver ROS, GERD  Medicated and Controlled,  Endo/Other  diabetesHypothyroidism Morbid obesity  Renal/GU Renal InsufficiencyRenal disease     Musculoskeletal  (+) Arthritis ,   Abdominal   Peds  Hematology  (+) anemia ,   Anesthesia Other Findings   Reproductive/Obstetrics                             Anesthesia Physical Anesthesia Plan  ASA: III  Anesthesia Plan: MAC and Regional   Post-op Pain Management:    Induction:   PONV Risk Score and Plan: 2 and Treatment may vary due to age or medical condition  Airway Management Planned: Nasal Cannula  Additional Equipment: None  Intra-op Plan:   Post-operative Plan:   Informed Consent: I have reviewed the patients History and Physical, chart, labs and discussed the procedure including the risks, benefits and alternatives for the proposed anesthesia with the patient or authorized representative who has indicated his/her understanding and acceptance.   Dental advisory given  Plan Discussed with: CRNA and Surgeon  Anesthesia Plan Comments:         Anesthesia Quick Evaluation

## 2018-03-11 NOTE — Op Note (Signed)
Please see dictated report [phone]

## 2018-03-14 ENCOUNTER — Encounter (HOSPITAL_COMMUNITY): Payer: Self-pay | Admitting: Orthopedic Surgery

## 2018-03-21 ENCOUNTER — Encounter (HOSPITAL_COMMUNITY): Payer: Self-pay | Admitting: Orthopedic Surgery

## 2018-03-21 MED ORDER — ROPIVACAINE HCL 7.5 MG/ML IJ SOLN
INTRAMUSCULAR | Status: DC | PRN
  Administered 2018-03-21: 20 mL via PERINEURAL

## 2018-03-21 NOTE — Addendum Note (Signed)
Addendum  created 03/21/18 1346 by Val EagleMoser, Kirstina Leinweber, MD   Child order released for a procedure order, Intraprocedure Blocks edited, Intraprocedure Meds edited, Sign clinical note

## 2018-03-21 NOTE — Anesthesia Procedure Notes (Signed)
Anesthesia Regional Block: Supraclavicular block   Pre-Anesthetic Checklist: ,, timeout performed, Correct Patient, Correct Site, Correct Laterality, Correct Procedure, Correct Position, site marked, Risks and benefits discussed,  Surgical consent,  Pre-op evaluation,  At surgeon's request and post-op pain management  Laterality: Upper and Right  Prep: chloraprep       Needles:  Injection technique: Single-shot  Needle Type: Echogenic Needle          Additional Needles:   Procedures:,,,, ultrasound used (permanent image in chart),,,,  Narrative:  Start time: 03/21/2018 7:16 AM End time: 03/21/2018 7:20 AM Injection made incrementally with aspirations every 5 mL.  Performed by: Personally   Additional Notes: H+P and labs reviewed, risks and benefits discussed with patient, procedure tolerated well without complications

## 2018-03-24 ENCOUNTER — Ambulatory Visit: Payer: Medicaid Other | Admitting: Family Medicine

## 2018-03-28 ENCOUNTER — Ambulatory Visit: Payer: Medicaid Other | Admitting: Family Medicine

## 2018-03-30 ENCOUNTER — Ambulatory Visit: Payer: Medicaid Other | Admitting: Family Medicine

## 2018-04-01 ENCOUNTER — Ambulatory Visit: Payer: Medicaid Other | Admitting: Family Medicine

## 2018-04-06 ENCOUNTER — Emergency Department (HOSPITAL_COMMUNITY)
Admission: EM | Admit: 2018-04-06 | Discharge: 2018-04-06 | Disposition: A | Discharge status: Home / Self Care | Payer: Medicaid Other | Attending: Emergency Medicine | Admitting: Emergency Medicine

## 2018-04-06 ENCOUNTER — Other Ambulatory Visit: Payer: Self-pay

## 2018-04-06 ENCOUNTER — Encounter (HOSPITAL_COMMUNITY): Payer: Self-pay

## 2018-04-06 ENCOUNTER — Emergency Department (HOSPITAL_COMMUNITY): Payer: Medicaid Other

## 2018-04-06 DIAGNOSIS — N189 Chronic kidney disease, unspecified: Secondary | ICD-10-CM | POA: Diagnosis not present

## 2018-04-06 DIAGNOSIS — J449 Chronic obstructive pulmonary disease, unspecified: Secondary | ICD-10-CM | POA: Diagnosis not present

## 2018-04-06 DIAGNOSIS — E1122 Type 2 diabetes mellitus with diabetic chronic kidney disease: Secondary | ICD-10-CM | POA: Diagnosis not present

## 2018-04-06 DIAGNOSIS — E039 Hypothyroidism, unspecified: Secondary | ICD-10-CM | POA: Diagnosis not present

## 2018-04-06 DIAGNOSIS — Z87891 Personal history of nicotine dependence: Secondary | ICD-10-CM | POA: Diagnosis not present

## 2018-04-06 DIAGNOSIS — M25572 Pain in left ankle and joints of left foot: Secondary | ICD-10-CM | POA: Insufficient documentation

## 2018-04-06 DIAGNOSIS — Z79899 Other long term (current) drug therapy: Secondary | ICD-10-CM | POA: Insufficient documentation

## 2018-04-06 DIAGNOSIS — I129 Hypertensive chronic kidney disease with stage 1 through stage 4 chronic kidney disease, or unspecified chronic kidney disease: Secondary | ICD-10-CM | POA: Diagnosis not present

## 2018-04-06 MED ORDER — PREDNISONE 10 MG PO TABS: 30.0000 mg | ORAL_TABLET | Freq: Every day | ORAL | 0 refills | Status: AC

## 2018-04-06 MED ORDER — IBUPROFEN 400 MG PO TABS
400.0000 mg | ORAL_TABLET | Freq: Once | ORAL | Status: AC | PRN
  Administered 2018-04-06: 400 mg via ORAL
  Filled 2018-04-06: qty 1

## 2018-04-06 NOTE — ED Triage Notes (Signed)
Pt c.o left ankle pain that she woke up to this morning. Denies any injury, slight swelling, pulses palpable. Pain 10/10

## 2018-04-06 NOTE — Discharge Instructions (Addendum)
Please take Ibuprofen (Advil, motrin) and Tylenol (acetaminophen) to relieve your pain.  You may take up to 400 MG (2 pills) of normal strength ibuprofen every 8 hours as needed.  In between doses of ibuprofen you make take tylenol, up to 1,000 mg (two extra strength pills).  Do not take more than 3,000 mg tylenol in a 24 hour period.  Please check all medication labels as many medications such as pain and cold medications may contain tylenol.  Do not drink alcohol while taking these medications.  Do not take other NSAID'S while taking ibuprofen (such as aleve or naproxen).  Please take ibuprofen with food to decrease stomach upset.

## 2018-04-06 NOTE — ED Notes (Signed)
Pt verbalized understanding of discharge instructions and denies any further questions at this time.   

## 2018-04-06 NOTE — ED Notes (Signed)
ED Provider at bedside. 

## 2018-04-06 NOTE — ED Provider Notes (Signed)
[facility] EMERGENCY DEPARTMENT Provider Note   CSN: [id number] Arrival date & time: 04/06/18  1054     History   Chief Complaint Chief Complaint  Patient presents with  . Ankle Pain    HPI Tabitha Scott is a 52 y.o. female with past medical history of hypertension, morbid obesity, chronic pains, thyroid disease, diastolic heart failure, who presents today for evaluation of left-sided ankle pain.  She denies any injury, states that she woke up this morning with pain in her left ankle and swelling.  She denies any history of previous or gout.  Denies any possibility of injuring this ankle.  She is able to bear weight, however it is extremely painful.  HPI  Past Medical History:  Diagnosis Date  . Arthritis   . Carpal tunnel syndrome   . Chronic left shoulder pain   . Chronic lower back pain   . COPD (chronic obstructive pulmonary disease) (HCC)    pt states she does not have COPD  . Depression   . GERD (gastroesophageal reflux disease)   . High cholesterol   . Hyperlipidemia   . Hypertension    no longer on medications - stopped taking 4 years ago  . Hypothyroidism   . Low iron   . Neuropathy   . On home oxygen therapy    "have it available; quit smoking; not using it" (04/16/2016)  . OSA (obstructive sleep apnea)    not using cpap  . Pneumonia    03/10/18 - "yeas ago"  . Thyroid disease   . Type II diabetes mellitus (HCC)    Pt was told she did not have diabetes, was treated at one time    Patient Active Problem List   Diagnosis Date Noted  . Bilateral primary osteoarthritis of knee 07/08/2017  . Essential hypertension 01/28/2017  . Dyspnea on exertion 12/14/2016  . Diastolic dysfunction 12/14/2016  . COPD GOLD 0  09/14/2016  . Esophageal reflux   . Pulmonary emphysema (HCC)   . Pain in the chest 04/15/2016  . Anemia 04/15/2016  . Hypokalemia 10/18/2015  . ARF (acute renal failure) (HCC) 10/18/2015  . Cough   . Acute respiratory  failure with hypoxia (HCC)   . OSA on CPAP   . Morbid (severe) obesity due to excess calories (HCC)   . Diabetes mellitus type 2, noninsulin dependent (HCC)   . Hypothyroidism 04/09/2015  . Pedal edema 04/09/2015  . DDD (degenerative disc disease), lumbar 04/09/2015  . Pure hypercholesterolemia 04/09/2015    Past Surgical History:  Procedure Laterality Date  . CARPAL TUNNEL RELEASE Bilateral 2009  . CHOLECYSTECTOMY    . COLONOSCOPY    . DILATION AND CURETTAGE OF UTERUS    . JOINT REPLACEMENT Left 2015   thumb  . KNEE SURGERY Left 1970s   tissue removed  . LACRIMAL DUCT EXPLORATION Right 12/09/2017   Procedure: DACROCYSTORHINOSTOMY WITH ANTERIOR ETHMOIDECTOMY AND PUNCTOPLASTY, PROBING AND STENT PLACEMENT RIGHT EYE;  Surgeon: Floydene Flock, MD;  Location: [facility] OR;  Service: Ophthalmology;  Laterality: Right;  . LAPAROSCOPIC CHOLECYSTECTOMY  ~ 2014  . NEUROPLASTY / TRANSPOSITION ULNAR NERVE AT ELBOW Bilateral 2009   nerver repair to both arms   . TUBAL LIGATION    . WRIST ARTHROSCOPY WITH CARPOMETACARPEL [facility]) ARTHROPLASTY Right 03/11/2018   Procedure: RIGHT THUMB CARPOMETACARPEL ([facility]) ARTHROPLASTY WITH ABDUCTOR POLLICIS LONGUS TRANSFER, RIGHT WRIST STENOSING TENOSYNOVITIS RELEASE;  Surgeon: Dairl Ponder, MD;  Location: [facility] OR;  Service: Orthopedics;  Laterality: Right;  OB History   None      Home Medications    Prior to Admission medications   Medication Sig Start Date End Date Taking? Authorizing Provider  albuterol (PROVENTIL HFA;VENTOLIN HFA) 108 (90 Base) MCG/ACT inhaler Inhale 1-2 puffs into the lungs every 6 (six) hours as needed for wheezing or shortness of breath. 07/31/17   Bing NeighborsHarris, Cameren S, FNP  atorvastatin (LIPITOR) 40 MG tablet Take 1 tablet (40 mg total) by mouth daily. 12/22/17   Massie MaroonHollis, Lachina M, FNP  Buprenorphine HCl (BELBUCA) 150 MCG FILM Place 1 mcg inside cheek daily.    [provider]  DULoxetine (CYMBALTA) 30 MG capsule Take 1 capsule  (30 mg total) by mouth 2 (two) times daily. Take 1 tablet once daily for 7 days, then 2 tablets daily 12/22/17   Massie MaroonHollis, Lachina M, FNP  furosemide (LASIX) 20 MG tablet Take 1 tablet (20 mg total) by mouth daily. 12/22/17   Massie MaroonHollis, Lachina M, FNP  gabapentin (NEURONTIN) 300 MG capsule TAKE 300 MG BY MOUTH 4 TIMES DAILY 12/22/17   Massie MaroonHollis, Lachina M, FNP  HYDROcodone-acetaminophen (NORCO/VICODIN) 5-325 MG tablet Take 1-2 tablets by mouth every 6 (six) hours as needed. Patient taking differently: Take 1-2 tablets by mouth every 6 (six) hours as needed for moderate pain.  11/25/17   Rolan BuccoBelfi, Melanie, MD  ibuprofen (ADVIL,MOTRIN) 600 MG tablet Take 1 tablet (600 mg total) by mouth every 6 (six) hours. Patient not taking: Reported on 12/03/2017 12/21/16   Felicie MornSmith, David, NP  levothyroxine (SYNTHROID, LEVOTHROID) 25 MCG tablet Take 1 tablet (25 mcg total) by mouth daily before breakfast. 09/23/16   Henrietta HooverBernhardt, Linda C, NP  omeprazole (PRILOSEC) 40 MG capsule Take 1 capsule (40 mg total) by mouth 2 (two) times daily. 12/22/17   Massie MaroonHollis, Lachina M, FNP  potassium chloride (K-DUR,KLOR-CON) 10 MEQ tablet TAKE 1 TABLET BY MOUTH TWICE DAILY Patient not taking: Reported on 12/03/2017 05/11/17   Bing NeighborsHarris, Lanijah S, FNP  predniSONE (DELTASONE) 10 MG tablet Take 3 tablets (30 mg total) by mouth daily for 3 days. 04/06/18 04/09/18  Cristina GongHammond, Kennedi Lizardo W, PA-C  spironolactone-hydrochlorothiazide (ALDACTAZIDE) 25-25 MG tablet Take 1 tablet by mouth daily. 12/22/17   Massie MaroonHollis, Lachina M, FNP  tiZANidine (ZANAFLEX) 4 MG capsule Take 1 capsule (4 mg total) by mouth 3 (three) times daily as needed for muscle spasms. Patient not taking: Reported on 12/03/2017 07/31/17   Bing NeighborsHarris, Ghina S, FNP    Family History Family History  Problem Relation Age of Onset  . COPD Mother   . Heart disease Father   . Cancer Maternal Grandmother     Social History Social History   Tobacco Use  . Smoking status: Former Smoker    Packs/day: 1.50    Years:  41.00    Pack years: 61.50    Types: Cigarettes    Last attempt to quit: 02/11/2015    Years since quitting: 3.1  . Smokeless tobacco: Never Used  Substance Use Topics  . Alcohol use: No    Alcohol/week: 0.0 standard drinks  . Drug use: No     Allergies   Tetanus toxoids   Review of Systems Review of Systems  Musculoskeletal: Positive for arthralgias and joint swelling. Negative for back pain, neck pain and neck stiffness.  Skin: Negative for color change.  All other systems reviewed and are negative.    Physical Exam Updated Vital Signs BP 137/74 (BP Location: Left Arm)   Pulse 80   Temp 98.4 F (36.9 C) (Oral)  Resp 16   Ht 5\' 4"  (1.626 m)   Wt (!) 145.2 kg   SpO2 96%   BMI 54.93 kg/m   Physical Exam  Constitutional: She is oriented to person, place, and time.  Cardiovascular: Intact distal pulses.  Left-sided 2+ DP/PT pulses.  Musculoskeletal:  Left ankle is questionably swollen.  When compared to right ankle no obvious swelling.  Bilateral ankles are both large. ROM in left ankle limited secondary to pain.  There is no TTP over remainder of L. lower leg.   Neurological: She is alert and oriented to person, place, and time.  Skin: Skin is warm.  Area of warmth and slight redness over left lateral ankle/foot.    Nursing note and vitals reviewed.    ED Treatments / Results  Labs (all labs ordered are listed, but only abnormal results are displayed) Labs Reviewed - No data to display  EKG None  Radiology Dg Ankle Complete Left  Result Date: 04/06/2018 CLINICAL DATA:  Awoke with swelling and ankle pain. EXAM: LEFT ANKLE COMPLETE - 3+ VIEW COMPARISON:  None. FINDINGS: Generalized soft tissue swelling. Negative for fracture or dislocation. No erosion or joint narrowing. IMPRESSION: Soft tissue swelling without osseous abnormality. Electronically Signed   By: Marnee SpringJonathon  Watts M.D.   On: 04/06/2018 13:19    Procedures Procedures (including critical care  time)  Medications Ordered in ED Medications  ibuprofen (ADVIL,MOTRIN) tablet 400 mg (400 mg Oral Given 04/06/18 1123)     Initial Impression / Assessment and Plan / ED Course  I have reviewed the triage vital signs and the nursing notes.  Pertinent labs & imaging results that were available during my care of the patient were reviewed by me and considered in my medical decision making (see chart for details).    Haynes KernsKimberly Ann Gelinas presents today for evaluation of atraumatic left-sided ankle pain.  She is afebrile here, she is able to bear weight however states that it is very painful.  She has never had gout before.  There are no wounds over the ankle, based on the atraumatic nature and patient's history of multiple comorbid conditions suspect that this is most likely gout.  This was discussed with Dr. Particia NearingHaviland.  As patient has a history of kidney disease hesitant to treat with high-dose NSAIDs, will give low-dose steroids as chart review shows her last A1c was normal.  OTC ibuprofen Tylenol as needed.  Return precautions were discussed with patient who states their understanding.  At the time of discharge patient denied any unaddressed complaints or concerns.  Patient is agreeable for discharge home.    Final Clinical Impressions(s) / ED Diagnoses   Final diagnoses:  Acute left ankle pain    ED Discharge Orders         Ordered    predniSONE (DELTASONE) 10 MG tablet  Daily     04/06/18 1430           Cristina GongHammond, Yoona Ishii W, [place]PA-C 04/06/18 1708    Jacalyn LefevreHaviland, Julie, MD 04/09/18 1323

## 2018-04-15 ENCOUNTER — Telehealth: Payer: Self-pay

## 2018-04-15 NOTE — Telephone Encounter (Signed)
Left a vm for patient to callback and schedule appointment 

## 2018-04-15 NOTE — Telephone Encounter (Signed)
-----   Message from Kallie LocksNatalie M Stroud, FNP sent at 04/14/2018  9:25 PM EDT ----- Regarding: "Follow Up"  Karren Burlyhandra,  Please schedule patient for follow up within the next month.    Thank you.

## 2018-04-26 ENCOUNTER — Ambulatory Visit: Payer: Medicaid Other | Admitting: Family Medicine

## 2018-05-03 ENCOUNTER — Ambulatory Visit (INDEPENDENT_AMBULATORY_CARE_PROVIDER_SITE_OTHER): Payer: Medicaid Other | Admitting: Family Medicine

## 2018-05-03 ENCOUNTER — Encounter: Payer: Self-pay | Admitting: Family Medicine

## 2018-05-03 VITALS — BP 144/82 | HR 90 | Temp 98.2°F | Ht 64.0 in | Wt 326.0 lb

## 2018-05-03 DIAGNOSIS — Z23 Encounter for immunization: Secondary | ICD-10-CM | POA: Diagnosis not present

## 2018-05-03 DIAGNOSIS — I5189 Other ill-defined heart diseases: Secondary | ICD-10-CM

## 2018-05-03 DIAGNOSIS — M109 Gout, unspecified: Secondary | ICD-10-CM

## 2018-05-03 DIAGNOSIS — Z09 Encounter for follow-up examination after completed treatment for conditions other than malignant neoplasm: Secondary | ICD-10-CM

## 2018-05-03 DIAGNOSIS — E66813 Obesity, class 3: Secondary | ICD-10-CM

## 2018-05-03 DIAGNOSIS — R600 Localized edema: Secondary | ICD-10-CM

## 2018-05-03 DIAGNOSIS — Z0000 Encounter for general adult medical examination without abnormal findings: Secondary | ICD-10-CM

## 2018-05-03 DIAGNOSIS — G629 Polyneuropathy, unspecified: Secondary | ICD-10-CM

## 2018-05-03 DIAGNOSIS — E785 Hyperlipidemia, unspecified: Secondary | ICD-10-CM

## 2018-05-03 DIAGNOSIS — R7303 Prediabetes: Secondary | ICD-10-CM | POA: Diagnosis not present

## 2018-05-03 DIAGNOSIS — Z6841 Body Mass Index (BMI) 40.0 and over, adult: Secondary | ICD-10-CM

## 2018-05-03 DIAGNOSIS — K219 Gastro-esophageal reflux disease without esophagitis: Secondary | ICD-10-CM

## 2018-05-03 LAB — POCT URINALYSIS DIP (MANUAL ENTRY)
Bilirubin, UA: NEGATIVE
Glucose, UA: NEGATIVE mg/dL
Ketones, POC UA: NEGATIVE mg/dL
Nitrite, UA: NEGATIVE
Protein Ur, POC: NEGATIVE mg/dL
Spec Grav, UA: 1.015 (ref 1.010–1.025)
Urobilinogen, UA: 1 E.U./dL
pH, UA: 7.5 (ref 5.0–8.0)

## 2018-05-03 LAB — POCT GLYCOSYLATED HEMOGLOBIN (HGB A1C): Hemoglobin A1C: 5.8 % — AB (ref 4.0–5.6)

## 2018-05-03 MED ORDER — ALBUTEROL SULFATE HFA 108 (90 BASE) MCG/ACT IN AERS: 1.0000 | INHALATION_SPRAY | Freq: Four times a day (QID) | RESPIRATORY_TRACT | 11 refills | Status: DC | PRN

## 2018-05-03 MED ORDER — TIZANIDINE HCL 4 MG PO CAPS: 4.0000 mg | ORAL_CAPSULE | Freq: Three times a day (TID) | ORAL | 3 refills | Status: DC | PRN

## 2018-05-03 MED ORDER — FUROSEMIDE 20 MG PO TABS: 20.0000 mg | ORAL_TABLET | Freq: Every day | ORAL | 3 refills | Status: DC

## 2018-05-03 MED ORDER — COLCHICINE 0.6 MG PO TABS: 0.6000 mg | ORAL_TABLET | Freq: Two times a day (BID) | ORAL | 3 refills | Status: DC

## 2018-05-03 MED ORDER — LEVOTHYROXINE SODIUM 25 MCG PO TABS: 25.0000 ug | ORAL_TABLET | Freq: Every day | ORAL | 3 refills | Status: DC

## 2018-05-03 MED ORDER — OMEPRAZOLE 40 MG PO CPDR: 40.0000 mg | DELAYED_RELEASE_CAPSULE | Freq: Two times a day (BID) | ORAL | 3 refills | Status: DC

## 2018-05-03 MED ORDER — SPIRONOLACTONE-HCTZ 25-25 MG PO TABS: 1.0000 | ORAL_TABLET | Freq: Every day | ORAL | 3 refills | Status: DC

## 2018-05-03 MED ORDER — GABAPENTIN 300 MG PO CAPS: ORAL_CAPSULE | ORAL | 3 refills | Status: DC

## 2018-05-03 MED ORDER — DULOXETINE HCL 30 MG PO CPEP: 30.0000 mg | ORAL_CAPSULE | Freq: Two times a day (BID) | ORAL | 3 refills | Status: DC

## 2018-05-03 MED ORDER — ATORVASTATIN CALCIUM 40 MG PO TABS: 40.0000 mg | ORAL_TABLET | Freq: Every day | ORAL | 3 refills | Status: DC

## 2018-05-03 NOTE — Patient Instructions (Addendum)
Gout Gout is painful swelling that can happen in some of your joints. Gout is a type of arthritis. This condition is caused by having too much uric acid in your body. Uric acid is a chemical that is made when your body breaks down substances called purines. If your body has too much uric acid, sharp crystals can form and build up in your joints. This causes pain and swelling. Gout attacks can happen quickly and be very painful (acute gout). Over time, the attacks can affect more joints and happen more often (chronic gout). Follow these instructions at home: During a Gout Attack  If directed, put ice on the painful area: ? Put ice in a plastic bag. ? Place a towel between your skin and the bag. ? Leave the ice on for 20 minutes, 2-3 times a day.  Rest the joint as much as possible. If the joint is in your leg, you may be given crutches to use.  Raise (elevate) the painful joint above the level of your heart as often as you can.  Drink enough fluids to keep your pee (urine) clear or pale yellow.  Take over-the-counter and prescription medicines only as told by your doctor.  Do not drive or use heavy machinery while taking prescription pain medicine.  Follow instructions from your doctor about what you can or cannot eat and drink.  Return to your normal activities as told by your doctor. Ask your doctor what activities are safe for you. Avoiding Future Gout Attacks  Follow a low-purine diet as told by a specialist (dietitian) or your doctor. Avoid foods and drinks that have a lot of purines, such as: ? Liver. ? Kidney. ? Anchovies. ? Asparagus. ? Herring. ? Mushrooms ? Mussels. ? Beer.  Limit alcohol intake to no more than 1 drink a day for nonpregnant women and 2 drinks a day for men. One drink equals 12 oz of beer, 5 oz of wine, or 1 oz of hard liquor.  Stay at a healthy weight or lose weight if you are overweight. If you want to lose weight, talk with your doctor. It is  important that you do not lose weight too fast.  Start or continue an exercise plan as told by your doctor.  Drink enough fluids to keep your pee clear or pale yellow.  Take over-the-counter and prescription medicines only as told by your doctor.  Keep all follow-up visits as told by your doctor. This is important. Contact a doctor if:  You have another gout attack.  You still have symptoms of a gout attack after10 days of treatment.  You have problems (side effects) because of your medicines.  You have chills or a fever.  You have burning pain when you pee (urinate).  You have pain in your lower back or belly. Get help right away if:  You have very bad pain.  Your pain cannot be controlled.  You cannot pee. This information is not intended to replace advice given to you by your health care provider. Make sure you discuss any questions you have with your health care provider. Document Released: 05/19/2008 Document Revised: 01/16/2016 Document Reviewed: 05/23/2015 Elsevier Interactive Patient Education  2018 [employer].   DASH Eating Plan DASH stands for "Dietary Approaches to Stop Hypertension." The DASH eating plan is a healthy eating plan that has been shown to reduce high blood pressure (hypertension). It may also reduce your risk for type 2 diabetes, heart disease, and stroke. The DASH eating plan  may also help with weight loss. What are tips for following this plan? General guidelines  Avoid eating more than 2,300 mg (milligrams) of salt (sodium) a day. If you have hypertension, you may need to reduce your sodium intake to 1,500 mg a day.  Limit alcohol intake to no more than 1 drink a day for nonpregnant women and 2 drinks a day for men. One drink equals 12 oz of beer, 5 oz of wine, or 1 oz of hard liquor.  Work with your health care provider to maintain a healthy body weight or to lose weight. Ask what an ideal weight is for you.  Get at least 30 minutes of  exercise that causes your heart to beat faster (aerobic exercise) most days of the week. Activities may include walking, swimming, or biking.  Work with your health care provider or diet and nutrition specialist (dietitian) to adjust your eating plan to your individual calorie needs. Reading food labels  Check food labels for the amount of sodium per serving. Choose foods with less than 5 percent of the Daily Value of sodium. Generally, foods with less than 300 mg of sodium per serving fit into this eating plan.  To find whole grains, look for the word "whole" as the first word in the ingredient list. Shopping  Buy products labeled as "low-sodium" or "no salt added."  Buy fresh foods. Avoid canned foods and premade or frozen meals. Cooking  Avoid adding salt when cooking. Use salt-free seasonings or herbs instead of table salt or sea salt. Check with your health care provider or pharmacist before using salt substitutes.  Do not fry foods. Cook foods using healthy methods such as baking, boiling, grilling, and broiling instead.  Cook with heart-healthy oils, such as olive, canola, soybean, or sunflower oil. Meal planning   Eat a balanced diet that includes: ? 5 or more servings of fruits and vegetables each day. At each meal, try to fill half of your plate with fruits and vegetables. ? Up to 6-8 servings of whole grains each day. ? Less than 6 oz of lean meat, poultry, or fish each day. A 3-oz serving of meat is about the same size as a deck of cards. One egg equals 1 oz. ? 2 servings of low-fat dairy each day. ? A serving of nuts, seeds, or beans 5 times each week. ? Heart-healthy fats. Healthy fats called Omega-3 fatty acids are found in foods such as flaxseeds and coldwater fish, like sardines, salmon, and mackerel.  Limit how much you eat of the following: ? Canned or prepackaged foods. ? Food that is high in trans fat, such as fried foods. ? Food that is high in saturated fat,  such as fatty meat. ? Sweets, desserts, sugary drinks, and other foods with added sugar. ? Full-fat dairy products.  Do not salt foods before eating.  Try to eat at least 2 vegetarian meals each week.  Eat more home-cooked food and less restaurant, buffet, and fast food.  When eating at a restaurant, ask that your food be prepared with less salt or no salt, if possible. What foods are recommended? The items listed may not be a complete list. Talk with your dietitian about what dietary choices are best for you. Grains Whole-grain or whole-wheat bread. Whole-grain or whole-wheat pasta. Brown rice. Orpah Cobb. Bulgur. Whole-grain and low-sodium cereals. Pita bread. Low-fat, low-sodium crackers. Whole-wheat flour tortillas. Vegetables Fresh or frozen vegetables (raw, steamed, roasted, or grilled). Low-sodium or reduced-sodium tomato and  vegetable juice. Low-sodium or reduced-sodium tomato sauce and tomato paste. Low-sodium or reduced-sodium canned vegetables. Fruits All fresh, dried, or frozen fruit. Canned fruit in natural juice (without added sugar). Meat and other protein foods Skinless chicken or [place]. Ground chicken or [place]. Pork with fat trimmed off. Fish and seafood. Egg whites. Dried beans, peas, or lentils. Unsalted nuts, nut butters, and seeds. Unsalted canned beans. Lean cuts of beef with fat trimmed off. Low-sodium, lean deli meat. Dairy Low-fat (1%) or fat-free (skim) milk. Fat-free, low-fat, or reduced-fat cheeses. Nonfat, low-sodium ricotta or cottage cheese. Low-fat or nonfat yogurt. Low-fat, low-sodium cheese. Fats and oils Soft margarine without trans fats. Vegetable oil. Low-fat, reduced-fat, or light mayonnaise and salad dressings (reduced-sodium). Canola, safflower, olive, soybean, and sunflower oils. Avocado. Seasoning and other foods Herbs. Spices. Seasoning mixes without salt. Unsalted popcorn and pretzels. Fat-free sweets. What foods are not recommended? The  items listed may not be a complete list. Talk with your dietitian about what dietary choices are best for you. Grains Baked goods made with fat, such as croissants, muffins, or some breads. Dry pasta or rice meal packs. Vegetables Creamed or fried vegetables. Vegetables in a cheese sauce. Regular canned vegetables (not low-sodium or reduced-sodium). Regular canned tomato sauce and paste (not low-sodium or reduced-sodium). Regular tomato and vegetable juice (not low-sodium or reduced-sodium). Rosita Fire. Olives. Fruits Canned fruit in a light or heavy syrup. Fried fruit. Fruit in cream or butter sauce. Meat and other protein foods Fatty cuts of meat. Ribs. Fried meat. Tomasa Blase. Sausage. Bologna and other processed lunch meats. Salami. Fatback. Hotdogs. Bratwurst. Salted nuts and seeds. Canned beans with added salt. Canned or smoked fish. Whole eggs or egg yolks. Chicken or [place] with skin. Dairy Whole or 2% milk, cream, and half-and-half. Whole or full-fat cream cheese. Whole-fat or sweetened yogurt. Full-fat cheese. Nondairy creamers. Whipped toppings. Processed cheese and cheese spreads. Fats and oils Butter. Stick margarine. Lard. Shortening. Ghee. Bacon fat. Tropical oils, such as coconut, palm kernel, or palm oil. Seasoning and other foods Salted popcorn and pretzels. Onion salt, garlic salt, seasoned salt, table salt, and sea salt. Worcestershire sauce. Tartar sauce. Barbecue sauce. Teriyaki sauce. Soy sauce, including reduced-sodium. Steak sauce. Canned and packaged gravies. Fish sauce. Oyster sauce. Cocktail sauce. Horseradish that you find on the shelf. Ketchup. Mustard. Meat flavorings and tenderizers. Bouillon cubes. Hot sauce and Tabasco sauce. Premade or packaged marinades. Premade or packaged taco seasonings. Relishes. Regular salad dressings. Where to find more information:  National Heart, Lung, and Blood Institute: PopSteam.is  American Heart Association:  www.heart.org Summary  The DASH eating plan is a healthy eating plan that has been shown to reduce high blood pressure (hypertension). It may also reduce your risk for type 2 diabetes, heart disease, and stroke.  With the DASH eating plan, you should limit salt (sodium) intake to 2,300 mg a day. If you have hypertension, you may need to reduce your sodium intake to 1,500 mg a day.  When on the DASH eating plan, aim to eat more fresh fruits and vegetables, whole grains, lean proteins, low-fat dairy, and heart-healthy fats.  Work with your health care provider or diet and nutrition specialist (dietitian) to adjust your eating plan to your individual calorie needs. This information is not intended to replace advice given to you by your health care provider. Make sure you discuss any questions you have with your health care provider. Document Released: 07/30/2011 Document Revised: 08/03/2016 Document Reviewed: 08/03/2016 Elsevier Interactive Patient Education  2018 Else[employer]  Inc[employer]  Heart-Healthy Eating Plan Heart-healthy meal planning includes:  Limiting unhealthy fats.  Increasing healthy fats.  Making other small dietary changes.  You may need to talk with your doctor or a diet specialist (dietitian) to create an eating plan that is right for you. What types of fat should I choose?  Choose healthy fats. These include olive oil and canola oil, flaxseeds, walnuts, almonds, and seeds.  Eat more omega-3 fats. These include salmon, mackerel, sardines, tuna, flaxseed oil, and ground flaxseeds. Try to eat fish at least twice each week.  Limit saturated fats. ? Saturated fats are often found in animal products, such as meats, butter, and cream. ? Plant sources of saturated fats include palm oil, palm kernel oil, and coconut oil.  Avoid foods with partially hydrogenated oils in them. These include stick margarine, some tub margarines, cookies, crackers, and other baked goods. These contain  trans fats. What general guidelines do I need to follow?  Check food labels carefully. Identify foods with trans fats or high amounts of saturated fat.  Fill one half of your plate with vegetables and green salads. Eat 4-5 servings of vegetables per day. A serving of vegetables is: ? 1 cup of raw leafy vegetables. ?  cup of raw or cooked cut-up vegetables. ?  cup of vegetable juice.  Fill one fourth of your plate with whole grains. Look for the word "whole" as the first word in the ingredient list.  Fill one fourth of your plate with lean protein foods.  Eat 4-5 servings of fruit per day. A serving of fruit is: ? One medium whole fruit. ?  cup of dried fruit. ?  cup of fresh, frozen, or canned fruit. ?  cup of 100% fruit juice.  Eat more foods that contain soluble fiber. These include apples, broccoli, carrots, beans, peas, and barley. Try to get 20-30 g of fiber per day.  Eat more home-cooked food. Eat less restaurant, buffet, and fast food.  Limit or avoid alcohol.  Limit foods high in starch and sugar.  Avoid fried foods.  Avoid frying your food. Try baking, boiling, grilling, or broiling it instead. You can also reduce fat by: ? Removing the skin from poultry. ? Removing all visible fats from meats. ? Skimming the fat off of stews, soups, and gravies before serving them. ? Steaming vegetables in water or broth.  Lose weight if you are overweight.  Eat 4-5 servings of nuts, legumes, and seeds per week: ? One serving of dried beans or legumes equals  cup after being cooked. ? One serving of nuts equals 1 ounces. ? One serving of seeds equals  ounce or one tablespoon.  You may need to keep track of how much salt or sodium you eat. This is especially true if you have high blood pressure. Talk with your doctor or dietitian to get more information. What foods can I eat? Grains Breads, including [place], white, pita, wheat, raisin, rye, oatmeal, and [place].  Tortillas that are neither fried nor made with lard or trans fat. Low-fat rolls, including hotdog and hamburger buns and English muffins. Biscuits. Muffins. Waffles. Pancakes. Light popcorn. Whole-grain cereals. Flatbread. Melba toast. Pretzels. Breadsticks. Rusks. Low-fat snacks. Low-fat crackers, including oyster, saltine, matzo, graham, animal, and rye. Rice and pasta, including brown rice and pastas that are made with whole wheat. Vegetables All vegetables. Fruits All fruits, but limit coconut. Meats and Other Protein Sources Lean, well-trimmed beef, veal, pork, and lamb. Chicken and [place] without skin. All  fish and shellfish. Wild duck, rabbit, pheasant, and venison. Egg whites or low-cholesterol egg substitutes. Dried beans, peas, lentils, and tofu. Seeds and most nuts. Dairy Low-fat or nonfat cheeses, including ricotta, string, and mozzarella. Skim or 1% milk that is liquid, powdered, or evaporated. Buttermilk that is made with low-fat milk. Nonfat or low-fat yogurt. Beverages Mineral water. Diet carbonated beverages. Sweets and Desserts Sherbets and fruit ices. Honey, jam, marmalade, jelly, and syrups. Meringues and gelatins. Pure sugar candy, such as hard candy, jelly beans, gumdrops, mints, marshmallows, and small amounts of dark chocolate. [employer]. Eat all sweets and desserts in moderation. Fats and Oils Nonhydrogenated (trans-free) margarines. Vegetable oils, including soybean, sesame, sunflower, olive, peanut, safflower, corn, canola, and cottonseed. Salad dressings or mayonnaise made with a vegetable oil. Limit added fats and oils that you use for cooking, baking, salads, and as spreads. Other Cocoa powder. Coffee and tea. All seasonings and condiments. The items listed above may not be a complete list of recommended foods or beverages. Contact your dietitian for more options. What foods are not recommended? Grains Breads that are made with saturated or trans fats, oils,  or whole milk. Croissants. Butter rolls. Cheese breads. Sweet rolls. Donuts. Buttered popcorn. Chow mein noodles. High-fat crackers, such as cheese or butter crackers. Meats and Other Protein Sources Fatty meats, such as hotdogs, short ribs, sausage, spareribs, bacon, rib eye roast or steak, and mutton. High-fat deli meats, such as salami and bologna. Caviar. Domestic duck and goose. Organ meats, such as kidney, liver, sweetbreads, and heart. Dairy Cream, sour cream, cream cheese, and creamed cottage cheese. Whole-milk cheeses, including blue (bleu), [address], [place], [place], [address], [place], [address], cheddar, [place], and [place]. Whole or 2% milk that is liquid, evaporated, or condensed. Whole buttermilk. Cream sauce or high-fat cheese sauce. Yogurt that is made from whole milk. Beverages Regular sodas and juice drinks with added sugar. Sweets and Desserts Frosting. Pudding. Cookies. Cakes other than angel food cake. Candy that has milk chocolate or white chocolate, hydrogenated fat, butter, coconut, or unknown ingredients. Buttered syrups. Full-fat ice cream or ice cream drinks. Fats and Oils Gravy that has suet, meat fat, or shortening. Cocoa butter, hydrogenated oils, palm oil, coconut oil, palm kernel oil. These can often be found in baked products, candy, fried foods, nondairy creamers, and whipped toppings. Solid fats and shortenings, including bacon fat, salt pork, lard, and butter. Nondairy cream substitutes, such as coffee creamers and sour cream substitutes. Salad dressings that are made of unknown oils, cheese, or sour cream. The items listed above may not be a complete list of foods and beverages to avoid. Contact your dietitian for more information. This information is not intended to replace advice given to you by your health care provider. Make sure you discuss any questions you have with your health care provider. Document Released: 02/09/2012 Document Revised: 01/16/2016 Document  Reviewed: 02/01/2014 Elsevier Interactive Patient Education  2018 [employer].  Colchicine tablets or capsules What is this medicine? COLCHICINE (KOL chi seen) is for joint pain and swelling due to attacks of acute gouty arthritis. The medicine is also used to treat familial Mediterranean fever. This medicine may be used for other purposes; ask your health care provider or pharmacist if you have questions. COMMON BRAND NAME(S): Colcrys, MITIGARE What should I tell my health care provider before I take this medicine? They need to know if you have any of these conditions: -anemia -blood disorders like leukemia or lymphoma -heart disease -immune system problems -intestinal  disease -kidney disease -liver disease -muscle pain or weakness -take other medicines -stomach problems -an unusual or allergic reaction to colchicine, other medicines, lactose, foods, dyes, or preservatives -pregnant or trying to get pregnant -breast-feeding How should I use this medicine? Take this medicine by mouth with a full glass of water. Follow the directions on the prescription label. You can take it with or without food. If it upsets your stomach, take it with food. Take your medicine at regular intervals. Do not take your medicine more often than directed. A special MedGuide will be given to you by the pharmacist with each prescription and refill. Be sure to read this information carefully each time. Talk to your pediatrician regarding the use of this medicine in children. While this drug may be prescribed for children as young as 9 years old for selected conditions, precautions do apply. Patients over 6 years old may have a stronger reaction and need a smaller dose. Overdosage: If you think you have taken too much of this medicine contact a poison control center or emergency room at once. NOTE: This medicine is only for you. Do not share this medicine with others. What if I miss a dose? If you miss a  dose, take it as soon as you can. If it is almost time for your next dose, take only that dose. Do not take double or extra doses. What may interact with this medicine? Do not take this medicine with any of the following medications: -certain medicines for fungal infections like itraconazole This medicine may also interact with the following medications: -alcohol -certain medicines for cholesterol like atorvastatin -certain medicines for coughs and colds -certain medicines to help you breathe better -cyclosporine -digoxin -epinephrine -grapefruit or grapefruit juice -methenamine -other medicines for fungal infection -sodium bicarbonate -some antibiotics like clarithromycin, erythromycin, and telithromycin -some medicines for an irregular heartbeat or other heart problems -some medicines for cancer, like lapatinib and tamoxifen -some medicines for HIV This list may not describe all possible interactions. Give your health care provider a list of all the medicines, herbs, non-prescription drugs, or dietary supplements you use. Also tell them if you smoke, drink alcohol, or use illegal drugs. Some items may interact with your medicine. What should I watch for while using this medicine? Visit your doctor or health care professional for regular checks on your progress. You may need periodic blood checks. Alcohol can increase the chance of getting stomach problems and gout attacks. Do not drink alcohol. What side effects may I notice from receiving this medicine? Side effects that you should report to your doctor or health care professional as soon as possible: -allergic reactions like skin rash, itching or hives, swelling of the face, lips, or tongue -fever, chills, or sore throat -muscle tenderness, pain, or weakness -numbness or tingling in hands or feet -unusual bleeding or bruising -unusually weak or tired -vomiting Side effects that usually do not require medical attention (report to  your doctor or health care professional if they continue or are bothersome): -diarrhea -hair loss -loss of appetite -stomach pain or nausea This list may not describe all possible side effects. Call your doctor for medical advice about side effects. You may report side effects to FDA at 1-800-FDA-1088. Where should I keep my medicine? Keep out of the reach of children. Store at room temperature between 15 and 30 degrees C (59 and 86 degrees F). Keep container tightly closed. Protect from light. Throw away any unused medicine after the expiration date. NOTE: This  sheet is a summary. It may not cover all possible information. If you have questions about this medicine, talk to your doctor, pharmacist, or health care provider.  2018 Elsevier/Gold Standard (2013-02-06 16:48:38)

## 2018-05-03 NOTE — Progress Notes (Signed)
Follow Up  Subjective:    Patient ID: Tabitha Scott, female    DOB: 1966-05-12, 52 y.o.   MRN: [id number]   Chief Complaint  Patient presents with  . Follow-up    chronic condition   HPI  Tabitha Scott is a 52 year female with a past medical history of Diabetes, Thyroid Disease, Sleep Apnea, Neuropathy, Anemia, Hypertension, Hyperlipidemia, GERD, Depression, COPD, Chronic Low Back Pain, Carpal Tunnel Syndrome, Chronic Knee Pain, and Arthritis. She is here today for follow up.   Current Status: Since her last office visit, she is doing well, with c/o She is followed by pain management physician at [facility], [address] She has follow up appointment on 05/13/2018. She has situational anxiety r/t family issues. She reports that she has been having increasing episodes of Gout Attacks lately.   She denies fevers, chills, fatigue, recent infections, weight loss, and night sweats. She has not had any headaches, visual changes, dizziness, and falls. No chest pain, heart palpitations, cough and shortness of breath reported. No reports of GI problems such as nausea, vomiting, diarrhea, and constipation. She has no reports of blood in stools, dysuria and hematuria.   Past Medical History:  Diagnosis Date  . Arthritis   . Carpal tunnel syndrome   . Chronic knee pain   . Chronic left shoulder pain   . Chronic lower back pain   . COPD (chronic obstructive pulmonary disease) (HCC)    pt states she does not have COPD  . Depression   . GERD (gastroesophageal reflux disease)   . Gout   . High cholesterol   . Hyperlipidemia   . Hypertension    no longer on medications - stopped taking 4 years ago  . Hypothyroidism   . Low iron   . Neuropathy   . On home oxygen therapy    "have it available; quit smoking; not using it" (04/16/2016)  . OSA (obstructive sleep apnea)    not using cpap  . Pneumonia    03/10/18 - "yeas ago"  . Thyroid disease   . Type II diabetes mellitus (HCC)     Pt was told she did not have diabetes, was treated at one time    Family History  Problem Relation Age of Onset  . COPD Mother   . Heart disease Father   . Cancer Maternal Grandmother     Social History   Socioeconomic History  . Marital status: Divorced    Spouse name: Not on file  . Number of children: Not on file  . Years of education: Not on file  . Highest education level: Not on file  Occupational History  . Not on file  Social Needs  . Financial resource strain: Not on file  . Food insecurity:    Worry: Not on file    Inability: Not on file  . Transportation needs:    Medical: Not on file    Non-medical: Not on file  Tobacco Use  . Smoking status: Former Smoker    Packs/day: 1.50    Years: 41.00    Pack years: 61.50    Types: Cigarettes    Last attempt to quit: 02/11/2015    Years since quitting: 3.2  . Smokeless tobacco: Never Used  Substance and Sexual Activity  . Alcohol use: No    Alcohol/week: 0.0 standard drinks  . Drug use: No  . Sexual activity: Never  Lifestyle  . Physical activity:    Days per week:  Not on file    Minutes per session: Not on file  . Stress: Not on file  Relationships  . Social connections:    Talks on phone: Not on file    Gets together: Not on file    Attends religious service: Not on file    Active member of club or organization: Not on file    Attends meetings of clubs or organizations: Not on file    Relationship status: Not on file  . Intimate partner violence:    Fear of current or ex partner: Not on file    Emotionally abused: Not on file    Physically abused: Not on file    Forced sexual activity: Not on file  Other Topics Concern  . Not on file  Social History Narrative  . Not on file    Past Surgical History:  Procedure Laterality Date  . CARPAL TUNNEL RELEASE Bilateral 2009  . CHOLECYSTECTOMY    . COLONOSCOPY    . DILATION AND CURETTAGE OF UTERUS    . JOINT REPLACEMENT Left 2015   thumb  . KNEE  SURGERY Left 1970s   tissue removed  . LACRIMAL DUCT EXPLORATION Right 12/09/2017   Procedure: DACROCYSTORHINOSTOMY WITH ANTERIOR ETHMOIDECTOMY AND PUNCTOPLASTY, PROBING AND STENT PLACEMENT RIGHT EYE;  Surgeon: Floydene Flock, MD;  Location: [facility] OR;  Service: Ophthalmology;  Laterality: Right;  . LAPAROSCOPIC CHOLECYSTECTOMY  ~ 2014  . NEUROPLASTY / TRANSPOSITION ULNAR NERVE AT ELBOW Bilateral 2009   nerver repair to both arms   . TUBAL LIGATION    . WRIST ARTHROSCOPY WITH CARPOMETACARPEL [facility]) ARTHROPLASTY Right 03/11/2018   Procedure: RIGHT THUMB CARPOMETACARPEL ([facility]) ARTHROPLASTY WITH ABDUCTOR POLLICIS LONGUS TRANSFER, RIGHT WRIST STENOSING TENOSYNOVITIS RELEASE;  Surgeon: Dairl Ponder, MD;  Location: [facility] OR;  Service: Orthopedics;  Laterality: Right;    Immunization History  Administered Date(s) Administered  . Influenza,inj,Quad PF,6+ Mos 10/19/2015, 06/15/2017, 05/03/2018    Current Meds  Medication Sig  . albuterol (PROVENTIL HFA;VENTOLIN HFA) 108 (90 Base) MCG/ACT inhaler Inhale 1-2 puffs into the lungs every 6 (six) hours as needed for wheezing or shortness of breath.  Marland Kitchen atorvastatin (LIPITOR) 40 MG tablet Take 1 tablet (40 mg total) by mouth daily.  . Buprenorphine HCl (BELBUCA) 150 MCG FILM Place 1 mcg inside cheek daily.  . DULoxetine (CYMBALTA) 30 MG capsule Take 1 capsule (30 mg total) by mouth 2 (two) times daily. Take 1 tablet once daily for 7 days, then 2 tablets daily  . furosemide (LASIX) 20 MG tablet Take 1 tablet (20 mg total) by mouth daily.  Marland Kitchen gabapentin (NEURONTIN) 300 MG capsule TAKE 300 MG BY MOUTH 4 TIMES DAILY  . HYDROcodone-acetaminophen (NORCO/VICODIN) 5-325 MG tablet Take 1-2 tablets by mouth every 6 (six) hours as needed.  Marland Kitchen levothyroxine (SYNTHROID, LEVOTHROID) 25 MCG tablet Take 1 tablet (25 mcg total) by mouth daily before breakfast.  . omeprazole (PRILOSEC) 40 MG capsule Take 1 capsule (40 mg total) by mouth 2 (two) times daily.  Marland Kitchen  spironolactone-hydrochlorothiazide (ALDACTAZIDE) 25-25 MG tablet Take 1 tablet by mouth daily.  . [DISCONTINUED] albuterol (PROVENTIL HFA;VENTOLIN HFA) 108 (90 Base) MCG/ACT inhaler Inhale 1-2 puffs into the lungs every 6 (six) hours as needed for wheezing or shortness of breath.  . [DISCONTINUED] atorvastatin (LIPITOR) 40 MG tablet Take 1 tablet (40 mg total) by mouth daily.  . [DISCONTINUED] DULoxetine (CYMBALTA) 30 MG capsule Take 1 capsule (30 mg total) by mouth 2 (two) times daily. Take 1 tablet once daily for  7 days, then 2 tablets daily  . [DISCONTINUED] furosemide (LASIX) 20 MG tablet Take 1 tablet (20 mg total) by mouth daily.  . [DISCONTINUED] gabapentin (NEURONTIN) 300 MG capsule TAKE 300 MG BY MOUTH 4 TIMES DAILY  . [DISCONTINUED] levothyroxine (SYNTHROID, LEVOTHROID) 25 MCG tablet Take 1 tablet (25 mcg total) by mouth daily before breakfast.  . [DISCONTINUED] omeprazole (PRILOSEC) 40 MG capsule Take 1 capsule (40 mg total) by mouth 2 (two) times daily.  . [DISCONTINUED] spironolactone-hydrochlorothiazide (ALDACTAZIDE) 25-25 MG tablet Take 1 tablet by mouth daily.    Allergies  Allergen Reactions  . Other Swelling  . Tetanus Toxoids Swelling  . Tetanus Toxoid, Adsorbed Other (See Comments)    Reaction unknown Reaction unknown    BP (!) 144/82 (BP Location: Right Arm, Patient Position: Sitting, Cuff Size: Large)   Pulse 90   Temp 98.2 F (36.8 C) (Oral)   Ht 5\' 4"  (1.626 m)   Wt (!) 326 lb (147.9 kg)   SpO2 95%   BMI 55.96 kg/m    Review of Systems  Eyes: Negative.   Respiratory: Negative.   Cardiovascular: Negative.   Gastrointestinal: Positive for abdominal distention (Obese).  Genitourinary: Negative.   Musculoskeletal: Positive for joint swelling (gout in great toes).  Skin: Negative.   Neurological: Negative.   Hematological: Negative.   Psychiatric/Behavioral: Negative.       Objective:   Physical Exam  Constitutional: She is oriented to person, place,  and time. She appears well-developed and well-nourished.  HENT:  Head: Normocephalic and atraumatic.  Right Ear: External ear normal.  Left Ear: External ear normal.  Nose: Nose normal.  Mouth/Throat: Oropharynx is clear and moist.  Eyes: Pupils are equal, round, and reactive to light. Conjunctivae and EOM are normal.  Neck: Normal range of motion. Neck supple.  Cardiovascular: Normal rate, regular rhythm, normal heart sounds and intact distal pulses.  Pulmonary/Chest: Effort normal and breath sounds normal.  Abdominal: Soft. Bowel sounds are normal.  Musculoskeletal: Normal range of motion.  Neurological: She is alert and oriented to person, place, and time.  Skin: Capillary refill takes less than 2 seconds.  Psychiatric: She has a normal mood and affect. Her behavior is normal. Judgment and thought content normal.  Nursing note and vitals reviewed.  Assessment & Plan:   1. Gout involving toe, unspecified cause, unspecified chronicity, unspecified laterality We will initiate Colchicine today. Monitor.   2. Diastolic dysfunction She will continue to decrease high sodium intake, excessive alcohol intake, increase potassium intake, smoking cessation, and increase physical activity of at least 30 minutes of cardio activity daily. She will continue to follow Heart Healthy or DASH diet. Refill: - spironolactone-hydrochlorothiazide (ALDACTAZIDE) 25-25 MG tablet; Take 1 tablet by mouth daily.  Dispense: 30 tablet; Refill: 3  3. Hyperlipidemia LDL goal <100 Refill: - atorvastatin (LIPITOR) 40 MG tablet; Take 1 tablet (40 mg total) by mouth daily.  Dispense: 30 tablet; Refill: 3  4. Neuropathy Stable. Refill: - DULoxetine (CYMBALTA) 30 MG capsule; Take 1 capsule (30 mg total) by mouth 2 (two) times daily. Take 1 tablet once daily for 7 days, then 2 tablets daily  Dispense: 60 capsule; Refill: 3 - gabapentin (NEURONTIN) 300 MG capsule; TAKE 300 MG BY MOUTH 4 TIMES DAILY  Dispense: 240  capsule; Refill: 3  5. Lower extremity edema 1 + edema in lower extremities. Not worsening. Refill: - furosemide (LASIX) 20 MG tablet; Take 1 tablet (20 mg total) by mouth daily.  Dispense: 30 tablet; Refill: 3  6. Gastroesophageal reflux disease without esophagitis Refill: - omeprazole (PRILOSEC) 40 MG capsule; Take 1 capsule (40 mg total) by mouth 2 (two) times daily.  Dispense: 60 capsule; Refill: 3  7. Class 3 severe obesity due to excess calories with serious comorbidity and body mass index (BMI) of 50.0 to 59.9 in adult (HCC) BMI is 55.96 today. Her goal BMI  is <25. Encouraged efforts to reduce weight include engaging in physical activity as tolerated with goal of 150 minutes per week. Improve dietary choices and eat a meal regimen consistent with a Mediterranean or DASH diet. Reduce simple carbohydrates. Do not skip meals and eat healthy snacks throughout the day to avoid over-eating at dinner. Set a goal weight loss that is achievable for you.  8. Need for immunization against influenza - Flu Vaccine QUAD 36+ mos IM  9. Prediabetes Hgb A1c is stable at 5.8 today. She will continue to decrease foods/beverages high in sugars and carbs and follow Heart Healthy or DASH diet. Increase physical activity to at least 30 minutes cardio exercise daily.   - POCT glycosylated hemoglobin (Hb A1C) - POCT urinalysis dipstick - Microalbumin/Creatinine Ratio, Urine - Urine Culture  10. Healthcare maintenance - CBC with Differential - Comprehensive metabolic panel - TSH - Lipid Panel - Uric Acid  11. Follow up She will follow up in 3 months.   Meds ordered this encounter  Medications  . colchicine 0.6 MG tablet    Sig: Take 1 tablet (0.6 mg total) by mouth 2 (two) times daily.    Dispense:  30 tablet    Refill:  3  . albuterol (PROVENTIL HFA;VENTOLIN HFA) 108 (90 Base) MCG/ACT inhaler    Sig: Inhale 1-2 puffs into the lungs every 6 (six) hours as needed for wheezing or shortness of  breath.    Dispense:  1 Inhaler    Refill:  11  . atorvastatin (LIPITOR) 40 MG tablet    Sig: Take 1 tablet (40 mg total) by mouth daily.    Dispense:  30 tablet    Refill:  3  . DULoxetine (CYMBALTA) 30 MG capsule    Sig: Take 1 capsule (30 mg total) by mouth 2 (two) times daily. Take 1 tablet once daily for 7 days, then 2 tablets daily    Dispense:  60 capsule    Refill:  3  . furosemide (LASIX) 20 MG tablet    Sig: Take 1 tablet (20 mg total) by mouth daily.    Dispense:  30 tablet    Refill:  3  . gabapentin (NEURONTIN) 300 MG capsule    Sig: TAKE 300 MG BY MOUTH 4 TIMES DAILY    Dispense:  240 capsule    Refill:  3    Please consider 90 day supplies to promote better adherence  . levothyroxine (SYNTHROID, LEVOTHROID) 25 MCG tablet    Sig: Take 1 tablet (25 mcg total) by mouth daily before breakfast.    Dispense:  30 tablet    Refill:  3  . omeprazole (PRILOSEC) 40 MG capsule    Sig: Take 1 capsule (40 mg total) by mouth 2 (two) times daily.    Dispense:  60 capsule    Refill:  3    Please consider 90 day supplies to promote better adherence  . spironolactone-hydrochlorothiazide (ALDACTAZIDE) 25-25 MG tablet    Sig: Take 1 tablet by mouth daily.    Dispense:  30 tablet    Refill:  3    Hold medication for  3 days  While taking lasxi  . tiZANidine (ZANAFLEX) 4 MG capsule    Sig: Take 1 capsule (4 mg total) by mouth 3 (three) times daily as needed for muscle spasms.    Dispense:  30 capsule    Refill:  3    Raliegh Ip,  MSN, FNP-C Patient [facility] [address] [place], [place] [address] [phone]

## 2018-05-04 LAB — CBC WITH DIFFERENTIAL/PLATELET
Basophils Absolute: 0.1 10*3/uL (ref 0.0–0.2)
Basos: 1 %
EOS (ABSOLUTE): 0.2 10*3/uL (ref 0.0–0.4)
Eos: 2 %
Hematocrit: 38.2 % (ref 34.0–46.6)
Hemoglobin: 12.1 g/dL (ref 11.1–15.9)
Immature Grans (Abs): 0.1 10*3/uL (ref 0.0–0.1)
Immature Granulocytes: 1 %
Lymphocytes Absolute: 2.6 10*3/uL (ref 0.7–3.1)
Lymphs: 27 %
MCH: 26.4 pg — ABNORMAL LOW (ref 26.6–33.0)
MCHC: 31.7 g/dL (ref 31.5–35.7)
MCV: 83 fL (ref 79–97)
Monocytes Absolute: 0.6 10*3/uL (ref 0.1–0.9)
Monocytes: 6 %
Neutrophils Absolute: 6.2 10*3/uL (ref 1.4–7.0)
Neutrophils: 63 %
Platelets: 270 10*3/uL (ref 150–450)
RBC: 4.58 x10E6/uL (ref 3.77–5.28)
RDW: 17.5 % — ABNORMAL HIGH (ref 12.3–15.4)
WBC: 9.6 10*3/uL (ref 3.4–10.8)

## 2018-05-04 LAB — COMPREHENSIVE METABOLIC PANEL
ALT: 22 IU/L (ref 0–32)
AST: 25 IU/L (ref 0–40)
Albumin/Globulin Ratio: 1.4 (ref 1.2–2.2)
Albumin: 4.3 g/dL (ref 3.5–5.5)
Alkaline Phosphatase: 116 IU/L (ref 39–117)
BUN/Creatinine Ratio: 10 (ref 9–23)
BUN: 13 mg/dL (ref 6–24)
Bilirubin Total: 0.7 mg/dL (ref 0.0–1.2)
CO2: 31 mmol/L — ABNORMAL HIGH (ref 20–29)
Calcium: 9.4 mg/dL (ref 8.7–10.2)
Chloride: 90 mmol/L — ABNORMAL LOW (ref 96–106)
Creatinine, Ser: 1.35 mg/dL — ABNORMAL HIGH (ref 0.57–1.00)
GFR calc Af Amer: 51 mL/min/{1.73_m2} — ABNORMAL LOW (ref >59)
GFR calc non Af Amer: 45 mL/min/{1.73_m2} — ABNORMAL LOW (ref >59)
Globulin, Total: 3 g/dL (ref 1.5–4.5)
Glucose: 128 mg/dL — ABNORMAL HIGH (ref 65–99)
Potassium: 3.4 mmol/L — ABNORMAL LOW (ref 3.5–5.2)
Sodium: 140 mmol/L (ref 134–144)
Total Protein: 7.3 g/dL (ref 6.0–8.5)

## 2018-05-04 LAB — LIPID PANEL
Chol/HDL Ratio: 5.6 ratio — ABNORMAL HIGH (ref 0.0–4.4)
Cholesterol, Total: 190 mg/dL (ref 100–199)
HDL: 34 mg/dL — ABNORMAL LOW (ref >39)
LDL Calculated: 88 mg/dL (ref 0–99)
Triglycerides: 338 mg/dL — ABNORMAL HIGH (ref 0–149)
VLDL Cholesterol Cal: 68 mg/dL — ABNORMAL HIGH (ref 5–40)

## 2018-05-04 LAB — TSH: TSH: 3.52 u[IU]/mL (ref 0.450–4.500)

## 2018-05-04 LAB — MICROALBUMIN / CREATININE URINE RATIO
Creatinine, Urine: 105.9 mg/dL
Microalb/Creat Ratio: 5.3 mg/g creat (ref 0.0–30.0)
Microalbumin, Urine: 5.6 ug/mL

## 2018-05-04 LAB — URIC ACID: Uric Acid: 10.9 mg/dL — ABNORMAL HIGH (ref 2.5–7.1)

## 2018-05-05 LAB — URINE CULTURE

## 2018-05-07 ENCOUNTER — Other Ambulatory Visit: Payer: Self-pay | Admitting: Family Medicine

## 2018-05-10 ENCOUNTER — Telehealth: Payer: Self-pay | Admitting: Family Medicine

## 2018-05-10 DIAGNOSIS — M109 Gout, unspecified: Secondary | ICD-10-CM

## 2018-05-10 MED ORDER — ALLOPURINOL 100 MG PO TABS
100.0000 mg | ORAL_TABLET | Freq: Every day | ORAL | 6 refills | Status: DC
Start: 2018-05-10 — End: 2018-12-16

## 2018-05-10 NOTE — Telephone Encounter (Signed)
Message left on voice message to call office for lab review.

## 2018-05-10 NOTE — Telephone Encounter (Signed)
Reviewed lab results with patient today. Rx for Lipitor and Allopurinol to pharmacy today. Patient verbalized understanding.

## 2018-05-17 DIAGNOSIS — M1A0720 Idiopathic chronic gout, left ankle and foot, without tophus (tophi): Secondary | ICD-10-CM | POA: Insufficient documentation

## 2018-05-19 ENCOUNTER — Telehealth: Payer: Self-pay

## 2018-05-19 NOTE — Telephone Encounter (Signed)
Patient was advise that she needs appointment and will call next week to schedule visit

## 2018-07-28 ENCOUNTER — Other Ambulatory Visit: Payer: Self-pay

## 2018-07-28 MED ORDER — LEVOTHYROXINE SODIUM 25 MCG PO TABS: 25.0000 ug | ORAL_TABLET | Freq: Every day | ORAL | 0 refills | Status: DC

## 2018-07-28 NOTE — Telephone Encounter (Signed)
Medication sent to pharmacy  

## 2018-08-02 ENCOUNTER — Ambulatory Visit: Payer: Medicaid Other | Admitting: Family Medicine

## 2018-08-05 ENCOUNTER — Other Ambulatory Visit: Payer: Self-pay | Admitting: Nephrology

## 2018-08-05 DIAGNOSIS — N183 Chronic kidney disease, stage 3 unspecified: Secondary | ICD-10-CM

## 2018-08-15 ENCOUNTER — Other Ambulatory Visit: Payer: Medicaid Other

## 2018-08-25 ENCOUNTER — Ambulatory Visit
Admission: RE | Admit: 2018-08-25 | Discharge: 2018-08-25 | Disposition: A | Discharge status: Home / Self Care | Payer: Medicaid Other | Source: Ambulatory Visit | Attending: Nephrology | Admitting: Nephrology

## 2018-08-25 DIAGNOSIS — N183 Chronic kidney disease, stage 3 unspecified: Secondary | ICD-10-CM

## 2018-08-30 ENCOUNTER — Ambulatory Visit (INDEPENDENT_AMBULATORY_CARE_PROVIDER_SITE_OTHER): Payer: Medicaid Other | Admitting: Family Medicine

## 2018-08-30 ENCOUNTER — Encounter: Payer: Self-pay | Admitting: Family Medicine

## 2018-08-30 VITALS — BP 140/72 | HR 77 | Temp 98.0°F | Ht 64.0 in | Wt 335.0 lb

## 2018-08-30 DIAGNOSIS — J449 Chronic obstructive pulmonary disease, unspecified: Secondary | ICD-10-CM | POA: Diagnosis not present

## 2018-08-30 DIAGNOSIS — I10 Essential (primary) hypertension: Secondary | ICD-10-CM | POA: Diagnosis not present

## 2018-08-30 DIAGNOSIS — R0602 Shortness of breath: Secondary | ICD-10-CM

## 2018-08-30 DIAGNOSIS — R0902 Hypoxemia: Secondary | ICD-10-CM

## 2018-08-30 DIAGNOSIS — R0989 Other specified symptoms and signs involving the circulatory and respiratory systems: Secondary | ICD-10-CM

## 2018-08-30 DIAGNOSIS — R05 Cough: Secondary | ICD-10-CM

## 2018-08-30 DIAGNOSIS — M109 Gout, unspecified: Secondary | ICD-10-CM

## 2018-08-30 DIAGNOSIS — R319 Hematuria, unspecified: Secondary | ICD-10-CM

## 2018-08-30 DIAGNOSIS — R350 Frequency of micturition: Secondary | ICD-10-CM

## 2018-08-30 DIAGNOSIS — R8290 Unspecified abnormal findings in urine: Secondary | ICD-10-CM

## 2018-08-30 DIAGNOSIS — E66813 Obesity, class 3: Secondary | ICD-10-CM

## 2018-08-30 DIAGNOSIS — Z0000 Encounter for general adult medical examination without abnormal findings: Secondary | ICD-10-CM

## 2018-08-30 DIAGNOSIS — Z09 Encounter for follow-up examination after completed treatment for conditions other than malignant neoplasm: Secondary | ICD-10-CM

## 2018-08-30 DIAGNOSIS — E785 Hyperlipidemia, unspecified: Secondary | ICD-10-CM

## 2018-08-30 DIAGNOSIS — R600 Localized edema: Secondary | ICD-10-CM

## 2018-08-30 DIAGNOSIS — N390 Urinary tract infection, site not specified: Secondary | ICD-10-CM

## 2018-08-30 DIAGNOSIS — R059 Cough, unspecified: Secondary | ICD-10-CM

## 2018-08-30 DIAGNOSIS — K219 Gastro-esophageal reflux disease without esophagitis: Secondary | ICD-10-CM | POA: Diagnosis not present

## 2018-08-30 DIAGNOSIS — G629 Polyneuropathy, unspecified: Secondary | ICD-10-CM

## 2018-08-30 DIAGNOSIS — Z6841 Body Mass Index (BMI) 40.0 and over, adult: Secondary | ICD-10-CM

## 2018-08-30 LAB — POCT URINALYSIS DIP (MANUAL ENTRY)
Bilirubin, UA: NEGATIVE
Glucose, UA: NEGATIVE mg/dL
Ketones, POC UA: NEGATIVE mg/dL
Nitrite, UA: NEGATIVE
Protein Ur, POC: NEGATIVE mg/dL
Spec Grav, UA: 1.01
Urobilinogen, UA: 0.2 U/dL
pH, UA: 7

## 2018-08-30 MED ORDER — SULFAMETHOXAZOLE-TRIMETHOPRIM 800-160 MG PO TABS: 1.0000 | ORAL_TABLET | Freq: Two times a day (BID) | ORAL | 0 refills | Status: DC

## 2018-08-30 MED ORDER — METOPROLOL SUCCINATE ER 25 MG PO TB24: 25.0000 mg | ORAL_TABLET | Freq: Every day | ORAL | 3 refills | Status: DC

## 2018-08-30 MED ORDER — NEBULIZER COMPRESSOR KIT: 1.0000 [IU] | PACK | Freq: Every day | 0 refills | Status: AC | PRN

## 2018-08-30 MED ORDER — ALBUTEROL SULFATE (2.5 MG/3ML) 0.083% IN NEBU: 2.5000 mg | INHALATION_SOLUTION | Freq: Four times a day (QID) | RESPIRATORY_TRACT | 12 refills | Status: DC | PRN

## 2018-08-30 NOTE — Patient Instructions (Signed)
Urinary Tract Infection, Adult A urinary tract infection (UTI) is an infection of any part of the urinary tract. The urinary tract includes:  The kidneys.  The ureters.  The bladder.  The urethra. These organs make, store, and get rid of pee (urine) in the body. What are the causes? This is caused by germs (bacteria) in your genital area. These germs grow and cause swelling (inflammation) of your urinary tract. What increases the risk? You are more likely to develop this condition if:  You have a small, thin tube (catheter) to drain pee.  You cannot control when you pee or poop (incontinence).  You are female, and: ? You use these methods to prevent pregnancy: ? A medicine that kills sperm (spermicide). ? A device that blocks sperm (diaphragm). ? You have low levels of a female hormone (estrogen). ? You are pregnant.  You have genes that add to your risk.  You are sexually active.  You take antibiotic medicines.  You have trouble peeing because of: ? A prostate that is bigger than normal, if you are female. ? A blockage in the part of your body that drains pee from the bladder (urethra). ? A kidney stone. ? A nerve condition that affects your bladder (neurogenic bladder). ? Not getting enough to drink. ? Not peeing often enough.  You have other conditions, such as: ? Diabetes. ? A weak disease-fighting system (immune system). ? Sickle cell disease. ? Gout. ? Injury of the spine. What are the signs or symptoms? Symptoms of this condition include:  Needing to pee right away (urgently).  Peeing often.  Peeing small amounts often.  Pain or burning when peeing.  Blood in the pee.  Pee that smells bad or not like normal.  Trouble peeing.  Pee that is cloudy.  Fluid coming from the vagina, if you are female.  Pain in the belly or lower back. Other symptoms include:  Throwing up (vomiting).  No urge to eat.  Feeling mixed up (confused).  Being tired  and grouchy (irritable).  A fever.  Watery poop (diarrhea). How is this treated? This condition may be treated with:  Antibiotic medicine.  Other medicines.  Drinking enough water. Follow these instructions at home:  Medicines  Take over-the-counter and prescription medicines only as told by your doctor.  If you were prescribed an antibiotic medicine, take it as told by your doctor. Do not stop taking it even if you start to feel better. General instructions  Make sure you: ? Pee until your bladder is empty. ? Do not hold pee for a long time. ? Empty your bladder after sex. ? Wipe from front to back after pooping if you are a female. Use each tissue one time when you wipe.  Drink enough fluid to keep your pee pale yellow.  Keep all follow-up visits as told by your doctor. This is important. Contact a doctor if:  You do not get better after 1-2 days.  Your symptoms go away and then come back. Get help right away if:  You have very bad back pain.  You have very bad pain in your lower belly.  You have a fever.  You are sick to your stomach (nauseous).  You are throwing up. Summary  A urinary tract infection (UTI) is an infection of any part of the urinary tract.  This condition is caused by germs in your genital area.  There are many risk factors for a UTI. These include having a small, thin   tube to drain pee and not being able to control when you pee or poop.  Treatment includes antibiotic medicines for germs.  Drink enough fluid to keep your pee pale yellow. This information is not intended to replace advice given to you by your health care provider. Make sure you discuss any questions you have with your health care provider. Document Released: 01/27/2008 Document Revised: 02/17/2018 Document Reviewed: 02/17/2018 Elsevier Interactive Patient Education  2019 [employer]. Sulfamethoxazole; Trimethoprim, SMX-TMP tablets What is this medicine?  SULFAMETHOXAZOLE; TRIMETHOPRIM or SMX-TMP (suhl fuh meth OK suh zohl; trye METH oh prim) is a combination of a sulfonamide antibiotic and a second antibiotic, trimethoprim. It is used to treat or prevent certain kinds of bacterial infections. It will not work for colds, flu, or other viral infections. This medicine may be used for other purposes; ask your health care provider or pharmacist if you have questions. COMMON BRAND NAME(S): Bacter-Aid DS, Bactrim, Bactrim DS, Septra, Septra DS What should I tell my health care provider before I take this medicine? They need to know if you have any of these conditions: -anemia -asthma -being treated with anticonvulsants -if you frequently drink alcohol containing drinks -kidney disease -liver disease -low level of folic acid or glucose-6-phosphate dehydrogenase -poor nutrition or malabsorption -porphyria -severe allergies -thyroid disorder -an unusual or allergic reaction to sulfamethoxazole, trimethoprim, sulfa drugs, other medicines, foods, dyes, or preservatives -pregnant or trying to get pregnant -breast-feeding How should I use this medicine? Take this medicine by mouth with a full glass of water. Follow the directions on the prescription label. Take your medicine at regular intervals. Do not take it more often than directed. Do not skip doses or stop your medicine early. Talk to your pediatrician regarding the use of this medicine in children. Special care may be needed. This medicine has been used in children as young as 2 months of age. Overdosage: If you think you have taken too much of this medicine contact a poison control center or emergency room at once. NOTE: This medicine is only for you. Do not share this medicine with others. What if I miss a dose? If you miss a dose, take it as soon as you can. If it is almost time for your next dose, take only that dose. Do not take double or extra doses. What may interact with this medicine?  Do not take this medicine with any of the following medications: -aminobenzoate potassium -dofetilide -metronidazole This medicine may also interact with the following medications: -ACE inhibitors like benazepril, enalapril, lisinopril, and ramipril -birth control pills -cyclosporine -digoxin -diuretics -indomethacin -medicines for diabetes -methenamine -methotrexate -phenytoin -potassium supplements -pyrimethamine -sulfinpyrazone -tricyclic antidepressants -warfarin This list may not describe all possible interactions. Give your health care provider a list of all the medicines, herbs, non-prescription drugs, or dietary supplements you use. Also tell them if you smoke, drink alcohol, or use illegal drugs. Some items may interact with your medicine. What should I watch for while using this medicine? Tell your doctor or health care professional if your symptoms do not improve. Drink several glasses of water a day to reduce the risk of kidney problems. Do not treat diarrhea with over the counter products. Contact your doctor if you have diarrhea that lasts more than 2 days or if it is severe and watery. This medicine can make you more sensitive to the sun. Keep out of the sun. If you cannot avoid being in the sun, wear protective clothing and use a sunscreen. Do   not use sun lamps or tanning beds/booths. What side effects may I notice from receiving this medicine? Side effects that you should report to your doctor or health care professional as soon as possible: -allergic reactions like skin rash or hives, swelling of the face, lips, or tongue -breathing problems -fever or chills, sore throat -irregular heartbeat, chest pain -joint or muscle pain -pain or difficulty passing urine -red pinpoint spots on skin -redness, blistering, peeling or loosening of the skin, including inside the mouth -unusual bleeding or bruising -unusually weak or tired -yellowing of the eyes or  skin Side effects that usually do not require medical attention (report to your doctor or health care professional if they continue or are bothersome): -diarrhea -dizziness -headache -loss of appetite -nausea, vomiting -nervousness This list may not describe all possible side effects. Call your doctor for medical advice about side effects. You may report side effects to FDA at 1-800-FDA-1088. Where should I keep my medicine? Keep out of the reach of children. Store at room temperature between 20 to 25 degrees C (68 to 77 degrees F). Protect from light. Throw away any unused medicine after the expiration date. NOTE: This sheet is a summary. It may not cover all possible information. If you have questions about this medicine, talk to your doctor, pharmacist, or health care provider.  2019 Elsevier/Gold Standard (2013-03-17 14:38:26) Heart-Healthy Eating Plan Heart-healthy meal planning includes:  Eating less unhealthy fats.  Eating more healthy fats.  Making other changes in your diet. Talk with your doctor or a diet specialist (dietitian) to create an eating plan that is right for you. What is my plan? Your doctor may recommend an eating plan that includes:  Total fat: ______% or less of total calories a day.  Saturated fat: ______% or less of total calories a day.  Cholesterol: less than _________mg a day. What are tips for following this plan? Cooking Avoid frying your food. Try to bake, boil, grill, or broil it instead. You can also reduce fat by:  Removing the skin from poultry.  Removing all visible fats from meats.  Steaming vegetables in water or broth. Meal planning   At meals, divide your plate into four equal parts: ? Fill one-half of your plate with vegetables and green salads. ? Fill one-fourth of your plate with whole grains. ? Fill one-fourth of your plate with lean protein foods.  Eat 4-5 servings of vegetables per day. A serving of vegetables is: ? 1  cup of raw or cooked vegetables. ? 2 cups of raw leafy greens.  Eat 4-5 servings of fruit per day. A serving of fruit is: ? 1 medium whole fruit. ?  cup of dried fruit. ?  cup of fresh, frozen, or canned fruit. ?  cup of 100% fruit juice.  Eat more foods that have soluble fiber. These are apples, broccoli, carrots, beans, peas, and barley. Try to get 20-30 g of fiber per day.  Eat 4-5 servings of nuts, legumes, and seeds per week: ? 1 serving of dried beans or legumes equals  cup after being cooked. ? 1 serving of nuts is  cup. ? 1 serving of seeds equals 1 tablespoon. General information  Eat more home-cooked food. Eat less restaurant, buffet, and fast food.  Limit or avoid alcohol.  Limit foods that are high in starch and sugar.  Avoid fried foods.  Lose weight if you are overweight.  Keep track of how much salt (sodium) you eat. This is important if you  have high blood pressure. Ask your doctor to tell you more about this.  Try to add vegetarian meals each week. Fats  Choose healthy fats. These include olive oil and canola oil, flaxseeds, walnuts, almonds, and seeds.  Eat more omega-3 fats. These include salmon, mackerel, sardines, tuna, flaxseed oil, and ground flaxseeds. Try to eat fish at least 2 times each week.  Check food labels. Avoid foods with trans fats or high amounts of saturated fat.  Limit saturated fats. ? These are often found in animal products, such as meats, butter, and cream. ? These are also found in plant foods, such as palm oil, palm kernel oil, and coconut oil.  Avoid foods with partially hydrogenated oils in them. These have trans fats. Examples are stick margarine, some tub margarines, cookies, crackers, and other baked goods. What foods can I eat? Fruits All fresh, canned (in natural juice), or frozen fruits. Vegetables Fresh or frozen vegetables (raw, steamed, roasted, or grilled). Green salads. Grains Most grains. Choose whole  wheat and whole grains most of the time. Rice and pasta, including brown rice and pastas made with whole wheat. Meats and other proteins Lean, well-trimmed beef, veal, pork, and lamb. Chicken and [place] without skin. All fish and shellfish. Wild duck, rabbit, pheasant, and venison. Egg whites or low-cholesterol egg substitutes. Dried beans, peas, lentils, and tofu. Seeds and most nuts. Dairy Low-fat or nonfat cheeses, including ricotta and mozzarella. Skim or 1% milk that is liquid, powdered, or evaporated. Buttermilk that is made with low-fat milk. Nonfat or low-fat yogurt. Fats and oils Non-hydrogenated (trans-free) margarines. Vegetable oils, including soybean, sesame, sunflower, olive, peanut, safflower, corn, canola, and cottonseed. Salad dressings or mayonnaise made with a vegetable oil. Beverages Mineral water. Coffee and tea. Diet carbonated beverages. Sweets and desserts Sherbet, gelatin, and fruit ice. Small amounts of dark chocolate. Limit all sweets and desserts. Seasonings and condiments All seasonings and condiments. The items listed above may not be a complete list of foods and drinks you can eat. Contact a dietitian for more options. What foods should I avoid? Fruits Canned fruit in heavy syrup. Fruit in cream or butter sauce. Fried fruit. Limit coconut. Vegetables Vegetables cooked in cheese, cream, or butter sauce. Fried vegetables. Grains Breads that are made with saturated or trans fats, oils, or whole milk. Croissants. Sweet rolls. Donuts. High-fat crackers, such as cheese crackers. Meats and other proteins Fatty meats, such as hot dogs, ribs, sausage, bacon, rib-eye roast or steak. High-fat deli meats, such as salami and bologna. Caviar. Domestic duck and goose. Organ meats, such as liver. Dairy Cream, sour cream, cream cheese, and creamed cottage cheese. Whole-milk cheeses. Whole or 2% milk that is liquid, evaporated, or condensed. Whole buttermilk. Cream sauce or  high-fat cheese sauce. Yogurt that is made from whole milk. Fats and oils Meat fat, or shortening. Cocoa butter, hydrogenated oils, palm oil, coconut oil, palm kernel oil. Solid fats and shortenings, including bacon fat, salt pork, lard, and butter. Nondairy cream substitutes. Salad dressings with cheese or sour cream. Beverages Regular sodas and juice drinks with added sugar. Sweets and desserts Frosting. Pudding. Cookies. Cakes. Pies. Milk chocolate or white chocolate. Buttered syrups. Full-fat ice cream or ice cream drinks. The items listed above may not be a complete list of foods and drinks to avoid. Contact a dietitian for more information. Summary  Heart-healthy meal planning includes eating less unhealthy fats, eating more healthy fats, and making other changes in your diet.  Eat a balanced diet. This  includes fruits and vegetables, low-fat or nonfat dairy, lean protein, nuts and legumes, whole grains, and heart-healthy oils and fats. This information is not intended to replace advice given to you by your health care provider. Make sure you discuss any questions you have with your health care provider. Document Released: 02/09/2012 Document Revised: 09/17/2017 Document Reviewed: 09/17/2017 Elsevier Interactive Patient Education  2019 [employer] Inc. DASH Eating Plan DASH stands for "Dietary Approaches to Stop Hypertension." The DASH eating plan is a healthy eating plan that has been shown to reduce high blood pressure (hypertension). It may also reduce your risk for type 2 diabetes, heart disease, and stroke. The DASH eating plan may also help with weight loss. What are tips for following this plan?  General guidelines  Avoid eating more than 2,300 mg (milligrams) of salt (sodium) a day. If you have hypertension, you may need to reduce your sodium intake to 1,500 mg a day.  Limit alcohol intake to no more than 1 drink a day for nonpregnant women and 2 drinks a day for men. One drink  equals 12 oz of beer, 5 oz of wine, or 1 oz of hard liquor.  Work with your health care provider to maintain a healthy body weight or to lose weight. Ask what an ideal weight is for you.  Get at least 30 minutes of exercise that causes your heart to beat faster (aerobic exercise) most days of the week. Activities may include walking, swimming, or biking.  Work with your health care provider or diet and nutrition specialist (dietitian) to adjust your eating plan to your individual calorie needs. Reading food labels   Check food labels for the amount of sodium per serving. Choose foods with less than 5 percent of the Daily Value of sodium. Generally, foods with less than 300 mg of sodium per serving fit into this eating plan.  To find whole grains, look for the word "whole" as the first word in the ingredient list. Shopping  Buy products labeled as "low-sodium" or "no salt added."  Buy fresh foods. Avoid canned foods and premade or frozen meals. Cooking  Avoid adding salt when cooking. Use salt-free seasonings or herbs instead of table salt or sea salt. Check with your health care provider or pharmacist before using salt substitutes.  Do not fry foods. Cook foods using healthy methods such as baking, boiling, grilling, and broiling instead.  Cook with heart-healthy oils, such as olive, canola, soybean, or sunflower oil. Meal planning  Eat a balanced diet that includes: ? 5 or more servings of fruits and vegetables each day. At each meal, try to fill half of your plate with fruits and vegetables. ? Up to 6-8 servings of whole grains each day. ? Less than 6 oz of lean meat, poultry, or fish each day. A 3-oz serving of meat is about the same size as a deck of cards. One egg equals 1 oz. ? 2 servings of low-fat dairy each day. ? A serving of nuts, seeds, or beans 5 times each week. ? Heart-healthy fats. Healthy fats called Omega-3 fatty acids are found in foods such as flaxseeds and  coldwater fish, like sardines, salmon, and mackerel.  Limit how much you eat of the following: ? Canned or prepackaged foods. ? Food that is high in trans fat, such as fried foods. ? Food that is high in saturated fat, such as fatty meat. ? Sweets, desserts, sugary drinks, and other foods with added sugar. ? Full-fat dairy products.  Do not salt foods before eating.  Try to eat at least 2 vegetarian meals each week.  Eat more home-cooked food and less restaurant, buffet, and fast food.  When eating at a restaurant, ask that your food be prepared with less salt or no salt, if possible. What foods are recommended? The items listed may not be a complete list. Talk with your dietitian about what dietary choices are best for you. Grains Whole-grain or whole-wheat bread. Whole-grain or whole-wheat pasta. Brown rice. Orpah Cobb. Bulgur. Whole-grain and low-sodium cereals. Pita bread. Low-fat, low-sodium crackers. Whole-wheat flour tortillas. Vegetables Fresh or frozen vegetables (raw, steamed, roasted, or grilled). Low-sodium or reduced-sodium tomato and vegetable juice. Low-sodium or reduced-sodium tomato sauce and tomato paste. Low-sodium or reduced-sodium canned vegetables. Fruits All fresh, dried, or frozen fruit. Canned fruit in natural juice (without added sugar). Meat and other protein foods Skinless chicken or [place]. Ground chicken or [place]. Pork with fat trimmed off. Fish and seafood. Egg whites. Dried beans, peas, or lentils. Unsalted nuts, nut butters, and seeds. Unsalted canned beans. Lean cuts of beef with fat trimmed off. Low-sodium, lean deli meat. Dairy Low-fat (1%) or fat-free (skim) milk. Fat-free, low-fat, or reduced-fat cheeses. Nonfat, low-sodium ricotta or cottage cheese. Low-fat or nonfat yogurt. Low-fat, low-sodium cheese. Fats and oils Soft margarine without trans fats. Vegetable oil. Low-fat, reduced-fat, or light mayonnaise and salad dressings (reduced-sodium).  Canola, safflower, olive, soybean, and sunflower oils. Avocado. Seasoning and other foods Herbs. Spices. Seasoning mixes without salt. Unsalted popcorn and pretzels. Fat-free sweets. What foods are not recommended? The items listed may not be a complete list. Talk with your dietitian about what dietary choices are best for you. Grains Baked goods made with fat, such as croissants, muffins, or some breads. Dry pasta or rice meal packs. Vegetables Creamed or fried vegetables. Vegetables in a cheese sauce. Regular canned vegetables (not low-sodium or reduced-sodium). Regular canned tomato sauce and paste (not low-sodium or reduced-sodium). Regular tomato and vegetable juice (not low-sodium or reduced-sodium). Rosita Fire. Olives. Fruits Canned fruit in a light or heavy syrup. Fried fruit. Fruit in cream or butter sauce. Meat and other protein foods Fatty cuts of meat. Ribs. Fried meat. Tomasa Blase. Sausage. Bologna and other processed lunch meats. Salami. Fatback. Hotdogs. Bratwurst. Salted nuts and seeds. Canned beans with added salt. Canned or smoked fish. Whole eggs or egg yolks. Chicken or [place] with skin. Dairy Whole or 2% milk, cream, and half-and-half. Whole or full-fat cream cheese. Whole-fat or sweetened yogurt. Full-fat cheese. Nondairy creamers. Whipped toppings. Processed cheese and cheese spreads. Fats and oils Butter. Stick margarine. Lard. Shortening. Ghee. Bacon fat. Tropical oils, such as coconut, palm kernel, or palm oil. Seasoning and other foods Salted popcorn and pretzels. Onion salt, garlic salt, seasoned salt, table salt, and sea salt. Worcestershire sauce. Tartar sauce. Barbecue sauce. Teriyaki sauce. Soy sauce, including reduced-sodium. Steak sauce. Canned and packaged gravies. Fish sauce. Oyster sauce. Cocktail sauce. Horseradish that you find on the shelf. Ketchup. Mustard. Meat flavorings and tenderizers. Bouillon cubes. Hot sauce and Tabasco sauce. Premade or packaged marinades.  Premade or packaged taco seasonings. Relishes. Regular salad dressings. Where to find more information:  National Heart, Lung, and Blood Institute: PopSteam.is  American Heart Association: www.heart.org Summary  The DASH eating plan is a healthy eating plan that has been shown to reduce high blood pressure (hypertension). It may also reduce your risk for type 2 diabetes, heart disease, and stroke.  With the DASH eating plan, you should limit salt (sodium)  intake to 2,300 mg a day. If you have hypertension, you may need to reduce your sodium intake to 1,500 mg a day.  When on the DASH eating plan, aim to eat more fresh fruits and vegetables, whole grains, lean proteins, low-fat dairy, and heart-healthy fats.  Work with your health care provider or diet and nutrition specialist (dietitian) to adjust your eating plan to your individual calorie needs. This information is not intended to replace advice given to you by your health care provider. Make sure you discuss any questions you have with your health care provider. Document Released: 07/30/2011 Document Revised: 08/03/2016 Document Reviewed: 08/03/2016 Elsevier Interactive Patient Education  2019 [employer] Inc. Chronic Obstructive Pulmonary Disease Chronic obstructive pulmonary disease (COPD) is a long-term (chronic) lung problem. When you have COPD, it is hard for air to get in and out of your lungs. Usually the condition gets worse over time, and your lungs will never return to normal. There are things you can do to keep yourself as healthy as possible.  Your doctor may treat your condition with: ? Medicines. ? Oxygen. ? Lung surgery.  Your doctor may also recommend: ? Rehabilitation. This includes steps to make your body work better. It may involve a team of specialists. ? Quitting smoking, if you smoke. ? Exercise and changes to your diet. ? Comfort measures (palliative care). Follow these instructions at  home: Medicines  Take over-the-counter and prescription medicines only as told by your doctor.  Talk to your doctor before taking any cough or allergy medicines. You may need to avoid medicines that cause your lungs to be dry. Lifestyle  If you smoke, stop. Smoking makes the problem worse. If you need help quitting, ask your doctor.  Avoid being around things that make your breathing worse. This may include smoke, chemicals, and fumes.  Stay active, but remember to rest as well.  Learn and use tips on how to relax.  Make sure you get enough sleep. Most adults need at least 7 hours of sleep every night.  Eat healthy foods. Eat smaller meals more often. Rest before meals. Controlled breathing Learn and use tips on how to control your breathing as told by your doctor. Try:  Breathing in (inhaling) through your nose for 1 second. Then, pucker your lips and breath out (exhale) through your lips for 2 seconds.  Putting one hand on your belly (abdomen). Breathe in slowly through your nose for 1 second. Your hand on your belly should move out. Pucker your lips and breathe out slowly through your lips. Your hand on your belly should move in as you breathe out.  Controlled coughing Learn and use controlled coughing to clear mucus from your lungs. Follow these steps: 1. Lean your head a little forward. 2. Breathe in deeply. 3. Try to hold your breath for 3 seconds. 4. Keep your mouth slightly open while coughing 2 times. 5. Spit any mucus out into a tissue. 6. Rest and do the steps again 1 or 2 times as needed. General instructions  Make sure you get all the shots (vaccines) that your doctor recommends. Ask your doctor about a flu shot and a pneumonia shot.  Use oxygen therapy and pulmonary rehabilitation if told by your doctor. If you need home oxygen therapy, ask your doctor if you should buy a tool to measure your oxygen level (oximeter).  Make a COPD action plan with your doctor.  This helps you to know what to do if you feel worse than  usual.  Manage any other conditions you have as told by your doctor.  Avoid going outside when it is very hot, cold, or humid.  Avoid people who have a sickness you can catch (contagious).  Keep all follow-up visits as told by your doctor. This is important. Contact a doctor if:  You cough up more mucus than usual.  There is a change in the color or thickness of the mucus.  It is harder to breathe than usual.  Your breathing is faster than usual.  You have trouble sleeping.  You need to use your medicines more often than usual.  You have trouble doing your normal activities such as getting dressed or walking around the house. Get help right away if:  You have shortness of breath while resting.  You have shortness of breath that stops you from: ? Being able to talk. ? Doing normal activities.  Your chest hurts for longer than 5 minutes.  Your skin color is more blue than usual.  Your pulse oximeter shows that you have low oxygen for longer than 5 minutes.  You have a fever.  You feel too tired to breathe normally. Summary  Chronic obstructive pulmonary disease (COPD) is a long-term lung problem.  The way your lungs work will never return to normal. Usually the condition gets worse over time. There are things you can do to keep yourself as healthy as possible.  Take over-the-counter and prescription medicines only as told by your doctor.  If you smoke, stop. Smoking makes the problem worse. This information is not intended to replace advice given to you by your health care provider. Make sure you discuss any questions you have with your health care provider. Document Released: 01/27/2008 Document Revised: 09/14/2016 Document Reviewed: 09/14/2016 Elsevier Interactive Patient Education  2019 Elsevier Inc. COPD and Physical Activity Chronic obstructive pulmonary disease (COPD) is a long-term (chronic) condition  that affects the lungs. COPD is a general term that can be used to describe many different lung problems that cause lung swelling (inflammation) and limit airflow, including chronic bronchitis and emphysema. The main symptom of COPD is shortness of breath, which makes it harder to do even simple tasks. This can also make it harder to exercise and be active. Talk with your health care provider about treatments to help you breathe better and actions you can take to prevent breathing problems during physical activity. What are the benefits of exercising with COPD? Exercising regularly is an important part of a healthy lifestyle. You can still exercise and do physical activities even though you have COPD. Exercise and physical activity improve your shortness of breath by increasing blood flow (circulation). This causes your heart to pump more oxygen through your body. Moderate exercise can improve your:  Oxygen use.  Energy level.  Shortness of breath.  Strength in your breathing muscles.  Heart health.  Sleep.  Self-esteem and feelings of self-worth.  Depression, stress, and anxiety levels. Exercise can benefit everyone with COPD. The severity of your disease may affect how hard you can exercise, especially at first, but everyone can benefit. Talk with your health care provider about how much exercise is safe for you, and which activities and exercises are safe for you. What actions can I take to prevent breathing problems during physical activity?  Sign up for a pulmonary rehabilitation program. This type of program may include: ? Education about lung diseases. ? Exercise classes that teach you how to exercise and be more active while improving  your breathing. This usually involves:  Exercise using your lower extremities, such as a stationary bicycle.  About 30 minutes of exercise, 2 to 5 times per week, for 6 to 12 weeks  Strength training, such as push ups or leg lifts. ? Nutrition  education. ? Group classes in which you can talk with others who also have COPD and learn ways to manage stress.  If you use an oxygen tank, you should use it while you exercise. Work with your health care provider to adjust your oxygen for your physical activity. Your resting flow rate is different from your flow rate during physical activity.  While you are exercising: ? Take slow breaths. ? Pace yourself and do not try to go too fast. ? Purse your lips while breathing out. Pursing your lips is similar to a kissing or whistling position. ? If doing exercise that uses a quick burst of effort, such as weight lifting:  Breathe in before starting the exercise.  Breathe out during the hardest part of the exercise (such as raising the weights). Where to find support You can find support for exercising with COPD from:  Your health care provider.  A pulmonary rehabilitation program.  Your local health department or community health programs.  Support groups, online or in-person. Your health care provider may be able to recommend support groups. Where to find more information You can find more information about exercising with COPD from:  American Lung Association: [place].hu.  COPD Foundation: AlmostHot.gl. Contact a health care provider if:  Your symptoms get worse.  You have chest pain.  You have nausea.  You have a fever.  You have trouble talking or catching your breath.  You want to start a new exercise program or a new activity. Summary  COPD is a general term that can be used to describe many different lung problems that cause lung swelling (inflammation) and limit airflow. This includes chronic bronchitis and emphysema.  Exercise and physical activity improve your shortness of breath by increasing blood flow (circulation). This causes your heart to provide more oxygen to your body.  Contact your health care provider before starting any exercise program or new  activity. Ask your health care provider what exercises and activities are safe for you. This information is not intended to replace advice given to you by your health care provider. Make sure you discuss any questions you have with your health care provider. Document Released: 09/02/2017 Document Revised: 09/02/2017 Document Reviewed: 09/02/2017 Elsevier Interactive Patient Education  2019 [employer].

## 2018-08-30 NOTE — Progress Notes (Signed)
Follow Up  Subjective:    Patient ID: Tabitha Scott, female    DOB: 1965-09-30, 53 y.o.   MRN: [id number]   Chief Complaint  Patient presents with  . Follow-up    HPI  Ms Naron is a 53 year old female with a past medical history of Diabetes, Thyroid Disease, Pneumonia, OSA, Neuropathy, Anemia, Hypertension, Hyperlipidemia, Gout, GERD, COPD, Chronic Low Back Pain, Chronic Left Shoulder Pain, Chronic Knee Pain, Carpal Tunnel Syndrome, and Arthritis. She is here today for follow up.   Current Status: Since her last office visit, she is doing well with no complaints today. She was recently diagnosed with Stage 3 CKD, by Nephrologist, who discontinued fluid pill and added Spironolactone/HCTZ for blood pressure control. She also has urinary frequency and recently began Oxybutinin.   She reports shortness of breath on exertion and occasional at rest. She denies chest pain, cough, heart palpitations, and falls. He has occasionally headaches and dizziness with position changes. Denies severe headaches, confusion, seizures, double vision, and blurred vision, nausea and vomiting. She denies fatigue, frequent urination, blurred vision, excessive hunger, excessive thirst, weight gain, weight loss, and poor wound healing.   She reports increase fatigue lastly. She denies fevers, chills, fatigue, recent infections, weight loss, and night sweats. No reports of GI problems such as diarrhea, and constipation. She has no reports of blood in stools, dysuria and hematuria. No depression or anxiety reported. She denies pain today.   Review of Systems  Constitutional: Negative.   HENT: Negative.   Eyes: Negative.   Respiratory: Positive for cough and shortness of breath.        Occasional Dyspnea   Cardiovascular: Negative.   Gastrointestinal: Positive for abdominal distention.  Endocrine: Negative.   Genitourinary: Negative.   Musculoskeletal: Positive for arthralgias (Generalized Chronic Pain).   Skin: Negative.   Allergic/Immunologic: Negative.   Neurological: Positive for dizziness, numbness (tingling) and headaches.  Hematological: Negative.   Psychiatric/Behavioral: Negative.    Objective:   Physical Exam Vitals signs and nursing note reviewed.  Constitutional:      Appearance: Normal appearance. She is obese.  HENT:     Head: Normocephalic and atraumatic.     Right Ear: External ear normal.     Left Ear: External ear normal.     Nose: Nose normal.     Mouth/Throat:     Mouth: Mucous membranes are moist.     Pharynx: Oropharynx is clear.  Eyes:     Conjunctiva/sclera: Conjunctivae normal.  Neck:     Musculoskeletal: Normal range of motion and neck supple.  Cardiovascular:     Rate and Rhythm: Normal rate and regular rhythm.     Pulses: Normal pulses.     Heart sounds: Normal heart sounds.  Pulmonary:     Effort: Pulmonary effort is normal.     Breath sounds: Normal breath sounds.  Abdominal:     General: Bowel sounds are normal. There is distension (Obese).     Palpations: Abdomen is soft.  Musculoskeletal: Normal range of motion.  Skin:    General: Skin is warm and dry.     Capillary Refill: Capillary refill takes less than 2 seconds.  Neurological:     General: No focal deficit present.     Mental Status: She is alert and oriented to person, place, and time.  Psychiatric:        Mood and Affect: Mood normal.        Behavior: Behavior normal.  Thought Content: Thought content normal.        Judgment: Judgment normal.    Assessment & Plan:   1. Essential hypertension Blood pressure at 140/72 today. Continue Metoprolol, Spironolactone/HCTZ as prescribed. She will continue to decrease high sodium intake, excessive alcohol intake, increase potassium intake, smoking cessation, and increase physical activity of at least 30 minutes of cardio activity daily. She will continue to follow Heart Healthy or DASH diet. - POCT urinalysis dipstick  2. Chronic  obstructive pulmonary disease, unspecified COPD type ([place]) We will initiate home nebulizer and nebulizer  machine today. She will continue Albuterol as needed.  - Respiratory Therapy Supplies (NEBULIZER COMPRESSOR) KIT; 1 Units by Does not apply route daily as needed.  Dispense: 1 each; Refill: 0 - albuterol (PROVENTIL) (2.5 MG/3ML) 0.083% nebulizer solution; Take 3 mLs (2.5 mg total) by nebulization every 6 (six) hours as needed for wheezing or shortness of breath.  Dispense: 150 mL; Refill: 12 - Ambulatory referral to Home Health  3. Gastroesophageal reflux disease without esophagitis Stable. Continue Omeprazole as prescribed.   4. Class 3 severe obesity due to excess calories with serious comorbidity and body mass index (BMI) of 50.0 to 59.9 in adult [facility]) Body mass index is 57.5 kg/m. Goal BMI  is <30. Encouraged efforts to reduce weight include engaging in physical activity as tolerated with goal of 150 minutes per week. Improve dietary choices and eat a meal regimen consistent with a Mediterranean or DASH diet. Reduce simple carbohydrates. Do not skip meals and eat healthy snacks throughout the day to avoid over-eating at dinner. Set a goal weight loss that is achievable for you.  5. Hyperlipidemia LDL goal <100 LDL at 88 on 05/03/2018. Triglycerides are elevated. She was recently diagnosed with Stage 3 CKD; Lopid contraindicated. He will continue Atorvastatin as prescribed.   6. Gout involving toe, unspecified cause, unspecified chronicity, unspecified laterality She reports no recent incidents of Gout. She will continue Allopurinol daily and Colchicine as needed.   7. Neuropathy Stable. Not worsening. Continue Gabapentin as prescribed.   8. Chest congestion - Respiratory Therapy Supplies (NEBULIZER COMPRESSOR) KIT; 1 Units by Does not apply route daily as needed.  Dispense: 1 each; Refill: 0 - albuterol (PROVENTIL) (2.5 MG/3ML) 0.083% nebulizer solution; Take 3 mLs (2.5 mg total) by  nebulization every 6 (six) hours as needed for wheezing or shortness of breath.  Dispense: 150 mL; Refill: 12  9. Lower extremity edema 1+ peripheral edema. Continue Spironolactone/HCTZ as prescribed. Monitor.   10. Cough Cough is stable today.  - Respiratory Therapy Supplies (NEBULIZER COMPRESSOR) KIT; 1 Units by Does not apply route daily as needed.  Dispense: 1 each; Refill: 0 - albuterol (PROVENTIL) (2.5 MG/3ML) 0.083% nebulizer solution; Take 3 mLs (2.5 mg total) by nebulization every 6 (six) hours as needed for wheezing or shortness of breath.  Dispense: 150 mL; Refill: 12 - Ambulatory referral to [place]. Shortness of breath - Ambulatory referral to [place]. Urine frequency Continue Oxybutynin as prescribed.   13. Healthcare maintenance - CBC with Differential - Comprehensive metabolic panel - TSH - Lipid Panel - Vitamin D, 25-hydroxy - Vitamin B12  14. Abnormal urinalysis Results are pending.  - Urine Culture - sulfamethoxazole-trimethoprim (BACTRIM DS,SEPTRA DS) 800-160 MG tablet; Take 1 tablet by mouth 2 (two) times daily.  Dispense: 14 tablet; Refill: 0  15. Urinary tract infection with hematuria, site unspecified We will initiate Septra today.  - sulfamethoxazole-trimethoprim (BACTRIM DS,SEPTRA DS) 800-160 MG tablet;  Take 1 tablet by mouth 2 (two) times daily.  Dispense: 14 tablet; Refill: 0  16. Oxygen deficit Oxygen saturations are between 88-91% today. We will refer patient to [facility] today.  - Ambulatory referral to [place]. Follow up She will follow up in 3 months.   Meds ordered this encounter  Medications  . Respiratory Therapy Supplies (NEBULIZER COMPRESSOR) KIT    Sig: 1 Units by Does not apply route daily as needed.    Dispense:  1 each    Refill:  0  . albuterol (PROVENTIL) (2.5 MG/3ML) 0.083% nebulizer solution    Sig: Take 3 mLs (2.5 mg total) by nebulization every 6 (six) hours as needed for wheezing or  shortness of breath.    Dispense:  150 mL    Refill:  12  . metoprolol succinate (TOPROL-XL) 25 MG 24 hr tablet    Sig: Take 1 tablet (25 mg total) by mouth daily.    Dispense:  30 tablet    Refill:  3  . sulfamethoxazole-trimethoprim (BACTRIM DS,SEPTRA DS) 800-160 MG tablet    Sig: Take 1 tablet by mouth 2 (two) times daily.    Dispense:  14 tablet    Refill:  0    Referral Orders     Ambulatory referral to [facility],  MSN, [facility] [address] [place], [place] [address] [phone]

## 2018-08-31 ENCOUNTER — Telehealth: Payer: Self-pay

## 2018-08-31 LAB — CBC WITH DIFFERENTIAL/PLATELET
Basophils Absolute: 0.1 10*3/uL (ref 0.0–0.2)
Basos: 1 %
EOS (ABSOLUTE): 0.2 10*3/uL (ref 0.0–0.4)
Eos: 2 %
Hematocrit: 34.5 % (ref 34.0–46.6)
Hemoglobin: 10.9 g/dL — ABNORMAL LOW (ref 11.1–15.9)
Immature Grans (Abs): 0.1 10*3/uL (ref 0.0–0.1)
Immature Granulocytes: 1 %
Lymphocytes Absolute: 2.2 10*3/uL (ref 0.7–3.1)
Lymphs: 28 %
MCH: 25.2 pg — ABNORMAL LOW (ref 26.6–33.0)
MCHC: 31.6 g/dL (ref 31.5–35.7)
MCV: 80 fL (ref 79–97)
Monocytes Absolute: 0.6 10*3/uL (ref 0.1–0.9)
Monocytes: 7 %
Neutrophils Absolute: 4.8 10*3/uL (ref 1.4–7.0)
Neutrophils: 61 %
Platelets: 258 10*3/uL (ref 150–450)
RBC: 4.32 x10E6/uL (ref 3.77–5.28)
RDW: 16.7 % — ABNORMAL HIGH (ref 11.7–15.4)
WBC: 7.8 10*3/uL (ref 3.4–10.8)

## 2018-08-31 LAB — COMPREHENSIVE METABOLIC PANEL
ALT: 17 IU/L (ref 0–32)
AST: 20 IU/L (ref 0–40)
Albumin/Globulin Ratio: 1.3 (ref 1.2–2.2)
Albumin: 4 g/dL (ref 3.5–5.5)
Alkaline Phosphatase: 111 IU/L (ref 39–117)
BUN/Creatinine Ratio: 12 (ref 9–23)
BUN: 13 mg/dL (ref 6–24)
Bilirubin Total: 0.7 mg/dL (ref 0.0–1.2)
CO2: 29 mmol/L (ref 20–29)
Calcium: 8.9 mg/dL (ref 8.7–10.2)
Chloride: 96 mmol/L (ref 96–106)
Creatinine, Ser: 1.06 mg/dL — ABNORMAL HIGH (ref 0.57–1.00)
GFR calc Af Amer: 69 mL/min/{1.73_m2} (ref >59)
GFR calc non Af Amer: 60 mL/min/{1.73_m2} (ref >59)
Globulin, Total: 3.1 g/dL (ref 1.5–4.5)
Glucose: 84 mg/dL (ref 65–99)
Potassium: 4.3 mmol/L (ref 3.5–5.2)
Sodium: 142 mmol/L (ref 134–144)
Total Protein: 7.1 g/dL (ref 6.0–8.5)

## 2018-08-31 LAB — TSH: TSH: 4.54 u[IU]/mL — ABNORMAL HIGH (ref 0.450–4.500)

## 2018-08-31 LAB — VITAMIN D 25 HYDROXY (VIT D DEFICIENCY, FRACTURES): Vit D, 25-Hydroxy: 8.3 ng/mL — ABNORMAL LOW (ref 30.0–100.0)

## 2018-08-31 LAB — LIPID PANEL
Chol/HDL Ratio: 3.8 ratio (ref 0.0–4.4)
Cholesterol, Total: 150 mg/dL (ref 100–199)
HDL: 39 mg/dL — ABNORMAL LOW (ref >39)
LDL Calculated: 73 mg/dL (ref 0–99)
Triglycerides: 190 mg/dL — ABNORMAL HIGH (ref 0–149)
VLDL Cholesterol Cal: 38 mg/dL (ref 5–40)

## 2018-08-31 LAB — VITAMIN B12: Vitamin B-12: 429 pg/mL (ref 232–1245)

## 2018-09-01 NOTE — Telephone Encounter (Signed)
Patient needs a order for nebulizer machine. Patient is ware that you are out of office.

## 2018-09-02 LAB — URINE CULTURE

## 2018-09-02 NOTE — Telephone Encounter (Signed)
Patient notified

## 2018-09-07 ENCOUNTER — Other Ambulatory Visit: Payer: Self-pay | Admitting: Family Medicine

## 2018-09-07 DIAGNOSIS — E559 Vitamin D deficiency, unspecified: Secondary | ICD-10-CM

## 2018-09-07 MED ORDER — VITAMIN D (ERGOCALCIFEROL) 1.25 MG (50000 UNIT) PO CAPS: 50000.0000 [IU] | ORAL_CAPSULE | ORAL | 3 refills | Status: DC

## 2018-09-08 ENCOUNTER — Telehealth: Payer: Self-pay

## 2018-09-08 NOTE — Telephone Encounter (Signed)
[facility] is unable to take patient for these services and referral has been sent to home instead to see if they can take her.

## 2018-09-08 NOTE — Telephone Encounter (Signed)
-----   Message from Natalie M Stroud, FNP sent at 08/30/2018  9:58 PM EST ----- Regarding: "[facility]" Please follow up with referral to [facility] for home assessment for home Oxygen. Let me know outcome. Thank you.   

## 2018-09-08 NOTE — Telephone Encounter (Signed)
Patient states that her kidney doctor put her on Vitamin D yesterday so she will pick up script and start it.

## 2018-09-08 NOTE — Telephone Encounter (Signed)
-----   Message from Kallie Locks, FNP sent at 09/07/2018 10:32 PM EST ----- Regarding: "Lab Results" Tabitha Scott,   Please inform patient that she has a low Vitamin D level. We recommend that she takes a weekly Vitamin D supplement of 50,000 IUs to increase vitamin d levels. All other labs are stable. She is to keep follow up appointment.     Thank you.

## 2018-09-09 NOTE — Telephone Encounter (Signed)
-----   Message from Natalie M Stroud, FNP sent at 08/30/2018  9:58 PM EST ----- Regarding: "[facility]" Please follow up with referral to [facility] for home assessment for home Oxygen. Let me know outcome. Thank you.   

## 2018-09-09 NOTE — Telephone Encounter (Signed)
Patient given information for Home instead and told to give them a call if she doesn't hear from them by Wednesday.

## 2018-09-14 ENCOUNTER — Other Ambulatory Visit: Payer: Self-pay

## 2018-09-15 ENCOUNTER — Telehealth: Payer: Self-pay

## 2018-09-15 NOTE — Telephone Encounter (Signed)
Pharmacy states that the brand of Colcry's is covered but not generic. DO you want to change to brand or would you like me to do a PA for generic.

## 2018-09-15 NOTE — Telephone Encounter (Signed)
PA was approved and sent to pharmacy

## 2018-09-19 NOTE — Telephone Encounter (Signed)
[facility] called and state that they don't take Medicaid and they will send referral over to [facility].

## 2018-09-19 NOTE — Telephone Encounter (Signed)
-----   Message from Kallie Locks, FNP sent at 08/30/2018  9:58 PM EST ----- Regarding: "[facility]" Please follow up with referral to [facility] for home assessment for home Oxygen. Let me know outcome. Thank you.

## 2018-11-23 ENCOUNTER — Other Ambulatory Visit: Payer: Self-pay | Admitting: Family Medicine

## 2018-11-23 DIAGNOSIS — K219 Gastro-esophageal reflux disease without esophagitis: Secondary | ICD-10-CM

## 2018-11-28 ENCOUNTER — Ambulatory Visit: Payer: Medicaid Other | Admitting: Family Medicine

## 2018-11-29 ENCOUNTER — Ambulatory Visit: Payer: Medicaid Other | Admitting: Family Medicine

## 2018-12-16 ENCOUNTER — Other Ambulatory Visit: Payer: Self-pay

## 2018-12-16 ENCOUNTER — Ambulatory Visit (INDEPENDENT_AMBULATORY_CARE_PROVIDER_SITE_OTHER): Payer: Medicaid Other | Admitting: Family Medicine

## 2018-12-16 ENCOUNTER — Telehealth: Payer: Self-pay

## 2018-12-16 DIAGNOSIS — K219 Gastro-esophageal reflux disease without esophagitis: Secondary | ICD-10-CM

## 2018-12-16 DIAGNOSIS — M109 Gout, unspecified: Secondary | ICD-10-CM

## 2018-12-16 DIAGNOSIS — E559 Vitamin D deficiency, unspecified: Secondary | ICD-10-CM

## 2018-12-16 DIAGNOSIS — J449 Chronic obstructive pulmonary disease, unspecified: Secondary | ICD-10-CM

## 2018-12-16 DIAGNOSIS — R05 Cough: Secondary | ICD-10-CM

## 2018-12-16 DIAGNOSIS — R21 Rash and other nonspecific skin eruption: Secondary | ICD-10-CM

## 2018-12-16 DIAGNOSIS — R0989 Other specified symptoms and signs involving the circulatory and respiratory systems: Secondary | ICD-10-CM | POA: Diagnosis not present

## 2018-12-16 DIAGNOSIS — R059 Cough, unspecified: Secondary | ICD-10-CM

## 2018-12-16 DIAGNOSIS — G629 Polyneuropathy, unspecified: Secondary | ICD-10-CM

## 2018-12-16 DIAGNOSIS — I5189 Other ill-defined heart diseases: Secondary | ICD-10-CM

## 2018-12-16 DIAGNOSIS — R0602 Shortness of breath: Secondary | ICD-10-CM

## 2018-12-16 DIAGNOSIS — E785 Hyperlipidemia, unspecified: Secondary | ICD-10-CM

## 2018-12-16 MED ORDER — OXYBUTYNIN CHLORIDE ER 10 MG PO TB24: 10.0000 mg | ORAL_TABLET | Freq: Every day | ORAL | 3 refills | Status: DC

## 2018-12-16 MED ORDER — OMEPRAZOLE 40 MG PO CPDR: 40.0000 mg | DELAYED_RELEASE_CAPSULE | Freq: Two times a day (BID) | ORAL | 3 refills | Status: DC

## 2018-12-16 MED ORDER — VITAMIN D (ERGOCALCIFEROL) 1.25 MG (50000 UNIT) PO CAPS: 50000.0000 [IU] | ORAL_CAPSULE | ORAL | 3 refills | Status: DC

## 2018-12-16 MED ORDER — METOPROLOL SUCCINATE ER 25 MG PO TB24: 25.0000 mg | ORAL_TABLET | Freq: Every day | ORAL | 3 refills | Status: DC

## 2018-12-16 MED ORDER — GABAPENTIN 300 MG PO CAPS: ORAL_CAPSULE | ORAL | 3 refills | Status: DC

## 2018-12-16 MED ORDER — ALLOPURINOL 100 MG PO TABS: 100.0000 mg | ORAL_TABLET | Freq: Every day | ORAL | 3 refills | Status: DC

## 2018-12-16 MED ORDER — LEVOTHYROXINE SODIUM 25 MCG PO TABS: 25.0000 ug | ORAL_TABLET | Freq: Every day | ORAL | 3 refills | Status: DC

## 2018-12-16 MED ORDER — DULOXETINE HCL 30 MG PO CPEP: 30.0000 mg | ORAL_CAPSULE | Freq: Two times a day (BID) | ORAL | 3 refills | Status: DC

## 2018-12-16 MED ORDER — SPIRONOLACTONE-HCTZ 25-25 MG PO TABS: 1.0000 | ORAL_TABLET | Freq: Every day | ORAL | 3 refills | Status: DC

## 2018-12-16 MED ORDER — HYDROCORTISONE 1 % EX CREA: 1.0000 "application " | TOPICAL_CREAM | Freq: Two times a day (BID) | CUTANEOUS | 3 refills | Status: DC

## 2018-12-16 MED ORDER — ATORVASTATIN CALCIUM 40 MG PO TABS: 40.0000 mg | ORAL_TABLET | Freq: Every day | ORAL | 3 refills | Status: DC

## 2018-12-16 NOTE — Telephone Encounter (Signed)
Left a vm for Tabitha Scott at [facility] to see if they were able to process the referral for Home Health. Also a order will be sent to [facility] for oxygen.  [facility]-[phone]

## 2018-12-16 NOTE — Progress Notes (Signed)
Virtual Visit via Telephone Note  I connected with Grandville Silos on 12/17/18 at  3:00 PM EDT by telephone and verified that I am speaking with the correct person using two identifiers.   I discussed the limitations, risks, security and privacy concerns of performing an evaluation and management service by telephone and the availability of in person appointments. I also discussed with the patient that there may be a patient responsible charge related to this service. The patient expressed understanding and agreed to proceed.   History of Present Illness:  Past Medical History:  Diagnosis Date  . Arthritis   . Carpal tunnel syndrome   . Chronic knee pain   . Chronic left shoulder pain   . Chronic lower back pain   . COPD (chronic obstructive pulmonary disease) ([place])    pt states she does not have COPD  . Depression   . GERD (gastroesophageal reflux disease)   . Gout   . High cholesterol   . Hyperlipidemia   . Hypertension    no longer on medications - stopped taking 4 years ago  . Hypothyroidism   . Low iron   . Neuropathy   . On home oxygen therapy    "have it available; quit smoking; not using it" (04/16/2016)  . OSA (obstructive sleep apnea)    not using cpap  . Pneumonia    03/10/18 - "yeas ago"  . Thyroid disease   . Type II diabetes mellitus ([place])    Pt was told she did not have diabetes, was treated at one time    Current Outpatient Medications on File Prior to Visit  Medication Sig Dispense Refill  . albuterol (PROVENTIL HFA;VENTOLIN HFA) 108 (90 Base) MCG/ACT inhaler Inhale 1-2 puffs into the lungs every 6 (six) hours as needed for wheezing or shortness of breath. 1 Inhaler 11  . albuterol (PROVENTIL) (2.5 MG/3ML) 0.083% nebulizer solution Take 3 mLs (2.5 mg total) by nebulization every 6 (six) hours as needed for wheezing or shortness of breath. 150 mL 12  . Buprenorphine HCl (BELBUCA) 150 MCG FILM Place 1 mcg inside cheek daily.    . colchicine 0.6 MG tablet  Take 1 tablet (0.6 mg total) by mouth 2 (two) times daily. 30 tablet 3  . HYDROcodone-acetaminophen (NORCO/VICODIN) 5-325 MG tablet Take 1-2 tablets by mouth every 6 (six) hours as needed. 15 tablet 0  . Respiratory Therapy Supplies (NEBULIZER COMPRESSOR) KIT 1 Units by Does not apply route daily as needed. 1 each 0   No current facility-administered medications on file prior to visit.     Current Status: Since her last office visit, she has c/o rash on right side of leg. She has an appointment with Nephrologist on 12/19/2018. She continues to follow up with Pain Management. She has not heard back from [facility] agency for home oxygen.  She denies fevers, chills, fatigue, recent infections, weight loss, and night sweats. She has not had any headaches, visual changes, dizziness, and falls. No chest pain, heart palpitations, cough and shortness of breath reported. No reports of GI problems such as nausea, vomiting, diarrhea, and constipation. She has no reports of blood in stools, dysuria and hematuria. No depression or anxiety reported. She denies pain today.    Observations/Objective:  Telephone Virtual Visit.    Assessment and Plan:  1. Rash - hydrocortisone cream 1 %; Apply 1 application topically 2 (two) times daily.  Dispense: 30 g; Refill: 3  2. Cough  3. Chest congestion  4.  Chronic obstructive pulmonary disease, unspecified COPD type ([place]) Stable today. No signs or symptoms of respiratory distress reported today. - Ambulatory referral to Home Health  5. Gout involving toe, unspecified cause, unspecified chronicity, unspecified laterality - allopurinol (ZYLOPRIM) 100 MG tablet; Take 1 tablet (100 mg total) by mouth daily.  Dispense: 30 tablet; Refill: 3  6. Hyperlipidemia LDL goal <100 - atorvastatin (LIPITOR) 40 MG tablet; Take 1 tablet (40 mg total) by mouth daily.  Dispense: 30 tablet; Refill: 3  7. Neuropathy - gabapentin (NEURONTIN) 300 MG capsule; TAKE 300 MG BY  MOUTH 4 TIMES DAILY  Dispense: 240 capsule; Refill: 3 - DULoxetine (CYMBALTA) 30 MG capsule; Take 1 capsule (30 mg total) by mouth 2 (two) times daily. Take 1 tablet once daily for 7 days, then 2 tablets daily  Dispense: 60 capsule; Refill: 3  8. Gastroesophageal reflux disease without esophagitis - omeprazole (PRILOSEC) 40 MG capsule; Take 1 capsule (40 mg total) by mouth 2 (two) times daily.  Dispense: 60 capsule; Refill: 3  9. Diastolic dysfunction - spironolactone-hydrochlorothiazide (ALDACTAZIDE) 25-25 MG tablet; Take 1 tablet by mouth daily.  Dispense: 30 tablet; Refill: 3  10. Vitamin D deficiency - Vitamin D, Ergocalciferol, (DRISDOL) 1.25 MG (50000 UT) CAPS capsule; Take 1 capsule (50,000 Units total) by mouth every 7 (seven) days.  Dispense: 5 capsule; Refill: 3  11. Shortness of breath Requesting home oxygen. - Ambulatory referral to [facility] ordered this encounter  Medications  . hydrocortisone cream 1 %    Sig: Apply 1 application topically 2 (two) times daily.    Dispense:  30 g    Refill:  3  . allopurinol (ZYLOPRIM) 100 MG tablet    Sig: Take 1 tablet (100 mg total) by mouth daily.    Dispense:  30 tablet    Refill:  3  . atorvastatin (LIPITOR) 40 MG tablet    Sig: Take 1 tablet (40 mg total) by mouth daily.    Dispense:  30 tablet    Refill:  3  . gabapentin (NEURONTIN) 300 MG capsule    Sig: TAKE 300 MG BY MOUTH 4 TIMES DAILY    Dispense:  240 capsule    Refill:  3    Please consider 90 day supplies to promote better adherence  . levothyroxine (SYNTHROID) 25 MCG tablet    Sig: Take 1 tablet (25 mcg total) by mouth daily before breakfast.    Dispense:  30 tablet    Refill:  3  . metoprolol succinate (TOPROL-XL) 25 MG 24 hr tablet    Sig: Take 1 tablet (25 mg total) by mouth daily.    Dispense:  30 tablet    Refill:  3  . omeprazole (PRILOSEC) 40 MG capsule    Sig: Take 1 capsule (40 mg total) by mouth 2 (two) times daily.    Dispense:  60 capsule     Refill:  3    Please consider 90 day supplies to promote better adherence  . oxybutynin (DITROPAN-XL) 10 MG 24 hr tablet    Sig: Take 1 tablet (10 mg total) by mouth at bedtime.    Dispense:  30 tablet    Refill:  3  . spironolactone-hydrochlorothiazide (ALDACTAZIDE) 25-25 MG tablet    Sig: Take 1 tablet by mouth daily.    Dispense:  30 tablet    Refill:  3    Hold medication for 3 days  While taking lasxi  . Vitamin D, Ergocalciferol, (DRISDOL) 1.25 MG (50000  UT) CAPS capsule    Sig: Take 1 capsule (50,000 Units total) by mouth every 7 (seven) days.    Dispense:  5 capsule    Refill:  3  . DULoxetine (CYMBALTA) 30 MG capsule    Sig: Take 1 capsule (30 mg total) by mouth 2 (two) times daily. Take 1 tablet once daily for 7 days, then 2 tablets daily    Dispense:  60 capsule    Refill:  3    Orders Placed This Encounter  Procedures  . Ambulatory referral to [facility]     Referral Orders     Ambulatory referral to [facility],  MSN, [address] [place], [place] [address] [phone]   Follow Up Instructions:    I discussed the assessment and treatment plan with the patient. The patient was provided an opportunity to ask questions and all were answered. The patient agreed with the plan and demonstrated an understanding of the instructions.   The patient was advised to call back or seek an in-person evaluation if the symptoms worsen or if the condition fails to improve as anticipated.  I provided 20 minutes of non-face-to-face time during this encounter.   Azzie Glatter, FNP

## 2018-12-23 NOTE — Telephone Encounter (Signed)
Message sent to provider 

## 2019-01-19 ENCOUNTER — Encounter: Payer: Self-pay | Admitting: Family Medicine

## 2019-02-22 DIAGNOSIS — R7303 Prediabetes: Secondary | ICD-10-CM

## 2019-02-22 HISTORY — DX: Prediabetes: R73.03

## 2019-03-05 ENCOUNTER — Other Ambulatory Visit: Payer: Self-pay | Admitting: Family Medicine

## 2019-03-05 DIAGNOSIS — R600 Localized edema: Secondary | ICD-10-CM

## 2019-03-06 ENCOUNTER — Other Ambulatory Visit: Payer: Self-pay | Admitting: Family Medicine

## 2019-03-06 DIAGNOSIS — R600 Localized edema: Secondary | ICD-10-CM

## 2019-03-17 ENCOUNTER — Ambulatory Visit (INDEPENDENT_AMBULATORY_CARE_PROVIDER_SITE_OTHER): Payer: Medicaid Other | Admitting: Family Medicine

## 2019-03-17 ENCOUNTER — Encounter: Payer: Self-pay | Admitting: Family Medicine

## 2019-03-17 ENCOUNTER — Other Ambulatory Visit: Payer: Self-pay

## 2019-03-17 VITALS — BP 140/82 | HR 94 | Temp 98.3°F | Ht 64.0 in | Wt 342.8 lb

## 2019-03-17 DIAGNOSIS — R0602 Shortness of breath: Secondary | ICD-10-CM

## 2019-03-17 DIAGNOSIS — Z09 Encounter for follow-up examination after completed treatment for conditions other than malignant neoplasm: Secondary | ICD-10-CM

## 2019-03-17 DIAGNOSIS — R7303 Prediabetes: Secondary | ICD-10-CM

## 2019-03-17 DIAGNOSIS — E119 Type 2 diabetes mellitus without complications: Secondary | ICD-10-CM

## 2019-03-17 DIAGNOSIS — J449 Chronic obstructive pulmonary disease, unspecified: Secondary | ICD-10-CM | POA: Diagnosis not present

## 2019-03-17 DIAGNOSIS — R7981 Abnormal blood-gas level: Secondary | ICD-10-CM

## 2019-03-17 DIAGNOSIS — I10 Essential (primary) hypertension: Secondary | ICD-10-CM | POA: Diagnosis not present

## 2019-03-17 DIAGNOSIS — Z6841 Body Mass Index (BMI) 40.0 and over, adult: Secondary | ICD-10-CM

## 2019-03-17 DIAGNOSIS — E66813 Obesity, class 3: Secondary | ICD-10-CM

## 2019-03-17 DIAGNOSIS — R8290 Unspecified abnormal findings in urine: Secondary | ICD-10-CM

## 2019-03-17 DIAGNOSIS — R600 Localized edema: Secondary | ICD-10-CM

## 2019-03-17 LAB — POCT URINALYSIS DIP (MANUAL ENTRY)
Bilirubin, UA: NEGATIVE
Blood, UA: NEGATIVE
Glucose, UA: NEGATIVE mg/dL
Ketones, POC UA: NEGATIVE mg/dL
Nitrite, UA: NEGATIVE
Protein Ur, POC: NEGATIVE mg/dL
Spec Grav, UA: 1.02 (ref 1.010–1.025)
Urobilinogen, UA: 0.2 E.U./dL
pH, UA: 6.5 (ref 5.0–8.0)

## 2019-03-17 LAB — POCT GLYCOSYLATED HEMOGLOBIN (HGB A1C): Hemoglobin A1C: 6.2 % — AB (ref 4.0–5.6)

## 2019-03-17 LAB — GLUCOSE, POCT (MANUAL RESULT ENTRY): POC Glucose: 157 mg/dl — AB (ref 70–99)

## 2019-03-17 NOTE — Progress Notes (Signed)
[facility]   Established Patient Office Visit  Subjective:  Patient ID: Tabitha Scott, female    DOB: 01/07/1966  Age: 53 y.o. MRN: [id number]  CC:  Chief Complaint  Patient presents with  . Follow-up    chronic condition     HPI Tabitha Scott is a 53 year old female who presents for follow up today.   Past Medical History:  Diagnosis Date  . Arthritis   . Carpal tunnel syndrome   . Chronic knee pain   . Chronic left shoulder pain   . Chronic lower back pain   . COPD (chronic obstructive pulmonary disease) ([place])    pt states she does not have COPD  . Depression   . GERD (gastroesophageal reflux disease)   . Gout   . High cholesterol   . Hyperlipidemia   . Hypertension    no longer on medications - stopped taking 4 years ago  . Hypothyroidism   . Low iron   . Neuropathy   . On home oxygen therapy    "have it available; quit smoking; not using it" (04/16/2016)  . OSA (obstructive sleep apnea)    not using cpap  . Pneumonia    03/10/18 - "yeas ago"  . Thyroid disease   . Type II diabetes mellitus (HCC)    Pt was told she did not have diabetes, was treated at one time    Past Surgical History:  Procedure Laterality Date  . CARPAL TUNNEL RELEASE Bilateral 2009  . CHOLECYSTECTOMY    . COLONOSCOPY    . DILATION AND CURETTAGE OF UTERUS    . JOINT REPLACEMENT Left 2015   thumb  . KNEE SURGERY Left 1970s   tissue removed  . LACRIMAL DUCT EXPLORATION Right 12/09/2017   Procedure: DACROCYSTORHINOSTOMY WITH ANTERIOR ETHMOIDECTOMY AND PUNCTOPLASTY, PROBING AND STENT PLACEMENT RIGHT EYE;  Surgeon: Clista Bernhardt, MD;  Location: [facility];  Service: Ophthalmology;  Laterality: Right;  . LAPAROSCOPIC CHOLECYSTECTOMY  ~ 2014  . NEUROPLASTY / TRANSPOSITION ULNAR NERVE AT ELBOW Bilateral 2009   nerver repair to both arms   . TUBAL LIGATION    . WRIST ARTHROSCOPY WITH CARPOMETACARPEL [facility]) ARTHROPLASTY Right  03/11/2018   Procedure: RIGHT THUMB CARPOMETACARPEL ([place]) ARTHROPLASTY WITH ABDUCTOR POLLICIS LONGUS TRANSFER, RIGHT WRIST STENOSING TENOSYNOVITIS RELEASE;  Surgeon: Charlotte Crumb, MD;  Location: [facility];  Service: Orthopedics;  Laterality: Right;    Current Status: Since her last office visit, she is doing well with no complaints. She continues to use oxygen at 2 liters as needed. She is requesting a walker for better ambulation.  She has not been checking her prep and post prandial blood glucose levels lately. She denies fatigue, frequent urination, blurred vision, excessive hunger, excessive thirst, weight gain, weight loss, and poor wound healing. She continues to check she feet regularly. She denies visual changes, chest pain, cough, shortness of breath, heart palpitations, and falls. She has occasional headaches and dizziness with position changes. Denies severe headaches, confusion, seizures, double vision, and blurred vision, nausea and vomiting.  She denies fevers, chills, recent infections, weight loss, and night sweats. No chest pain, heart palpitations, cough and shortness of breath reported. No reports of GI problems such as diarrhea, and constipation. She has no reports of blood in stools, dysuria and hematuria. No depression or anxiety reported.  Family History  Problem Relation Age of Onset  . COPD Mother   . Heart disease Father   .  Cancer Maternal Grandmother     Social History   Socioeconomic History  . Marital status: Divorced    Spouse name: Not on file  . Number of children: Not on file  . Years of education: Not on file  . Highest education level: Not on file  Occupational History  . Not on file  Social Needs  . Financial resource strain: Not on file  . Food insecurity    Worry: Not on file    Inability: Not on file  . Transportation needs    Medical: Not on file    Non-medical: Not on file  Tobacco Use  . Smoking status: Former Smoker    Packs/day: 1.50     Years: 41.00    Pack years: 61.50    Types: Cigarettes    Quit date: 02/11/2015    Years since quitting: 4.0  . Smokeless tobacco: Never Used  Substance and Sexual Activity  . Alcohol use: No    Alcohol/week: 0.0 standard drinks  . Drug use: No  . Sexual activity: Never  Lifestyle  . Physical activity    Days per week: Not on file    Minutes per session: Not on file  . Stress: Not on file  Relationships  . Social Herbalist on phone: Not on file    Gets together: Not on file    Attends religious service: Not on file    Active member of club or organization: Not on file    Attends meetings of clubs or organizations: Not on file    Relationship status: Not on file  . Intimate partner violence    Fear of current or ex partner: Not on file    Emotionally abused: Not on file    Physically abused: Not on file    Forced sexual activity: Not on file  Other Topics Concern  . Not on file  Social History Narrative  . Not on file    Outpatient Medications Prior to Visit  Medication Sig Dispense Refill  . albuterol (PROVENTIL HFA;VENTOLIN HFA) 108 (90 Base) MCG/ACT inhaler Inhale 1-2 puffs into the lungs every 6 (six) hours as needed for wheezing or shortness of breath. 1 Inhaler 11  . albuterol (PROVENTIL) (2.5 MG/3ML) 0.083% nebulizer solution Take 3 mLs (2.5 mg total) by nebulization every 6 (six) hours as needed for wheezing or shortness of breath. 150 mL 12  . allopurinol (ZYLOPRIM) 100 MG tablet Take 1 tablet (100 mg total) by mouth daily. 30 tablet 3  . atorvastatin (LIPITOR) 40 MG tablet Take 1 tablet (40 mg total) by mouth daily. 30 tablet 3  . BELBUCA 300 MCG FILM 1 Film.    . Buprenorphine HCl (BELBUCA) 150 MCG FILM Place 1 mcg inside cheek daily.    . colchicine 0.6 MG tablet Take 1 tablet (0.6 mg total) by mouth 2 (two) times daily. 30 tablet 3  . DULoxetine (CYMBALTA) 30 MG capsule Take 1 capsule (30 mg total) by mouth 2 (two) times daily. Take 1 tablet once  daily for 7 days, then 2 tablets daily 60 capsule 3  . furosemide (LASIX) 20 MG tablet TAKE 1 TABLET BY MOUTH DAILY 30 tablet 1  . gabapentin (NEURONTIN) 300 MG capsule TAKE 300 MG BY MOUTH 4 TIMES DAILY 240 capsule 3  . HYDROcodone-acetaminophen (NORCO/VICODIN) 5-325 MG tablet Take 1-2 tablets by mouth every 6 (six) hours as needed. 15 tablet 0  . hydrocortisone cream 1 % Apply 1 application topically 2 (two)  times daily. 30 g 3  . levothyroxine (SYNTHROID) 25 MCG tablet Take 1 tablet (25 mcg total) by mouth daily before breakfast. 30 tablet 3  . metoprolol succinate (TOPROL-XL) 25 MG 24 hr tablet Take 1 tablet (25 mg total) by mouth daily. 30 tablet 3  . omeprazole (PRILOSEC) 40 MG capsule Take 1 capsule (40 mg total) by mouth 2 (two) times daily. 60 capsule 3  . oxybutynin (DITROPAN-XL) 10 MG 24 hr tablet Take 1 tablet (10 mg total) by mouth at bedtime. 30 tablet 3  . Respiratory Therapy Supplies (NEBULIZER COMPRESSOR) KIT 1 Units by Does not apply route daily as needed. 1 each 0  . spironolactone-hydrochlorothiazide (ALDACTAZIDE) 25-25 MG tablet Take 1 tablet by mouth daily. 30 tablet 3  . Vitamin D, Ergocalciferol, (DRISDOL) 1.25 MG (50000 UT) CAPS capsule Take 1 capsule (50,000 Units total) by mouth every 7 (seven) days. 5 capsule 3   No facility-administered medications prior to visit.     Allergies  Allergen Reactions  . Other Swelling  . Tetanus Toxoids Swelling  . Tetanus Toxoid, Adsorbed Other (See Comments)    Reaction unknown Reaction unknown    ROS Review of Systems  Constitutional: Negative.   HENT: Negative.   Eyes: Negative.   Respiratory: Positive for shortness of breath (occasional).   Cardiovascular: Negative.   Gastrointestinal: Negative.   Endocrine: Negative.   Genitourinary: Negative.   Musculoskeletal: Negative.   Skin: Negative.   Allergic/Immunologic: Negative.   Neurological: Positive for dizziness (occasional) and headaches (occasional).   Hematological: Negative.   Psychiatric/Behavioral: Negative.    Objective:    Physical Exam  Constitutional: She is oriented to person, place, and time. She appears well-developed and well-nourished.  HENT:  Head: Normocephalic and atraumatic.  Eyes: Conjunctivae are normal.  Neck: Normal range of motion. Neck supple.  Pulmonary/Chest: Effort normal and breath sounds normal.  Abdominal: Soft. Bowel sounds are normal.  Musculoskeletal: Normal range of motion.  Neurological: She is alert and oriented to person, place, and time. She has normal reflexes.  Skin: Skin is warm and dry.  Psychiatric: She has a normal mood and affect. Her behavior is normal. Judgment and thought content normal.  Nursing note and vitals reviewed.   BP 140/82 (BP Location: Left Arm, Patient Position: Sitting, Cuff Size: Large)   Pulse 94   Temp 98.3 F (36.8 C) (Oral)   Ht _0  (1.626 m)   Wt (!) 342 lb 12.8 oz (155.5 kg)   SpO2 92% Comment: walking  BMI 58.84 kg/m  Wt Readings from Last 3 Encounters:  03/17/19 (!) 342 lb 12.8 oz (155.5 kg)  08/30/18 (!) 335 lb (152 kg)  05/03/18 (!) 326 lb (147.9 kg)     Health Maintenance Due  Topic Date Due  . OPHTHALMOLOGY EXAM  02/02/1974  . COLONOSCOPY  02/02/2014  . FOOT EXAM  06/15/2018  . MAMMOGRAM  09/04/2018  . HEMOGLOBIN A1C  11/01/2018  . URINE MICROALBUMIN  05/04/2019    There are no preventive care reminders to display for this patient.  Lab Results  Component Value Date   TSH 4.540 (H) 08/30/2018   Lab Results  Component Value Date   WBC 7.8 08/30/2018   HGB 10.9 (L) 08/30/2018   HCT 34.5 08/30/2018   MCV 80 08/30/2018   PLT 258 08/30/2018   Lab Results  Component Value Date   NA 142 08/30/2018   K 4.3 08/30/2018   CO2 29 08/30/2018   GLUCOSE 84 08/30/2018  BUN 13 08/30/2018   CREATININE 1.06 (H) 08/30/2018   BILITOT 0.7 08/30/2018   ALKPHOS 111 08/30/2018   AST 20 08/30/2018   ALT 17 08/30/2018   PROT 7.1 08/30/2018    ALBUMIN 4.0 08/30/2018   CALCIUM 8.9 08/30/2018   ANIONGAP 14 03/11/2018   GFR 53.02 (L) 12/14/2016   Lab Results  Component Value Date   CHOL 150 08/30/2018   Lab Results  Component Value Date   HDL 39 (L) 08/30/2018   Lab Results  Component Value Date   LDLCALC 73 08/30/2018   Lab Results  Component Value Date   TRIG 190 (H) 08/30/2018   Lab Results  Component Value Date   CHOLHDL 3.8 08/30/2018   Lab Results  Component Value Date   HGBA1C 6.2 (A) 03/17/2019    Assessment & Plan:   1. Class 3 severe obesity due to excess calories with serious comorbidity and body mass index (BMI) of 50.0 to 59.9 in adult [facility]) Body mass index is 58.84 kg/m. Goal BMI  is <30. Encouraged efforts to reduce weight include engaging in physical activity as tolerated with goal of 150 minutes per week. Improve dietary choices and eat a meal regimen consistent with a Mediterranean or DASH diet. Reduce simple carbohydrates. Do not skip meals and eat healthy snacks throughout the day to avoid over-eating at dinner. Set a goal weight loss that is achievable for you.  2. Diabetes mellitus type 2, noninsulin dependent (HCC) Hgb A1c is stable at 6.2 today. She will continue to decrease foods/beverages high in sugars and carbs and follow Heart Healthy or DASH diet. Increase physical activity to at least 30 minutes cardio exercise daily.   3. Essential hypertension The current medical regimen is effective; blood pressure is stable at 140/82 today; continue present plan and medications as prescribed. She will continue to decrease high sodium intake, excessive alcohol intake, increase potassium intake, smoking cessation, and increase physical activity of at least 30 minutes of cardio activity daily. She will continue to follow Heart Healthy or DASH diet.  4. Chronic obstructive pulmonary disease, unspecified COPD type ([place]) She will continue 2 liters of oxygen as needed. No signs or symptoms respiratory  distress noted or reported.   5. Low oxygen saturation  6. Shortness of breath Stable today.   7. Pedal edema  8. Prediabetes - POCT glycosylated hemoglobin (Hb A1C) - POCT urinalysis dipstick - POCT glucose (manual entry)  9. Abnormal urinalysis Results are pending. - Urine Culture  10. Follow up Rx for [place] (walker) written today.  She will follow up in 6 months.   No orders of the defined types were placed in this encounter.   Orders Placed This Encounter  Procedures  . Urine Culture  . POCT glycosylated hemoglobin (Hb A1C)  . POCT urinalysis dipstick  . POCT glucose (manual entry)    Referral Orders  No referral(s) requested today    Kathe Becton,  MSN, FNP-BC [facility] [address] [place], [place] [address] [phone] fax   Current Outpatient Medications on File Prior to Visit  Medication Sig Dispense Refill  . albuterol (PROVENTIL HFA;VENTOLIN HFA) 108 (90 Base) MCG/ACT inhaler Inhale 1-2 puffs into the lungs every 6 (six) hours as needed for wheezing or shortness of breath. 1 Inhaler 11  . albuterol (PROVENTIL) (2.5 MG/3ML) 0.083% nebulizer solution Take 3 mLs (2.5 mg total) by nebulization every 6 (six) hours as needed for wheezing  or shortness of breath. 150 mL 12  . allopurinol (ZYLOPRIM) 100 MG tablet Take 1 tablet (100 mg total) by mouth daily. 30 tablet 3  . atorvastatin (LIPITOR) 40 MG tablet Take 1 tablet (40 mg total) by mouth daily. 30 tablet 3  . BELBUCA 300 MCG FILM 1 Film.    . Buprenorphine HCl (BELBUCA) 150 MCG FILM Place 1 mcg inside cheek daily.    . colchicine 0.6 MG tablet Take 1 tablet (0.6 mg total) by mouth 2 (two) times daily. 30 tablet 3  . DULoxetine (CYMBALTA) 30 MG capsule Take 1 capsule (30 mg total) by mouth 2 (two) times daily. Take 1 tablet once daily for 7 days, then 2 tablets daily 60 capsule 3  . furosemide (LASIX) 20 MG tablet  TAKE 1 TABLET BY MOUTH DAILY 30 tablet 1  . gabapentin (NEURONTIN) 300 MG capsule TAKE 300 MG BY MOUTH 4 TIMES DAILY 240 capsule 3  . HYDROcodone-acetaminophen (NORCO/VICODIN) 5-325 MG tablet Take 1-2 tablets by mouth every 6 (six) hours as needed. 15 tablet 0  . hydrocortisone cream 1 % Apply 1 application topically 2 (two) times daily. 30 g 3  . levothyroxine (SYNTHROID) 25 MCG tablet Take 1 tablet (25 mcg total) by mouth daily before breakfast. 30 tablet 3  . metoprolol succinate (TOPROL-XL) 25 MG 24 hr tablet Take 1 tablet (25 mg total) by mouth daily. 30 tablet 3  . omeprazole (PRILOSEC) 40 MG capsule Take 1 capsule (40 mg total) by mouth 2 (two) times daily. 60 capsule 3  . oxybutynin (DITROPAN-XL) 10 MG 24 hr tablet Take 1 tablet (10 mg total) by mouth at bedtime. 30 tablet 3  . Respiratory Therapy Supplies (NEBULIZER COMPRESSOR) KIT 1 Units by Does not apply route daily as needed. 1 each 0  . spironolactone-hydrochlorothiazide (ALDACTAZIDE) 25-25 MG tablet Take 1 tablet by mouth daily. 30 tablet 3  . Vitamin D, Ergocalciferol, (DRISDOL) 1.25 MG (50000 UT) CAPS capsule Take 1 capsule (50,000 Units total) by mouth every 7 (seven) days. 5 capsule 3   No current facility-administered medications on file prior to visit.     Orders Placed This Encounter  Procedures  . Urine Culture  . POCT glycosylated hemoglobin (Hb A1C)  . POCT urinalysis dipstick  . POCT glucose (manual entry)    Referral Orders  No referral(s) requested today    Kathe Becton,  MSN, FNP-BC [facility] [place], [place] [address] [phone] fax   Problem List Items Addressed This Visit      Cardiovascular and Mediastinum   Essential hypertension     Endocrine   Diabetes mellitus type 2, noninsulin dependent ([place])     Other   Pedal edema    Other Visit Diagnoses    Class 3 severe obesity due to excess  calories with serious comorbidity and body mass index (BMI) of 50.0 to 59.9 in adult [facility])    -  Primary   Chronic obstructive pulmonary disease, unspecified COPD type ([place])       Low oxygen saturation       Shortness of breath       Prediabetes       Relevant Orders   POCT glycosylated hemoglobin (Hb A1C) (Completed)   POCT urinalysis dipstick (Completed)   POCT glucose (manual entry) (Completed)   Abnormal urinalysis       Relevant Orders   Urine Culture   Follow up  No orders of the defined types were placed in this encounter.   Follow-up: Return in about 6 months (around 09/17/2019).    Azzie Glatter, FNP

## 2019-03-19 ENCOUNTER — Encounter: Payer: Self-pay | Admitting: Family Medicine

## 2019-03-19 DIAGNOSIS — R7303 Prediabetes: Secondary | ICD-10-CM | POA: Insufficient documentation

## 2019-04-10 ENCOUNTER — Other Ambulatory Visit: Payer: Self-pay | Admitting: Family Medicine

## 2019-04-10 DIAGNOSIS — R600 Localized edema: Secondary | ICD-10-CM

## 2019-04-14 ENCOUNTER — Encounter (HOSPITAL_COMMUNITY): Payer: Self-pay

## 2019-04-15 ENCOUNTER — Other Ambulatory Visit: Payer: Self-pay | Admitting: Family Medicine

## 2019-04-15 DIAGNOSIS — K219 Gastro-esophageal reflux disease without esophagitis: Secondary | ICD-10-CM

## 2019-04-15 DIAGNOSIS — I5189 Other ill-defined heart diseases: Secondary | ICD-10-CM

## 2019-04-15 DIAGNOSIS — M109 Gout, unspecified: Secondary | ICD-10-CM

## 2019-04-16 ENCOUNTER — Other Ambulatory Visit: Payer: Self-pay | Admitting: Family Medicine

## 2019-04-17 ENCOUNTER — Other Ambulatory Visit: Payer: Self-pay

## 2019-04-17 DIAGNOSIS — K219 Gastro-esophageal reflux disease without esophagitis: Secondary | ICD-10-CM

## 2019-04-17 MED ORDER — OMEPRAZOLE 40 MG PO CPDR: 40.0000 mg | DELAYED_RELEASE_CAPSULE | Freq: Two times a day (BID) | ORAL | 3 refills | Status: DC

## 2019-04-27 ENCOUNTER — Other Ambulatory Visit: Payer: Self-pay

## 2019-04-27 ENCOUNTER — Telehealth: Payer: Self-pay

## 2019-04-27 DIAGNOSIS — J449 Chronic obstructive pulmonary disease, unspecified: Secondary | ICD-10-CM

## 2019-04-27 NOTE — Telephone Encounter (Signed)
Order for oxygen sent to [facility].

## 2019-04-27 NOTE — Telephone Encounter (Signed)
Error

## 2019-05-12 ENCOUNTER — Ambulatory Visit: Payer: Medicaid Other

## 2019-05-12 ENCOUNTER — Other Ambulatory Visit: Payer: Self-pay

## 2019-05-12 ENCOUNTER — Telehealth: Payer: Self-pay

## 2019-05-12 DIAGNOSIS — R7981 Abnormal blood-gas level: Secondary | ICD-10-CM

## 2019-05-12 NOTE — Telephone Encounter (Signed)
Tabitha Scott,  Patient came in today for a walking o2 check for paper work. She started at 88% on room air and while ambulating it dropped to 84%.

## 2019-05-16 ENCOUNTER — Encounter: Payer: Self-pay | Admitting: Family Medicine

## 2019-05-16 NOTE — Progress Notes (Signed)
05/12/2019:   Patient starting o2 was 88% on Room Air.  While ambulating o2 dropped to 84% on Room [employer]

## 2019-05-19 ENCOUNTER — Telehealth: Payer: Self-pay | Admitting: Family Medicine

## 2019-05-19 ENCOUNTER — Telehealth: Payer: Self-pay

## 2019-05-19 NOTE — Telephone Encounter (Signed)
Faxed order for Oxygen to [facility]. Today 05/19/2019 @12 :20pm. Thanks !

## 2019-05-19 NOTE — Telephone Encounter (Signed)
Sent to nurse

## 2019-05-22 ENCOUNTER — Other Ambulatory Visit: Payer: Self-pay

## 2019-05-22 ENCOUNTER — Encounter: Payer: Self-pay | Admitting: Family Medicine

## 2019-05-22 ENCOUNTER — Ambulatory Visit (INDEPENDENT_AMBULATORY_CARE_PROVIDER_SITE_OTHER): Payer: Medicaid Other | Admitting: Family Medicine

## 2019-05-22 VITALS — BP 150/81 | HR 92 | Temp 98.0°F | Ht <= 58 in | Wt 344.4 lb

## 2019-05-22 DIAGNOSIS — Z09 Encounter for follow-up examination after completed treatment for conditions other than malignant neoplasm: Secondary | ICD-10-CM

## 2019-05-22 DIAGNOSIS — R0989 Other specified symptoms and signs involving the circulatory and respiratory systems: Secondary | ICD-10-CM

## 2019-05-22 DIAGNOSIS — J449 Chronic obstructive pulmonary disease, unspecified: Secondary | ICD-10-CM

## 2019-05-22 DIAGNOSIS — Z23 Encounter for immunization: Secondary | ICD-10-CM

## 2019-05-22 DIAGNOSIS — I10 Essential (primary) hypertension: Secondary | ICD-10-CM

## 2019-05-22 DIAGNOSIS — E66813 Obesity, class 3: Secondary | ICD-10-CM

## 2019-05-22 DIAGNOSIS — R0602 Shortness of breath: Secondary | ICD-10-CM

## 2019-05-22 DIAGNOSIS — E559 Vitamin D deficiency, unspecified: Secondary | ICD-10-CM

## 2019-05-22 DIAGNOSIS — R059 Cough, unspecified: Secondary | ICD-10-CM

## 2019-05-22 DIAGNOSIS — E119 Type 2 diabetes mellitus without complications: Secondary | ICD-10-CM | POA: Diagnosis not present

## 2019-05-22 DIAGNOSIS — R7981 Abnormal blood-gas level: Secondary | ICD-10-CM

## 2019-05-22 DIAGNOSIS — R600 Localized edema: Secondary | ICD-10-CM

## 2019-05-22 DIAGNOSIS — R05 Cough: Secondary | ICD-10-CM

## 2019-05-22 DIAGNOSIS — Z6841 Body Mass Index (BMI) 40.0 and over, adult: Secondary | ICD-10-CM

## 2019-05-22 MED ORDER — ALBUTEROL SULFATE (2.5 MG/3ML) 0.083% IN NEBU: 2.5000 mg | INHALATION_SOLUTION | Freq: Four times a day (QID) | RESPIRATORY_TRACT | 12 refills | Status: DC | PRN

## 2019-05-22 MED ORDER — VITAMIN D (ERGOCALCIFEROL) 1.25 MG (50000 UNIT) PO CAPS: 50000.0000 [IU] | ORAL_CAPSULE | ORAL | 3 refills | Status: DC

## 2019-05-22 NOTE — Progress Notes (Signed)
Patient [facility]   Established Patient Office Visit  Subjective:  Patient ID: Tabitha Scott, female    DOB: 22-May-1966  Age: 53 y.o. MRN: [id number]  CC:  Chief Complaint  Patient presents with  . Follow-up    discuss oxygen     HPI Tabitha Scott is a 53 year old female who presents for Follow Up today.   Past Medical History:  Diagnosis Date  . Arthritis   . Carpal tunnel syndrome   . Chronic knee pain   . Chronic left shoulder pain   . Chronic lower back pain   . COPD (chronic obstructive pulmonary disease) ([place])    pt states she does not have COPD  . Depression   . GERD (gastroesophageal reflux disease)   . Gout   . High cholesterol   . Hyperlipidemia   . Hypertension    no longer on medications - stopped taking 4 years ago  . Hypothyroidism   . Low iron   . Neuropathy   . On home oxygen therapy    "have it available; quit smoking; not using it" (04/16/2016)  . OSA (obstructive sleep apnea)    not using cpap  . Pneumonia    03/10/18 - "yeas ago"  . Prediabetes 02/2019  . Thyroid disease   . Type II diabetes mellitus ([place])    Pt was told she did not have diabetes, was treated at one time   Current Status: Since her last office visit, she is doing well with no complaints.  Patient requires home oxygen concentrator with portability 2 LPM via nasal cannula. Home oxygen is required because of her medical condition of COPD, Obstructive Sleep Apnea, and Shortness of Breath. Home oxygen will prevent episodes of Hypoxia and COPD exacerbations. Her oxygen satuations at rest is 88% on room air. When ambulating, oxygen saturations dropped to 84%. She reports occasional shortness of breath, and dizziness. She denies visual changes, chest pain, cough, heart palpitations, and falls. She has occasional headaches and dizziness with position changes. Denies severe headaches, confusion, seizures, double vision, and blurred  vision, nausea and vomiting. She denies fatigue, frequent urination, blurred vision, excessive hunger, excessive thirst, weight gain, weight loss, and poor wound healing. She continues to check her feet regularly. She denies fevers, chills, fatigue, recent infections, weight loss, and night sweats. No reports of GI problems such as nausea, vomiting, diarrhea, and constipation. She has no reports of blood in stools, dysuria and hematuria. No depression or anxiety reported. She denies pain today.   Past Surgical History:  Procedure Laterality Date  . CARPAL TUNNEL RELEASE Bilateral 2009  . CHOLECYSTECTOMY    . COLONOSCOPY    . DILATION AND CURETTAGE OF UTERUS    . JOINT REPLACEMENT Left 2015   thumb  . KNEE SURGERY Left 1970s   tissue removed  . LACRIMAL DUCT EXPLORATION Right 12/09/2017   Procedure: DACROCYSTORHINOSTOMY WITH ANTERIOR ETHMOIDECTOMY AND PUNCTOPLASTY, PROBING AND STENT PLACEMENT RIGHT EYE;  Surgeon: Clista Bernhardt, MD;  Location: [facility];  Service: Ophthalmology;  Laterality: Right;  . LAPAROSCOPIC CHOLECYSTECTOMY  ~ 2014  . NEUROPLASTY / TRANSPOSITION ULNAR NERVE AT ELBOW Bilateral 2009   nerver repair to both arms   . TUBAL LIGATION    . WRIST ARTHROSCOPY WITH CARPOMETACARPEL [facility]) ARTHROPLASTY Right 03/11/2018   Procedure: RIGHT THUMB CARPOMETACARPEL ([place]) ARTHROPLASTY WITH ABDUCTOR POLLICIS LONGUS TRANSFER, RIGHT WRIST STENOSING TENOSYNOVITIS RELEASE;  Surgeon: Charlotte Crumb, MD;  Location: [facility]  OR;  Service: Orthopedics;  Laterality: Right;    Family History  Problem Relation Age of Onset  . COPD Mother   . Heart disease Father   . Cancer Maternal Grandmother     Social History   Socioeconomic History  . Marital status: Divorced    Spouse name: Not on file  . Number of children: Not on file  . Years of education: Not on file  . Highest education level: Not on file  Occupational History  . Not on file  Social Needs  . Financial resource strain: Not on file   . Food insecurity    Worry: Not on file    Inability: Not on file  . Transportation needs    Medical: Not on file    Non-medical: Not on file  Tobacco Use  . Smoking status: Former Smoker    Packs/day: 1.50    Years: 41.00    Pack years: 61.50    Types: Cigarettes    Quit date: 02/11/2015    Years since quitting: 4.2  . Smokeless tobacco: Never Used  Substance and Sexual Activity  . Alcohol use: No    Alcohol/week: 0.0 standard drinks  . Drug use: No  . Sexual activity: Never  Lifestyle  . Physical activity    Days per week: Not on file    Minutes per session: Not on file  . Stress: Not on file  Relationships  . Social Herbalist on phone: Not on file    Gets together: Not on file    Attends religious service: Not on file    Active member of club or organization: Not on file    Attends meetings of clubs or organizations: Not on file    Relationship status: Not on file  . Intimate partner violence    Fear of current or ex partner: Not on file    Emotionally abused: Not on file    Physically abused: Not on file    Forced sexual activity: Not on file  Other Topics Concern  . Not on file  Social History Narrative  . Not on file    Outpatient Medications Prior to Visit  Medication Sig Dispense Refill  . albuterol (PROVENTIL HFA;VENTOLIN HFA) 108 (90 Base) MCG/ACT inhaler Inhale 1-2 puffs into the lungs every 6 (six) hours as needed for wheezing or shortness of breath. 1 Inhaler 11  . allopurinol (ZYLOPRIM) 100 MG tablet TAKE 1 TABLET BY MOUTH DAILY 30 tablet 3  . atorvastatin (LIPITOR) 40 MG tablet Take 1 tablet (40 mg total) by mouth daily. 30 tablet 3  . colchicine 0.6 MG tablet Take 1 tablet (0.6 mg total) by mouth 2 (two) times daily. 30 tablet 3  . furosemide (LASIX) 20 MG tablet TAKE 1 TABLET BY MOUTH DAILY 30 tablet 1  . gabapentin (NEURONTIN) 300 MG capsule TAKE 300 MG BY MOUTH 4 TIMES DAILY 240 capsule 3  . HYDROcodone-acetaminophen (NORCO/VICODIN)  5-325 MG tablet Take 1-2 tablets by mouth every 6 (six) hours as needed. 15 tablet 0  . levothyroxine (SYNTHROID) 25 MCG tablet TAKE 1 TABLET BY MOUTH DAILY BEFORE BREAKFAST 30 tablet 3  . metoprolol succinate (TOPROL-XL) 25 MG 24 hr tablet TAKE 1 TABLET BY MOUTH EVERY DAY 30 tablet 3  . omeprazole (PRILOSEC) 40 MG capsule Take 1 capsule (40 mg total) by mouth 2 (two) times daily. 60 capsule 3  . oxybutynin (DITROPAN-XL) 10 MG 24 hr tablet TAKE 1 TABLET BY MOUTH AT BEDTIME  30 tablet 3  . spironolactone-hydrochlorothiazide (ALDACTAZIDE) 25-25 MG tablet TAKE 1 TABLET BY MOUTH EVERY DAY 30 tablet 3  . Vitamin D, Ergocalciferol, (DRISDOL) 1.25 MG (50000 UT) CAPS capsule Take 1 capsule (50,000 Units total) by mouth every 7 (seven) days. 5 capsule 3  . Respiratory Therapy Supplies (NEBULIZER COMPRESSOR) KIT 1 Units by Does not apply route daily as needed. (Patient not taking: Reported on 05/22/2019) 1 each 0  . albuterol (PROVENTIL) (2.5 MG/3ML) 0.083% nebulizer solution Take 3 mLs (2.5 mg total) by nebulization every 6 (six) hours as needed for wheezing or shortness of breath. (Patient not taking: Reported on 05/22/2019) 150 mL 12  . BELBUCA 300 MCG FILM 1 Film.    . Buprenorphine HCl (BELBUCA) 150 MCG FILM Place 1 mcg inside cheek daily.    . DULoxetine (CYMBALTA) 30 MG capsule Take 1 capsule (30 mg total) by mouth 2 (two) times daily. Take 1 tablet once daily for 7 days, then 2 tablets daily (Patient not taking: Reported on 05/22/2019) 60 capsule 3  . hydrocortisone cream 1 % Apply 1 application topically 2 (two) times daily. (Patient not taking: Reported on 05/22/2019) 30 g 3   No facility-administered medications prior to visit.     Allergies  Allergen Reactions  . Other Swelling  . Tetanus Toxoids Swelling  . Tetanus Toxoid, Adsorbed Other (See Comments)    Reaction unknown Reaction unknown    ROS Review of Systems  Constitutional: Negative.   HENT: Negative.   Eyes: Negative.    Respiratory: Positive for cough (occasional ) and shortness of breath (occasional ).   Cardiovascular: Negative.   Gastrointestinal: Negative.   Endocrine: Negative.   Genitourinary: Negative.   Musculoskeletal: Negative.   Skin: Negative.   Allergic/Immunologic: Negative.   Neurological: Negative.   Hematological: Negative.   Psychiatric/Behavioral: Negative.       Objective:    Physical Exam  Constitutional: She is oriented to person, place, and time. She appears well-developed and well-nourished.  HENT:  Head: Normocephalic and atraumatic.  Eyes: Conjunctivae are normal.  Neck: Normal range of motion. Neck supple.  Cardiovascular: Normal rate, regular rhythm, normal heart sounds and intact distal pulses.  Pulmonary/Chest: Effort normal and breath sounds normal.  Abdominal: Soft. Bowel sounds are normal. She exhibits distension (obese).  Musculoskeletal: Normal range of motion.  Neurological: She is alert and oriented to person, place, and time. She has normal reflexes.  Skin: Skin is warm.  Psychiatric: She has a normal mood and affect. Her behavior is normal. Judgment and thought content normal.    BP (!) 150/81 (BP Location: Right Arm, Patient Position: Sitting, Cuff Size: Normal)   Pulse 92   Temp 98 F (36.7 C) (Oral)   Ht 1' (0.305 m)   Wt (!) 344 lb 6.4 oz (156.2 kg)   SpO2 (!) 89%   BMI 1681.53 kg/m  Wt Readings from Last 3 Encounters:  05/22/19 (!) 344 lb 6.4 oz (156.2 kg)  03/17/19 (!) 342 lb 12.8 oz (155.5 kg)  08/30/18 (!) 335 lb (152 kg)     Health Maintenance Due  Topic Date Due  . OPHTHALMOLOGY EXAM  02/02/1974  . COLONOSCOPY  02/02/2014  . FOOT EXAM  06/15/2018  . MAMMOGRAM  09/04/2018  . INFLUENZA VACCINE  03/25/2019  . URINE MICROALBUMIN  05/04/2019    There are no preventive care reminders to display for this patient.  Lab Results  Component Value Date   TSH 4.540 (H) 08/30/2018   Lab Results  Component Value Date   WBC 7.8  08/30/2018   HGB 10.9 (L) 08/30/2018   HCT 34.5 08/30/2018   MCV 80 08/30/2018   PLT 258 08/30/2018   Lab Results  Component Value Date   NA 142 08/30/2018   K 4.3 08/30/2018   CO2 29 08/30/2018   GLUCOSE 84 08/30/2018   BUN 13 08/30/2018   CREATININE 1.06 (H) 08/30/2018   BILITOT 0.7 08/30/2018   ALKPHOS 111 08/30/2018   AST 20 08/30/2018   ALT 17 08/30/2018   PROT 7.1 08/30/2018   ALBUMIN 4.0 08/30/2018   CALCIUM 8.9 08/30/2018   ANIONGAP 14 03/11/2018   GFR 53.02 (L) 12/14/2016   Lab Results  Component Value Date   CHOL 150 08/30/2018   Lab Results  Component Value Date   HDL 39 (L) 08/30/2018   Lab Results  Component Value Date   LDLCALC 73 08/30/2018   Lab Results  Component Value Date   TRIG 190 (H) 08/30/2018   Lab Results  Component Value Date   CHOLHDL 3.8 08/30/2018   Lab Results  Component Value Date   HGBA1C 6.2 (A) 03/17/2019      Assessment & Plan:   1. Chronic obstructive pulmonary disease, unspecified COPD type ([place]) Stable.  - For home use only DME Nebulizer machine - albuterol (PROVENTIL) (2.5 MG/3ML) 0.083% nebulizer solution; Take 3 mLs (2.5 mg total) by nebulization every 6 (six) hours as needed for wheezing or shortness of breath.  Dispense: 150 mL; Refill: 12  2. Class 3 severe obesity due to excess calories with serious comorbidity and body mass index (BMI) of 50.0 to 59.9 in adult [facility]) Body mass index is 1,681.53 kg/m. Goal BMI  is <30. Encouraged efforts to reduce weight include engaging in physical activity as tolerated with goal of 150 minutes per week. Improve dietary choices and eat a meal regimen consistent with a Mediterranean or DASH diet. Reduce simple carbohydrates. Do not skip meals and eat healthy snacks throughout the day to avoid over-eating at dinner. Set a goal weight loss that is achievable for you.  3. Diabetes mellitus type 2, noninsulin dependent ([place]) She will continue to decrease foods/beverages high in  sugars and carbs and follow Heart Healthy or DASH diet. Increase physical activity to at least 30 minutes cardio exercise daily.   4. Essential hypertension Blood pressure is stable today. She will continue to decrease high sodium intake, excessive alcohol intake, increase potassium intake, smoking cessation, and increase physical activity of at least 30 minutes of cardio activity daily. She will continue to follow Heart Healthy or DASH diet.   5. Low oxygen saturation Today we will send order for Continuous Home Oxygen via Nasal Cannula at 2 liters. We will also send order for Nebulizer machine to be provided by [facility].  - For home use only DME Nebulizer machine  6. Shortness of breath Stable today. No signs or symptoms of respiratory distress noted or reported.  - For home use only DME Nebulizer machine  7. Chest congestion - albuterol (PROVENTIL) (2.5 MG/3ML) 0.083% nebulizer solution; Take 3 mLs (2.5 mg total) by nebulization every 6 (six) hours as needed for wheezing or shortness of breath.  Dispense: 150 mL; Refill: 12  8. Cough - albuterol (PROVENTIL) (2.5 MG/3ML) 0.083% nebulizer solution; Take 3 mLs (2.5 mg total) by nebulization every 6 (six) hours as needed for wheezing or shortness of breath.  Dispense: 150 mL; Refill: 12  9. Pedal edema +1 lower extremity edema  noted.   10. Need for influenza vaccination - Flu Vaccine QUAD 6+ mos PF IM (Fluarix Quad PF)  11. Vitamin D deficiency - Vitamin D, Ergocalciferol, (DRISDOL) 1.25 MG (50000 UT) CAPS capsule; Take 1 capsule (50,000 Units total) by mouth every 7 (seven) days.  Dispense: 5 capsule; Refill: 3  12. Follow up She will keep previously schedule follow up appointment.   Meds ordered this encounter  Medications  . Vitamin D, Ergocalciferol, (DRISDOL) 1.25 MG (50000 UT) CAPS capsule    Sig: Take 1 capsule (50,000 Units total) by mouth every 7 (seven) days.    Dispense:  5 capsule    Refill:  3  . albuterol  (PROVENTIL) (2.5 MG/3ML) 0.083% nebulizer solution    Sig: Take 3 mLs (2.5 mg total) by nebulization every 6 (six) hours as needed for wheezing or shortness of breath.    Dispense:  150 mL    Refill:  12    Orders Placed This Encounter  Procedures  . For home use only DME Nebulizer machine  . Flu Vaccine QUAD 6+ mos PF IM (Fluarix Quad PF)    Referral Orders  No referral(s) requested today    Kathe Becton,  MSN, FNP-BC [place], [place] [address] [phone] fax    Problem List Items Addressed This Visit      Cardiovascular and Mediastinum   Essential hypertension     Endocrine   Diabetes mellitus type 2, noninsulin dependent ([place])     Other   Class 3 severe obesity due to excess calories with serious comorbidity and body mass index (BMI) of 50.0 to 59.9 in adult ([facility])   Cough   Relevant Medications   albuterol (PROVENTIL) (2.5 MG/3ML) 0.083% nebulizer solution   Pedal edema    Other Visit Diagnoses    Chronic obstructive pulmonary disease, unspecified COPD type ([place])    -  Primary   Relevant Medications   albuterol (PROVENTIL) (2.5 MG/3ML) 0.083% nebulizer solution   Other Relevant Orders   For home use only DME Nebulizer machine   Low oxygen saturation       Relevant Orders   For home use only DME Nebulizer machine   Shortness of breath       Relevant Orders   For home use only DME Nebulizer machine   Chest congestion       Relevant Medications   albuterol (PROVENTIL) (2.5 MG/3ML) 0.083% nebulizer solution   Need for influenza vaccination       Relevant Orders   Flu Vaccine QUAD 6+ mos PF IM (Fluarix Quad PF)   Vitamin D deficiency       Relevant Medications   Vitamin D, Ergocalciferol, (DRISDOL) 1.25 MG (50000 UT) CAPS capsule   Follow up          Meds ordered this encounter  Medications  . Vitamin D, Ergocalciferol, (DRISDOL) 1.25 MG  (50000 UT) CAPS capsule    Sig: Take 1 capsule (50,000 Units total) by mouth every 7 (seven) days.    Dispense:  5 capsule    Refill:  3  . albuterol (PROVENTIL) (2.5 MG/3ML) 0.083% nebulizer solution    Sig: Take 3 mLs (2.5 mg total) by nebulization every 6 (six) hours as needed for wheezing or shortness of breath.    Dispense:  150 mL    Refill:  12    Follow-up: No follow-ups on file.  Azzie Glatter, FNP

## 2019-05-31 ENCOUNTER — Encounter: Payer: Self-pay | Admitting: Family Medicine

## 2019-06-06 ENCOUNTER — Telehealth: Payer: Self-pay | Admitting: Internal Medicine

## 2019-06-06 NOTE — Telephone Encounter (Signed)
Called and spoke with patient. She says Dr. Royce Macadamia who asked her to stop taking spirolactone is her Nephrologist and asked her to stop it due to kidney function.

## 2019-06-08 NOTE — Telephone Encounter (Signed)
Patient made aware that spironolactone needs to be discontinued. Thanks!

## 2019-06-25 DIAGNOSIS — Z8669 Personal history of other diseases of the nervous system and sense organs: Secondary | ICD-10-CM

## 2019-06-25 HISTORY — DX: Personal history of other diseases of the nervous system and sense organs: Z86.69

## 2019-06-26 ENCOUNTER — Telehealth: Payer: Self-pay

## 2019-06-26 ENCOUNTER — Other Ambulatory Visit: Payer: Self-pay | Admitting: Family Medicine

## 2019-06-26 ENCOUNTER — Encounter: Payer: Self-pay | Admitting: Family Medicine

## 2019-06-26 DIAGNOSIS — Z8669 Personal history of other diseases of the nervous system and sense organs: Secondary | ICD-10-CM

## 2019-06-26 NOTE — Telephone Encounter (Signed)
Patient called and is asking for an order for a sleep study. She has had sleep apena in the past. Please advise. Thanks!

## 2019-06-27 MED ORDER — MAGNESIUM OXIDE 400 MG PO TABS
400.00 | ORAL_TABLET | ORAL | Status: DC
Start: 2019-06-27 — End: 2019-06-27

## 2019-06-27 MED ORDER — ONDANSETRON HCL 4 MG/2ML IJ SOLN
4.00 | INTRAMUSCULAR | Status: DC
Start: ? — End: 2019-06-27

## 2019-06-27 MED ORDER — METHYLPREDNISOLONE SODIUM SUCC 40 MG IJ SOLR
40.00 | INTRAMUSCULAR | Status: DC
Start: 2019-06-28 — End: 2019-06-27

## 2019-06-27 MED ORDER — DEXTROMETHORPHAN-GUAIFENESIN 10-100 MG/5ML PO SYRP
5.00 | ORAL_SOLUTION | ORAL | Status: DC
Start: ? — End: 2019-06-27

## 2019-06-27 MED ORDER — ATORVASTATIN CALCIUM 40 MG PO TABS
40.00 | ORAL_TABLET | ORAL | Status: DC
Start: 2019-06-28 — End: 2019-06-27

## 2019-06-27 MED ORDER — PROMETHAZINE HCL 25 MG PO TABS
25.00 | ORAL_TABLET | ORAL | Status: DC
Start: ? — End: 2019-06-27

## 2019-06-27 MED ORDER — ALBUTEROL SULFATE (2.5 MG/3ML) 0.083% IN NEBU
2.50 | INHALATION_SOLUTION | RESPIRATORY_TRACT | Status: DC
Start: 2019-06-27 — End: 2019-06-27

## 2019-06-27 MED ORDER — ALBUTEROL SULFATE HFA 108 (90 BASE) MCG/ACT IN AERS
2.00 | INHALATION_SPRAY | RESPIRATORY_TRACT | Status: DC
Start: ? — End: 2019-06-27

## 2019-06-27 MED ORDER — TIZANIDINE HCL 4 MG PO TABS
4.00 | ORAL_TABLET | ORAL | Status: DC
Start: ? — End: 2019-06-27

## 2019-06-27 MED ORDER — PANTOPRAZOLE SODIUM 40 MG PO TBEC
40.00 | DELAYED_RELEASE_TABLET | ORAL | Status: DC
Start: 2019-06-28 — End: 2019-06-27

## 2019-06-27 MED ORDER — ALLOPURINOL 100 MG PO TABS
100.00 | ORAL_TABLET | ORAL | Status: DC
Start: 2019-06-28 — End: 2019-06-27

## 2019-06-27 MED ORDER — OXYBUTYNIN CHLORIDE ER 5 MG PO TB24
10.00 | ORAL_TABLET | ORAL | Status: DC
Start: 2019-06-28 — End: 2019-06-27

## 2019-06-27 MED ORDER — HYDROCODONE-ACETAMINOPHEN 5-325 MG PO TABS
1.00 | ORAL_TABLET | ORAL | Status: DC
Start: ? — End: 2019-06-27

## 2019-06-27 MED ORDER — ENOXAPARIN SODIUM 40 MG/0.4ML ~~LOC~~ SOLN
40.00 | SUBCUTANEOUS | Status: DC
Start: 2019-06-27 — End: 2019-06-27

## 2019-06-27 MED ORDER — LEVOTHYROXINE SODIUM 25 MCG PO TABS
25.00 | ORAL_TABLET | ORAL | Status: DC
Start: 2019-06-28 — End: 2019-06-27

## 2019-06-27 MED ORDER — GENERIC EXTERNAL MEDICATION
1.00 | Status: DC
Start: 2019-06-27 — End: 2019-06-27

## 2019-06-27 MED ORDER — ACETAMINOPHEN 325 MG PO TABS
650.00 | ORAL_TABLET | ORAL | Status: DC
Start: ? — End: 2019-06-27

## 2019-06-27 MED ORDER — METOPROLOL SUCCINATE ER 25 MG PO TB24
25.00 | ORAL_TABLET | ORAL | Status: DC
Start: 2019-06-28 — End: 2019-06-27

## 2019-06-27 MED ORDER — DULOXETINE HCL 30 MG PO CPEP
30.00 | ORAL_CAPSULE | ORAL | Status: DC
Start: 2019-06-27 — End: 2019-06-27

## 2019-07-02 ENCOUNTER — Other Ambulatory Visit: Payer: Self-pay | Admitting: Family Medicine

## 2019-07-02 DIAGNOSIS — R600 Localized edema: Secondary | ICD-10-CM

## 2019-07-03 ENCOUNTER — Other Ambulatory Visit: Payer: Self-pay | Admitting: Family Medicine

## 2019-07-03 DIAGNOSIS — R600 Localized edema: Secondary | ICD-10-CM

## 2019-07-15 ENCOUNTER — Other Ambulatory Visit (HOSPITAL_COMMUNITY)
Admission: RE | Admit: 2019-07-15 | Discharge: 2019-07-15 | Disposition: A | Discharge status: Home / Self Care | Payer: Medicaid Other | Source: Ambulatory Visit | Attending: Internal Medicine | Admitting: Internal Medicine

## 2019-07-15 DIAGNOSIS — Z20828 Contact with and (suspected) exposure to other viral communicable diseases: Secondary | ICD-10-CM | POA: Insufficient documentation

## 2019-07-15 DIAGNOSIS — Z01812 Encounter for preprocedural laboratory examination: Secondary | ICD-10-CM | POA: Insufficient documentation

## 2019-07-17 LAB — NOVEL CORONAVIRUS, NAA (HOSP ORDER, SEND-OUT TO REF LAB; TAT 18-24 HRS): SARS-CoV-2, NAA: NOT DETECTED

## 2019-07-18 ENCOUNTER — Other Ambulatory Visit: Payer: Self-pay

## 2019-07-18 ENCOUNTER — Ambulatory Visit (HOSPITAL_BASED_OUTPATIENT_CLINIC_OR_DEPARTMENT_OTHER): Payer: Medicaid Other | Attending: Family Medicine | Admitting: Internal Medicine

## 2019-07-18 VITALS — Temp 97.2°F | Ht 64.0 in | Wt 345.0 lb

## 2019-07-18 DIAGNOSIS — Z8669 Personal history of other diseases of the nervous system and sense organs: Secondary | ICD-10-CM | POA: Insufficient documentation

## 2019-07-18 DIAGNOSIS — G4733 Obstructive sleep apnea (adult) (pediatric): Secondary | ICD-10-CM

## 2019-07-19 ENCOUNTER — Other Ambulatory Visit (HOSPITAL_BASED_OUTPATIENT_CLINIC_OR_DEPARTMENT_OTHER): Payer: Self-pay

## 2019-07-19 DIAGNOSIS — Z8669 Personal history of other diseases of the nervous system and sense organs: Secondary | ICD-10-CM

## 2019-07-29 DIAGNOSIS — Z8669 Personal history of other diseases of the nervous system and sense organs: Secondary | ICD-10-CM | POA: Diagnosis not present

## 2019-07-29 DIAGNOSIS — G4733 Obstructive sleep apnea (adult) (pediatric): Secondary | ICD-10-CM | POA: Diagnosis not present

## 2019-07-30 NOTE — Procedures (Signed)
Patient Name: Tabitha Scott, [place] Date: 07/18/2019 Gender: Female D.O.B: 1966-05-04 Age (years): 33 Referring Provider: Azzie Glatter FNP Height (inches): 61 Interpreting Physician: Baird Lyons MD, ABSM Weight (lbs): 345 RPSGT: Zadie Rhine BMI: 47 MRN: [id number] Neck Size: 19.00  CLINICAL INFORMATION Sleep Study Type: Split Night CPAP Indication for sleep study: OSA Epworth Sleepiness Score: 5  SLEEP STUDY TECHNIQUE As per the AASM Manual for the Scoring of Sleep and Associated Events v2.3 (April 2016) with a hypopnea requiring 4% desaturations.  The channels recorded and monitored were frontal, central and occipital EEG, electrooculogram (EOG), submentalis EMG (chin), nasal and oral airflow, thoracic and abdominal wall motion, anterior tibialis EMG, snore microphone, electrocardiogram, and pulse oximetry. Continuous positive airway pressure (CPAP) was initiated when the patient met split night criteria and was titrated according to treat sleep-disordered breathing.  MEDICATIONS Medications self-administered by patient taken the night of the study : GABAPENTINE, METOLAZONE, OMEEPRAZOLE, PULOXETINE, TIZANIDINE  RESPIRATORY PARAMETERS Diagnostic  Total AHI (/hr): 34.7 RDI (/hr): 42.5 OA Index (/hr): 34.2 CA Index (/hr): 0.0 REM AHI (/hr): 26.3 NREM AHI (/hr): 37.6 Supine AHI (/hr): N/A Non-supine AHI (/hr): 34.7 Min O2 Sat (%): 51.0 Mean O2 (%): 93.4 Time below 88% (min): 16.1   Titration  Optimal Pressure (cm): 18 AHI at Optimal Pressure (/hr): 0.0 Min O2 at Optimal Pressure (%): 82.0 Supine % at Optimal (%): 0 Sleep % at Optimal (%): 100   SLEEP ARCHITECTURE The recording time for the entire night was 398.8 minutes.  During a baseline period of 194.5 minutes, the patient slept for 122.9 minutes in REM and nonREM, yielding a sleep efficiency of 63.2%%. Sleep onset after lights out was 71.1 minutes with a REM latency of 91.0 minutes. The patient spent 1.6%% of the  night in stage N1 sleep, 39.5%% in stage N2 sleep, 32.9%% in stage N3 and 26% in REM.  During the titration period of 202.5 minutes, the patient slept for 196.2 minutes in REM and nonREM, yielding a sleep efficiency of 96.9%%. Sleep onset after CPAP initiation was 5.8 minutes with a REM latency of 63.5 minutes. The patient spent 0.5%% of the night in stage N1 sleep, 26.0%% in stage N2 sleep, 40.8%% in stage N3 and 32.7% in REM.  CARDIAC DATA The 2 lead EKG demonstrated sinus rhythm. The mean heart rate was 100.0 beats per minute. Other EKG findings include: None.  LEG MOVEMENT DATA The total Periodic Limb Movements of Sleep (PLMS) were 0. The PLMS index was 0.0 .  IMPRESSIONS - Severe obstructive sleep apnea occurred during the diagnostic portion of the study (AHI = 34.7/hour). An optimal PAP pressure was selected for this patient ( 18 cm of water) - No significant central sleep apnea occurred during the diagnostic portion of the study (CAI = 0.0/hour). - Severe oxygen desaturation was noted during the diagnostic portion of the study (Min O2 = 51.0%). - Oxygen desaturation into 40% range, especially during REM, was not adequately controlled by CPAP and required supplementation with O2 3L. - Mean oxygen saturation at CPAP 18 with O2 3L supplement was 91.2%. - Snoring was audible during the diagnostic portion of the study. - No cardiac abnormalities were noted during this study. - Clinically significant periodic limb movements did not occur during sleep.  DIAGNOSIS - Obstructive Sleep Apnea (327.23 [G47.33 ICD-10]) - Nocturnal Hypoxemia  RECOMMENDATIONS - Trial of CPAP therapy on 18 cm H2O or autopap 15-20. Supplemental O2 3L bleed in through CPAP. - Patient used a Medium  size Fisher&Paykel Full Face Mask F&P Vitera (new) mask and heated humidification. - Be careful with alcohol, sedatives and other CNS depressants that may worsen sleep apnea and disrupt normal sleep architecture. - Sleep  hygiene should be reviewed to assess factors that may improve sleep quality. - Weight management and regular exercise should be initiated or continued.  [Electronically signed] 07/30/2019 11:44 AM  Baird Lyons MD, ABSM Diplomate, American Board of Sleep Medicine   NPI: [id number]                         [place], [place] [facility]  ELECTRONICALLY SIGNED ON:  07/30/2019, 11:37 AM [place] PH: [phone]   FX: [phone] [place]

## 2019-08-15 ENCOUNTER — Other Ambulatory Visit: Payer: Self-pay | Admitting: Family Medicine

## 2019-08-15 DIAGNOSIS — M109 Gout, unspecified: Secondary | ICD-10-CM

## 2019-08-15 DIAGNOSIS — K219 Gastro-esophageal reflux disease without esophagitis: Secondary | ICD-10-CM

## 2019-09-05 ENCOUNTER — Telehealth: Payer: Self-pay

## 2019-09-05 ENCOUNTER — Telehealth: Payer: Self-pay | Admitting: Family Medicine

## 2019-09-05 NOTE — Telephone Encounter (Signed)
Patient seeking a referral to [facility] because she now  lives in [place]. Also a referral to a Pulmonary MD in [facility].   She also needs a letter for the power company for use of Oxygen.

## 2019-09-05 NOTE — Telephone Encounter (Signed)
Patient aware the information will be faxed today.

## 2019-09-07 ENCOUNTER — Telehealth: Payer: Self-pay

## 2019-09-07 NOTE — Telephone Encounter (Signed)
Patient informed and understood.

## 2019-09-08 ENCOUNTER — Other Ambulatory Visit: Payer: Self-pay | Admitting: Family Medicine

## 2019-09-08 DIAGNOSIS — G629 Polyneuropathy, unspecified: Secondary | ICD-10-CM

## 2019-09-08 NOTE — Telephone Encounter (Signed)
Request for Rx Gabapentin 300 MG

## 2019-09-17 ENCOUNTER — Other Ambulatory Visit: Payer: Self-pay | Admitting: Family Medicine

## 2019-09-18 ENCOUNTER — Ambulatory Visit (INDEPENDENT_AMBULATORY_CARE_PROVIDER_SITE_OTHER): Payer: Medicaid Other | Admitting: Family Medicine

## 2019-09-18 ENCOUNTER — Encounter: Payer: Self-pay | Admitting: Family Medicine

## 2019-09-18 VITALS — BP 147/61 | HR 86 | Temp 98.0°F | Ht 64.0 in | Wt 359.2 lb

## 2019-09-18 DIAGNOSIS — I10 Essential (primary) hypertension: Secondary | ICD-10-CM

## 2019-09-18 DIAGNOSIS — R7303 Prediabetes: Secondary | ICD-10-CM

## 2019-09-18 DIAGNOSIS — R0602 Shortness of breath: Secondary | ICD-10-CM

## 2019-09-18 DIAGNOSIS — Z6841 Body Mass Index (BMI) 40.0 and over, adult: Secondary | ICD-10-CM

## 2019-09-18 DIAGNOSIS — R350 Frequency of micturition: Secondary | ICD-10-CM

## 2019-09-18 DIAGNOSIS — E66813 Obesity, class 3: Secondary | ICD-10-CM

## 2019-09-18 DIAGNOSIS — R600 Localized edema: Secondary | ICD-10-CM

## 2019-09-18 DIAGNOSIS — J449 Chronic obstructive pulmonary disease, unspecified: Secondary | ICD-10-CM

## 2019-09-18 DIAGNOSIS — R0989 Other specified symptoms and signs involving the circulatory and respiratory systems: Secondary | ICD-10-CM

## 2019-09-18 DIAGNOSIS — Z8669 Personal history of other diseases of the nervous system and sense organs: Secondary | ICD-10-CM | POA: Diagnosis not present

## 2019-09-18 DIAGNOSIS — R05 Cough: Secondary | ICD-10-CM

## 2019-09-18 DIAGNOSIS — M109 Gout, unspecified: Secondary | ICD-10-CM

## 2019-09-18 DIAGNOSIS — E785 Hyperlipidemia, unspecified: Secondary | ICD-10-CM

## 2019-09-18 DIAGNOSIS — R059 Cough, unspecified: Secondary | ICD-10-CM

## 2019-09-18 DIAGNOSIS — E039 Hypothyroidism, unspecified: Secondary | ICD-10-CM

## 2019-09-18 DIAGNOSIS — K219 Gastro-esophageal reflux disease without esophagitis: Secondary | ICD-10-CM

## 2019-09-18 DIAGNOSIS — Z09 Encounter for follow-up examination after completed treatment for conditions other than malignant neoplasm: Secondary | ICD-10-CM

## 2019-09-18 LAB — POCT GLYCOSYLATED HEMOGLOBIN (HGB A1C): Hemoglobin A1C: 6.3 % — AB (ref 4.0–5.6)

## 2019-09-18 LAB — POCT URINALYSIS DIPSTICK
Bilirubin, UA: NEGATIVE
Blood, UA: NEGATIVE
Glucose, UA: NEGATIVE
Ketones, UA: NEGATIVE
Nitrite, UA: NEGATIVE
Protein, UA: NEGATIVE
Spec Grav, UA: 1.015 (ref 1.010–1.025)
Urobilinogen, UA: NEGATIVE E.U./dL — AB
pH, UA: 6 (ref 5.0–8.0)

## 2019-09-18 LAB — GLUCOSE, POCT (MANUAL RESULT ENTRY): POC Glucose: 120 mg/dl — AB (ref 70–99)

## 2019-09-18 MED ORDER — ALBUTEROL SULFATE HFA 108 (90 BASE) MCG/ACT IN AERS: 1.0000 | INHALATION_SPRAY | Freq: Four times a day (QID) | RESPIRATORY_TRACT | 12 refills | Status: AC | PRN

## 2019-09-18 MED ORDER — LEVOTHYROXINE SODIUM 25 MCG PO TABS: ORAL_TABLET | ORAL | 1 refills | Status: DC

## 2019-09-18 MED ORDER — METOPROLOL SUCCINATE ER 25 MG PO TB24: 25.0000 mg | ORAL_TABLET | Freq: Every day | ORAL | 1 refills | Status: DC

## 2019-09-18 MED ORDER — OMEPRAZOLE 40 MG PO CPDR: 40.0000 mg | DELAYED_RELEASE_CAPSULE | Freq: Two times a day (BID) | ORAL | 1 refills | Status: DC

## 2019-09-18 MED ORDER — ALLOPURINOL 100 MG PO TABS: 100.0000 mg | ORAL_TABLET | Freq: Every day | ORAL | 1 refills | Status: AC

## 2019-09-18 MED ORDER — ALBUTEROL SULFATE (2.5 MG/3ML) 0.083% IN NEBU: 2.5000 mg | INHALATION_SOLUTION | Freq: Four times a day (QID) | RESPIRATORY_TRACT | 12 refills | Status: AC | PRN

## 2019-09-18 MED ORDER — ATORVASTATIN CALCIUM 40 MG PO TABS: 40.0000 mg | ORAL_TABLET | Freq: Every day | ORAL | 1 refills | Status: DC

## 2019-09-18 MED ORDER — OXYBUTYNIN CHLORIDE ER 10 MG PO TB24: 10.0000 mg | ORAL_TABLET | Freq: Every day | ORAL | 1 refills | Status: DC

## 2019-09-18 NOTE — Progress Notes (Signed)
[facility]    Established Patient Office Visit  Subjective:  Patient ID: Tabitha Scott, female    DOB: 05/09/1966  Age: 53 y.o. MRN: [id number]  CC:  Chief Complaint  Patient presents with  . Follow-up    HTN,COPD  . Referral    Order for  smaller O2 tanks for transport  . Referral    [facility] in [place]    HPI Tabitha Scott is a 53 year old female who presents for Follow Up today.   Past Medical History:  Diagnosis Date  . Arthritis   . Carpal tunnel syndrome   . Chronic knee pain   . Chronic left shoulder pain   . Chronic lower back pain   . COPD (chronic obstructive pulmonary disease) (HCC)    pt states she does not have COPD  . Depression   . GERD (gastroesophageal reflux disease)   . Gout   . High cholesterol   . History of sleep apnea 06/2019  . Hyperlipidemia   . Hypertension    no longer on medications - stopped taking 4 years ago  . Hypothyroidism   . Low iron   . Neuropathy   . On home oxygen therapy    "have it available; quit smoking; not using it" (04/16/2016)  . OSA (obstructive sleep apnea)    not using cpap  . Pneumonia    03/10/18 - "yeas ago"  . Prediabetes 02/2019  . Thyroid disease   . Type II diabetes mellitus (HCC)    Pt was told she did not have diabetes, was treated at one time   Current Status: Since her last office visit, she is doing well with no complaints. She reports a recent fall, where she did not have her Rollator near. She states that home oxygen at 2 liters is not effective. She has began C-PAP machine. She states that she now sleeps throughout the night.  She denies visual changes, chest pain, and heart palpitations. She has occasional headaches and dizziness with position changes. Denies severe headaches, confusion, seizures, double vision, and blurred vision, nausea and vomiting. She denies fevers, chills, fatigue, recent infections, weight loss, and  night sweats. No reports of GI problems such as nausea, vomiting, diarrhea, and constipation. She has no reports of blood in stools, dysuria and hematuria. No depression or anxiety reported today. She denies suicidal ideations, homicidal  ideations, or auditory hallucinations. She has chronic generalized pain.   Past Surgical History:  Procedure Laterality Date  . CARPAL TUNNEL RELEASE Bilateral 2009  . CHOLECYSTECTOMY    . COLONOSCOPY    . DILATION AND CURETTAGE OF UTERUS    . JOINT REPLACEMENT Left 2015   thumb  . KNEE SURGERY Left 1970s   tissue removed  . LACRIMAL DUCT EXPLORATION Right 12/09/2017   Procedure: DACROCYSTORHINOSTOMY WITH ANTERIOR ETHMOIDECTOMY AND PUNCTOPLASTY, PROBING AND STENT PLACEMENT RIGHT EYE;  Surgeon: Abugo, Usiwoma Ene, MD;  Location: [facility] OR;  Service: Ophthalmology;  Laterality: Right;  . LAPAROSCOPIC CHOLECYSTECTOMY  ~ 2014  . NEUROPLASTY / TRANSPOSITION ULNAR NERVE AT ELBOW Bilateral 2009   nerver repair to both arms   . TUBAL LIGATION    . WRIST ARTHROSCOPY WITH CARPOMETACARPEL (CMC) ARTHROPLASTY Right 03/11/2018   Procedure: RIGHT THUMB CARPOMETACARPEL ([facility]) ARTHROPLASTY WITH ABDUCTOR POLLICIS LONGUS TRANSFER, RIGHT WRIST STENOSING TENOSYNOVITIS RELEASE;  Surgeon: Weingold, Matthew, MD;  Location: [facility] OR;  Service: Orthopedics;  Laterality: Right;    Family History    Problem Relation Age of Onset  . COPD Mother   . Heart disease Father   . Cancer Maternal Grandmother     Social History   Socioeconomic History  . Marital status: Divorced    Spouse name: Not on file  . Number of children: Not on file  . Years of education: Not on file  . Highest education level: Not on file  Occupational History  . Not on file  Tobacco Use  . Smoking status: Former Smoker    Packs/day: 1.50    Years: 41.00    Pack years: 61.50    Types: Cigarettes    Quit date: 02/11/2015    Years since quitting: 4.6  . Smokeless tobacco: Never Used  Substance and Sexual  Activity  . Alcohol use: No    Alcohol/week: 0.0 standard drinks  . Drug use: No  . Sexual activity: Not Currently  Other Topics Concern  . Not on file  Social History Narrative  . Not on file   Social Determinants of Health   Financial Resource Strain:   . Difficulty of Paying Living Expenses: Not on file  Food Insecurity:   . Worried About Charity fundraiser in the Last Year: Not on file  . Ran Out of Food in the Last Year: Not on file  Transportation Needs:   . Lack of Transportation (Medical): Not on file  . Lack of Transportation (Non-Medical): Not on file  Physical Activity:   . Days of Exercise per Week: Not on file  . Minutes of Exercise per Session: Not on file  Stress:   . Feeling of Stress : Not on file  Social Connections:   . Frequency of Communication with Friends and Family: Not on file  . Frequency of Social Gatherings with Friends and Family: Not on file  . Attends Religious Services: Not on file  . Active Member of Clubs or Organizations: Not on file  . Attends Archivist Meetings: Not on file  . Marital Status: Not on file  Intimate Partner Violence:   . Fear of Current or Ex-Partner: Not on file  . Emotionally Abused: Not on file  . Physically Abused: Not on file  . Sexually Abused: Not on file    Outpatient Medications Prior to Visit  Medication Sig Dispense Refill  . furosemide (LASIX) 20 MG tablet TAKE 1 TABLET BY MOUTH DAILY 90 tablet 3  . gabapentin (NEURONTIN) 300 MG capsule TAKE 1 CAPSULE BY MOUTH FOUR TIMES DAILY 240 capsule 3  . Respiratory Therapy Supplies (NEBULIZER COMPRESSOR) KIT 1 Units by Does not apply route daily as needed. 1 each 0  . Vitamin D, Ergocalciferol, (DRISDOL) 1.25 MG (50000 UT) CAPS capsule Take 1 capsule (50,000 Units total) by mouth every 7 (seven) days. 5 capsule 3  . albuterol (PROVENTIL HFA;VENTOLIN HFA) 108 (90 Base) MCG/ACT inhaler Inhale 1-2 puffs into the lungs every 6 (six) hours as needed for wheezing  or shortness of breath. 1 Inhaler 11  . albuterol (PROVENTIL) (2.5 MG/3ML) 0.083% nebulizer solution Take 3 mLs (2.5 mg total) by nebulization every 6 (six) hours as needed for wheezing or shortness of breath. 150 mL 12  . allopurinol (ZYLOPRIM) 100 MG tablet TAKE 1 TABLET BY MOUTH DAILY 90 tablet 0  . atorvastatin (LIPITOR) 40 MG tablet Take 1 tablet (40 mg total) by mouth daily. 30 tablet 3  . levothyroxine (SYNTHROID) 25 MCG tablet TAKE 1 TABLET BY MOUTH DAILY BEFORE BREAKFAST 30 tablet 3  .  metoprolol succinate (TOPROL-XL) 25 MG 24 hr tablet TAKE 1 TABLET BY MOUTH EVERY DAY 30 tablet 3  . omeprazole (PRILOSEC) 40 MG capsule TAKE 1 CAPSULE BY MOUTH TWICE DAILY 180 capsule 0  . oxybutynin (DITROPAN-XL) 10 MG 24 hr tablet TAKE 1 TABLET BY MOUTH EVERY NIGHT AT BEDTIME 30 tablet 3  . colchicine 0.6 MG tablet Take 1 tablet (0.6 mg total) by mouth 2 (two) times daily. 30 tablet 3  . HYDROcodone-acetaminophen (NORCO/VICODIN) 5-325 MG tablet Take 1-2 tablets by mouth every 6 (six) hours as needed. 15 tablet 0  . spironolactone-hydrochlorothiazide (ALDACTAZIDE) 25-25 MG tablet TAKE 1 TABLET BY MOUTH EVERY DAY 30 tablet 3   No facility-administered medications prior to visit.    Allergies  Allergen Reactions  . Other Swelling  . Tetanus Toxoids Swelling  . Tetanus Toxoid, Adsorbed Other (See Comments)    Reaction unknown Reaction unknown    ROS Review of Systems  Constitutional: Negative.   HENT: Negative.   Eyes: Negative.   Respiratory: Positive for shortness of breath (frequent).   Cardiovascular: Negative.   Gastrointestinal: Positive for abdominal distention.  Endocrine: Negative.   Genitourinary: Negative.   Musculoskeletal: Positive for arthralgias (generalized joint pain).  Skin: Negative.   Allergic/Immunologic: Negative.   Neurological: Positive for dizziness (occasional ).  Hematological: Negative.   Psychiatric/Behavioral: Negative.    Objective:    Physical Exam    Constitutional: She is oriented to person, place, and time. She appears well-developed and well-nourished.  HENT:  Head: Normocephalic and atraumatic.  Eyes: Conjunctivae are normal.  Cardiovascular: Normal rate, regular rhythm, normal heart sounds and intact distal pulses.  Pulmonary/Chest: Effort normal and breath sounds normal.  Abdominal: Soft. Bowel sounds are normal. She exhibits distension (occasional ).  Musculoskeletal:        General: Normal range of motion.     Cervical back: Normal range of motion and neck supple.  Neurological: She is alert and oriented to person, place, and time. She has normal reflexes.  Skin: Skin is warm and dry.  Psychiatric: She has a normal mood and affect. Her behavior is normal. Judgment and thought content normal.    BP (!) 147/61   Pulse 86   Temp 98 F (36.7 C)   Ht 5' 4" (1.626 m)   Wt (!) 359 lb 3.2 oz (162.9 kg)   SpO2 95% Comment: on 2 liters of O2  BMI 61.66 kg/m  Wt Readings from Last 3 Encounters:  09/18/19 (!) 359 lb 3.2 oz (162.9 kg)  07/18/19 (!) 345 lb (156.5 kg)  05/22/19 (!) 344 lb 6.4 oz (156.2 kg)     Health Maintenance Due  Topic Date Due  . OPHTHALMOLOGY EXAM  02/02/1974  . COLONOSCOPY  02/02/2014  . FOOT EXAM  06/15/2018  . MAMMOGRAM  09/04/2018  . URINE MICROALBUMIN  05/04/2019    There are no preventive care reminders to display for this patient.  Lab Results  Component Value Date   TSH WILL FOLLOW 09/18/2019   Lab Results  Component Value Date   WBC 9.3 09/18/2019   HGB 10.1 (L) 09/18/2019   HCT 34.1 09/18/2019   MCV 78 (L) 09/18/2019   PLT 265 09/18/2019   Lab Results  Component Value Date   NA 142 08/30/2018   K 4.3 08/30/2018   CO2 29 08/30/2018   GLUCOSE 84 08/30/2018   BUN 13 08/30/2018   CREATININE 1.06 (H) 08/30/2018   BILITOT 0.7 08/30/2018   ALKPHOS 111 08/30/2018     AST 20 08/30/2018   ALT 17 08/30/2018   PROT 7.1 08/30/2018   ALBUMIN 4.0 08/30/2018   CALCIUM 8.9 08/30/2018    ANIONGAP 14 03/11/2018   GFR 53.02 (L) 12/14/2016   Lab Results  Component Value Date   CHOL WILL FOLLOW 09/18/2019   Lab Results  Component Value Date   HDL WILL FOLLOW 09/18/2019   Lab Results  Component Value Date   Adrian WILL FOLLOW 09/18/2019   Lab Results  Component Value Date   TRIG WILL FOLLOW 09/18/2019   Lab Results  Component Value Date   CHOLHDL WILL FOLLOW 09/18/2019   Lab Results  Component Value Date   HGBA1C 6.3 (A) 09/18/2019      Assessment & Plan:   1. Prediabetes - POCT glycosylated hemoglobin (Hb A1C) - POCT urinalysis dipstick - POCT glucose (manual entry)  2. Essential hypertension The current medical regimen is effective; blood pressure is stable at 147/61 today; continue present plan and medications as prescribed. She will continue to take medications as prescribed, to decrease high sodium intake, excessive alcohol intake, increase potassium intake, smoking cessation, and increase physical activity of at least 30 minutes of cardio activity daily. She will continue to follow Heart Healthy or DASH diet. - CBC with Differential - Comprehensive metabolic panel; Future - Lipid Panel - TSH - Vitamin B12 - Vitamin D, 25-hydroxy - metoprolol succinate (TOPROL-XL) 25 MG 24 hr tablet; Take 1 tablet (25 mg total) by mouth daily.  Dispense: 90 tablet; Refill: 1  3. History of sleep apnea - albuterol (VENTOLIN HFA) 108 (90 Base) MCG/ACT inhaler; Inhale 1-2 puffs into the lungs every 6 (six) hours as needed for wheezing or shortness of breath.  Dispense: 8 g; Refill: 12  4. Class 3 severe obesity due to excess calories with serious comorbidity and body mass index (BMI) of 60.0 to 69.9 in adult [facility]) Body mass index is 61.66 kg/m. Goal BMI  is <30. Encouraged efforts to reduce weight include engaging in physical activity as tolerated with goal of 150 minutes per week. Improve dietary choices and eat a meal regimen consistent with a Mediterranean or DASH  diet. Reduce simple carbohydrates. Do not skip meals and eat healthy snacks throughout the day to avoid over-eating at dinner. Set a goal weight loss that is achievable for you.  5. Shortness of breath - albuterol (VENTOLIN HFA) 108 (90 Base) MCG/ACT inhaler; Inhale 1-2 puffs into the lungs every 6 (six) hours as needed for wheezing or shortness of breath.  Dispense: 8 g; Refill: 12  6. Chronic obstructive pulmonary disease, unspecified COPD type (HCC) - albuterol (PROVENTIL) (2.5 MG/3ML) 0.083% nebulizer solution; Take 3 mLs (2.5 mg total) by nebulization every 6 (six) hours as needed for wheezing or shortness of breath.  Dispense: 150 mL; Refill: 12  7. Cough - albuterol (VENTOLIN HFA) 108 (90 Base) MCG/ACT inhaler; Inhale 1-2 puffs into the lungs every 6 (six) hours as needed for wheezing or shortness of breath.  Dispense: 8 g; Refill: 12 - albuterol (PROVENTIL) (2.5 MG/3ML) 0.083% nebulizer solution; Take 3 mLs (2.5 mg total) by nebulization every 6 (six) hours as needed for wheezing or shortness of breath.  Dispense: 150 mL; Refill: 12  8. Chest congestion - albuterol (PROVENTIL) (2.5 MG/3ML) 0.083% nebulizer solution; Take 3 mLs (2.5 mg total) by nebulization every 6 (six) hours as needed for wheezing or shortness of breath.  Dispense: 150 mL; Refill: 12  9. Gout involving toe, unspecified cause, unspecified chronicity, unspecified laterality -  allopurinol (ZYLOPRIM) 100 MG tablet; Take 1 tablet (100 mg total) by mouth daily.  Dispense: 90 tablet; Refill: 1  10. Hyperlipidemia LDL goal <100 - atorvastatin (LIPITOR) 40 MG tablet; Take 1 tablet (40 mg total) by mouth daily.  Dispense: 90 tablet; Refill: 1  11. Gastroesophageal reflux disease without esophagitis - omeprazole (PRILOSEC) 40 MG capsule; Take 1 capsule (40 mg total) by mouth 2 (two) times daily.  Dispense: 180 capsule; Refill: 1  12. Urinary frequency - oxybutynin (DITROPAN-XL) 10 MG 24 hr tablet; Take 1 tablet (10 mg total)  by mouth at bedtime.  Dispense: 90 tablet; Refill: 1  13. Acquired hypothyroidism - levothyroxine (SYNTHROID) 25 MCG tablet; TAKE 1 TABLET BY MOUTH DAILY BEFORE BREAKFAST  Dispense: 90 tablet; Refill: 1  14. Lower extremity edema Stable today.   15. Follow up She will follow up in 6 months.   Meds ordered this encounter  Medications  . albuterol (VENTOLIN HFA) 108 (90 Base) MCG/ACT inhaler    Sig: Inhale 1-2 puffs into the lungs every 6 (six) hours as needed for wheezing or shortness of breath.    Dispense:  8 g    Refill:  12  . albuterol (PROVENTIL) (2.5 MG/3ML) 0.083% nebulizer solution    Sig: Take 3 mLs (2.5 mg total) by nebulization every 6 (six) hours as needed for wheezing or shortness of breath.    Dispense:  150 mL    Refill:  12  . allopurinol (ZYLOPRIM) 100 MG tablet    Sig: Take 1 tablet (100 mg total) by mouth daily.    Dispense:  90 tablet    Refill:  1    **Patient requests 90 days supply**  . atorvastatin (LIPITOR) 40 MG tablet    Sig: Take 1 tablet (40 mg total) by mouth daily.    Dispense:  90 tablet    Refill:  1  . levothyroxine (SYNTHROID) 25 MCG tablet    Sig: TAKE 1 TABLET BY MOUTH DAILY BEFORE BREAKFAST    Dispense:  90 tablet    Refill:  1  . metoprolol succinate (TOPROL-XL) 25 MG 24 hr tablet    Sig: Take 1 tablet (25 mg total) by mouth daily.    Dispense:  90 tablet    Refill:  1  . omeprazole (PRILOSEC) 40 MG capsule    Sig: Take 1 capsule (40 mg total) by mouth 2 (two) times daily.    Dispense:  180 capsule    Refill:  1    **Patient requests 90 days supply**  . oxybutynin (DITROPAN-XL) 10 MG 24 hr tablet    Sig: Take 1 tablet (10 mg total) by mouth at bedtime.    Dispense:  90 tablet    Refill:  1   Orders Placed This Encounter  Procedures  . CBC with Differential  . Comprehensive metabolic panel  . Lipid Panel  . TSH  . Vitamin B12  . Vitamin D, 25-hydroxy  . POCT glycosylated hemoglobin (Hb A1C)  . POCT urinalysis dipstick  .  POCT glucose (manual entry)   Referral Orders  No referral(s) requested today   Natalie Stroud,  MSN, FNP-BC [facility] [address]  [place], [place] [address] [phone] fax   Problem List Items Addressed This Visit      Cardiovascular and Mediastinum   Essential hypertension   Relevant Medications   atorvastatin (LIPITOR) 40 MG tablet   metoprolol succinate (TOPROL-XL) 25 MG   24 hr tablet   Other Relevant Orders   CBC with Differential (Completed)   Comprehensive metabolic panel   Lipid Panel (Completed)   TSH (Completed)   Vitamin B12 (Completed)   Vitamin D, 25-hydroxy (Completed)     Digestive   Esophageal reflux   Relevant Medications   omeprazole (PRILOSEC) 40 MG capsule     Endocrine   Hypothyroidism (Chronic)   Relevant Medications   levothyroxine (SYNTHROID) 25 MCG tablet   metoprolol succinate (TOPROL-XL) 25 MG 24 hr tablet     Other   Cough   Relevant Medications   albuterol (VENTOLIN HFA) 108 (90 Base) MCG/ACT inhaler   albuterol (PROVENTIL) (2.5 MG/3ML) 0.083% nebulizer solution   Prediabetes - Primary   Relevant Orders   POCT glycosylated hemoglobin (Hb A1C) (Completed)   POCT urinalysis dipstick (Completed)   POCT glucose (manual entry) (Completed)    Other Visit Diagnoses    History of sleep apnea       Relevant Medications   albuterol (VENTOLIN HFA) 108 (90 Base) MCG/ACT inhaler   Class 3 severe obesity due to excess calories with serious comorbidity and body mass index (BMI) of 60.0 to 69.9 in adult (HCC)       Shortness of breath       Relevant Medications   albuterol (VENTOLIN HFA) 108 (90 Base) MCG/ACT inhaler   Chronic obstructive pulmonary disease, unspecified COPD type (HCC)       Relevant Medications   albuterol (VENTOLIN HFA) 108 (90 Base) MCG/ACT inhaler   albuterol (PROVENTIL) (2.5 MG/3ML) 0.083% nebulizer solution   Chest congestion        Relevant Medications   albuterol (PROVENTIL) (2.5 MG/3ML) 0.083% nebulizer solution   Gout involving toe, unspecified cause, unspecified chronicity, unspecified laterality       Relevant Medications   allopurinol (ZYLOPRIM) 100 MG tablet   Hyperlipidemia LDL goal <100       Relevant Medications   atorvastatin (LIPITOR) 40 MG tablet   metoprolol succinate (TOPROL-XL) 25 MG 24 hr tablet   Urinary frequency       Relevant Medications   oxybutynin (DITROPAN-XL) 10 MG 24 hr tablet   Lower extremity edema       Follow up          Meds ordered this encounter  Medications  . albuterol (VENTOLIN HFA) 108 (90 Base) MCG/ACT inhaler    Sig: Inhale 1-2 puffs into the lungs every 6 (six) hours as needed for wheezing or shortness of breath.    Dispense:  8 g    Refill:  12  . albuterol (PROVENTIL) (2.5 MG/3ML) 0.083% nebulizer solution    Sig: Take 3 mLs (2.5 mg total) by nebulization every 6 (six) hours as needed for wheezing or shortness of breath.    Dispense:  150 mL    Refill:  12  . allopurinol (ZYLOPRIM) 100 MG tablet    Sig: Take 1 tablet (100 mg total) by mouth daily.    Dispense:  90 tablet    Refill:  1    **Patient requests 90 days supply**  . atorvastatin (LIPITOR) 40 MG tablet    Sig: Take 1 tablet (40 mg total) by mouth daily.    Dispense:  90 tablet    Refill:  1  . levothyroxine (SYNTHROID) 25 MCG tablet    Sig: TAKE 1 TABLET BY MOUTH DAILY BEFORE BREAKFAST    Dispense:  90 tablet    Refill:  1  . metoprolol succinate (TOPROL-XL)   25 MG 24 hr tablet    Sig: Take 1 tablet (25 mg total) by mouth daily.    Dispense:  90 tablet    Refill:  1  . omeprazole (PRILOSEC) 40 MG capsule    Sig: Take 1 capsule (40 mg total) by mouth 2 (two) times daily.    Dispense:  180 capsule    Refill:  1    **Patient requests 90 days supply**  . oxybutynin (DITROPAN-XL) 10 MG 24 hr tablet    Sig: Take 1 tablet (10 mg total) by mouth at bedtime.    Dispense:  90 tablet    Refill:  1     Follow-up: No follow-ups on file.    Natalie M Stroud, FNP 

## 2019-09-22 LAB — CBC WITH DIFFERENTIAL/PLATELET
Basophils Absolute: 0.1 10*3/uL (ref 0.0–0.2)
Basos: 1 %
EOS (ABSOLUTE): 0.2 10*3/uL (ref 0.0–0.4)
Eos: 2 %
Hematocrit: 34.1 % (ref 34.0–46.6)
Hemoglobin: 10.1 g/dL — ABNORMAL LOW (ref 11.1–15.9)
Immature Grans (Abs): 0.1 10*3/uL (ref 0.0–0.1)
Immature Granulocytes: 1 %
Lymphocytes Absolute: 1.6 10*3/uL (ref 0.7–3.1)
Lymphs: 17 %
MCH: 23.2 pg — ABNORMAL LOW (ref 26.6–33.0)
MCHC: 29.6 g/dL — ABNORMAL LOW (ref 31.5–35.7)
MCV: 78 fL — ABNORMAL LOW (ref 79–97)
Monocytes Absolute: 0.5 10*3/uL (ref 0.1–0.9)
Monocytes: 6 %
Neutrophils Absolute: 6.9 10*3/uL (ref 1.4–7.0)
Neutrophils: 73 %
Platelets: 265 10*3/uL (ref 150–450)
RBC: 4.35 x10E6/uL (ref 3.77–5.28)
RDW: 17 % — ABNORMAL HIGH (ref 11.7–15.4)
WBC: 9.3 10*3/uL (ref 3.4–10.8)

## 2019-09-22 LAB — LIPID PANEL
Chol/HDL Ratio: 5.4 ratio — ABNORMAL HIGH (ref 0.0–4.4)
Cholesterol, Total: 209 mg/dL — ABNORMAL HIGH (ref 100–199)
HDL: 39 mg/dL — ABNORMAL LOW (ref >39)
LDL Chol Calc (NIH): 127 mg/dL — ABNORMAL HIGH (ref 0–99)
Triglycerides: 245 mg/dL — ABNORMAL HIGH (ref 0–149)
VLDL Cholesterol Cal: 43 mg/dL — ABNORMAL HIGH (ref 5–40)

## 2019-09-22 LAB — VITAMIN B12: Vitamin B-12: 488 pg/mL (ref 232–1245)

## 2019-09-22 LAB — TSH: TSH: 4.59 u[IU]/mL — ABNORMAL HIGH (ref 0.450–4.500)

## 2019-09-22 LAB — VITAMIN D 25 HYDROXY (VIT D DEFICIENCY, FRACTURES): Vit D, 25-Hydroxy: 33.6 ng/mL (ref 30.0–100.0)

## 2019-10-02 ENCOUNTER — Telehealth: Payer: Self-pay | Admitting: Family Medicine

## 2019-10-02 NOTE — Telephone Encounter (Signed)
Pt returning call regarding test results

## 2019-10-22 ENCOUNTER — Other Ambulatory Visit: Payer: Self-pay | Admitting: Family Medicine

## 2019-10-22 DIAGNOSIS — E559 Vitamin D deficiency, unspecified: Secondary | ICD-10-CM

## 2020-02-05 ENCOUNTER — Other Ambulatory Visit: Payer: Self-pay | Admitting: Family Medicine

## 2020-02-05 DIAGNOSIS — I10 Essential (primary) hypertension: Secondary | ICD-10-CM

## 2020-02-05 DIAGNOSIS — R350 Frequency of micturition: Secondary | ICD-10-CM

## 2020-02-12 ENCOUNTER — Other Ambulatory Visit: Payer: Self-pay | Admitting: Family Medicine

## 2020-02-12 DIAGNOSIS — E785 Hyperlipidemia, unspecified: Secondary | ICD-10-CM

## 2020-02-19 ENCOUNTER — Other Ambulatory Visit: Payer: Self-pay | Admitting: Family Medicine

## 2020-02-19 DIAGNOSIS — K219 Gastro-esophageal reflux disease without esophagitis: Secondary | ICD-10-CM

## 2020-02-19 MED ORDER — OMEPRAZOLE 40 MG PO CPDR
40.0000 mg | DELAYED_RELEASE_CAPSULE | Freq: Two times a day (BID) | ORAL | 1 refills | Status: AC
Start: 2020-02-19 — End: ?

## 2020-03-07 ENCOUNTER — Other Ambulatory Visit: Payer: Self-pay | Admitting: Family Medicine

## 2020-03-07 DIAGNOSIS — E559 Vitamin D deficiency, unspecified: Secondary | ICD-10-CM

## 2020-03-15 ENCOUNTER — Ambulatory Visit: Payer: Medicaid Other | Admitting: Family Medicine

## 2020-04-24 ENCOUNTER — Other Ambulatory Visit: Payer: Self-pay | Admitting: Family Medicine

## 2020-04-28 ENCOUNTER — Other Ambulatory Visit: Payer: Self-pay | Admitting: Family Medicine

## 2020-04-28 DIAGNOSIS — E039 Hypothyroidism, unspecified: Secondary | ICD-10-CM

## 2020-04-30 ENCOUNTER — Other Ambulatory Visit: Payer: Self-pay | Admitting: Family Medicine

## 2020-04-30 DIAGNOSIS — R350 Frequency of micturition: Secondary | ICD-10-CM

## 2020-04-30 DIAGNOSIS — I10 Essential (primary) hypertension: Secondary | ICD-10-CM

## 2020-05-06 ENCOUNTER — Telehealth: Payer: Self-pay | Admitting: Family Medicine

## 2020-05-06 ENCOUNTER — Other Ambulatory Visit: Payer: Self-pay | Admitting: Family Medicine

## 2020-05-06 DIAGNOSIS — E039 Hypothyroidism, unspecified: Secondary | ICD-10-CM

## 2020-05-06 MED ORDER — LEVOTHYROXINE SODIUM 25 MCG PO TABS: ORAL_TABLET | ORAL | 3 refills | Status: AC

## 2020-05-06 NOTE — Telephone Encounter (Signed)
Pt was called, VM was left for Pt to call the office and schedule appointment for 1 2022 for labs

## 2020-05-28 DIAGNOSIS — E785 Hyperlipidemia, unspecified: Secondary | ICD-10-CM | POA: Insufficient documentation

## 2020-05-28 DIAGNOSIS — D509 Iron deficiency anemia, unspecified: Secondary | ICD-10-CM | POA: Insufficient documentation

## 2020-05-28 DIAGNOSIS — N183 Chronic kidney disease, stage 3 unspecified: Secondary | ICD-10-CM | POA: Insufficient documentation

## 2020-05-28 DIAGNOSIS — E1169 Type 2 diabetes mellitus with other specified complication: Secondary | ICD-10-CM | POA: Insufficient documentation

## 2020-05-28 DIAGNOSIS — M109 Gout, unspecified: Secondary | ICD-10-CM | POA: Insufficient documentation

## 2020-05-29 DIAGNOSIS — J439 Emphysema, unspecified: Secondary | ICD-10-CM

## 2020-05-29 DIAGNOSIS — R059 Cough, unspecified: Secondary | ICD-10-CM | POA: Insufficient documentation

## 2020-05-29 DIAGNOSIS — J9601 Acute respiratory failure with hypoxia: Secondary | ICD-10-CM | POA: Insufficient documentation

## 2020-05-29 DIAGNOSIS — M25532 Pain in left wrist: Secondary | ICD-10-CM | POA: Insufficient documentation

## 2020-05-29 DIAGNOSIS — G4733 Obstructive sleep apnea (adult) (pediatric): Secondary | ICD-10-CM | POA: Insufficient documentation

## 2020-05-29 DIAGNOSIS — M79645 Pain in left finger(s): Secondary | ICD-10-CM | POA: Insufficient documentation

## 2020-06-10 ENCOUNTER — Other Ambulatory Visit: Payer: Self-pay | Admitting: Family Medicine

## 2020-06-10 DIAGNOSIS — R600 Localized edema: Secondary | ICD-10-CM

## 2020-06-15 ENCOUNTER — Other Ambulatory Visit: Payer: Self-pay | Admitting: Family Medicine

## 2020-06-15 DIAGNOSIS — E559 Vitamin D deficiency, unspecified: Secondary | ICD-10-CM

## 2020-07-22 ENCOUNTER — Other Ambulatory Visit: Payer: Self-pay | Admitting: Family Medicine

## 2020-07-22 DIAGNOSIS — K219 Gastro-esophageal reflux disease without esophagitis: Secondary | ICD-10-CM

## 2020-07-22 DIAGNOSIS — E559 Vitamin D deficiency, unspecified: Secondary | ICD-10-CM

## 2020-08-01 DIAGNOSIS — M25511 Pain in right shoulder: Secondary | ICD-10-CM | POA: Insufficient documentation

## 2021-10-01 ENCOUNTER — Other Ambulatory Visit: Payer: Self-pay

## 2021-10-01 DIAGNOSIS — M7989 Other specified soft tissue disorders: Secondary | ICD-10-CM

## 2021-10-06 ENCOUNTER — Other Ambulatory Visit: Payer: Self-pay

## 2021-10-06 ENCOUNTER — Ambulatory Visit (HOSPITAL_COMMUNITY)
Admission: RE | Admit: 2021-10-06 | Discharge: 2021-10-06 | Disposition: A | Discharge status: Home / Self Care | Payer: Medicaid Other | Source: Ambulatory Visit | Attending: Vascular Surgery | Admitting: Vascular Surgery

## 2021-10-06 DIAGNOSIS — M7989 Other specified soft tissue disorders: Secondary | ICD-10-CM | POA: Diagnosis present

## 2021-10-08 ENCOUNTER — Encounter: Payer: Medicaid Other | Admitting: Vascular Surgery

## 2021-10-28 ENCOUNTER — Other Ambulatory Visit: Payer: Self-pay

## 2021-10-28 ENCOUNTER — Encounter: Payer: Self-pay | Admitting: Vascular Surgery

## 2021-10-28 ENCOUNTER — Ambulatory Visit: Payer: Medicaid Other | Admitting: Vascular Surgery

## 2021-10-28 DIAGNOSIS — I872 Venous insufficiency (chronic) (peripheral): Secondary | ICD-10-CM

## 2021-10-28 NOTE — Progress Notes (Signed)
Patient name: Tabitha Scott MRN: [id number] DOB: 06-08-1966 Sex: female  REASON FOR CONSULT: Evaluate lower extremity edema and venous insufficiency  HPI: Laretha Luepke is a 56 y.o. female, with multiple medical problems including COPD on home oxygen, hypertension, hyperlipidemia, morbid obesity, diabetes that presents for evaluation of lower extremity edema and venous insufficiency.  She describes years of lower extremity swelling.  She brought this up to her primary care doctor after she started noticing discoloration to her bilateral shins.  She been put on Lasix and ultimately referred here.  States she cannot wear compression.  She does feel the left leg is much worse than the right.  Specifically she has more swelling and discomfort around the ankle on the left.  No history of DVT.  No previous venous interventions.  Past Medical History:  Diagnosis Date   Arthritis    Carpal tunnel syndrome    Chronic knee pain    Chronic left shoulder pain    Chronic lower back pain    COPD (chronic obstructive pulmonary disease) ([place])    pt states she does not have COPD   Depression    GERD (gastroesophageal reflux disease)    Gout    High cholesterol    History of sleep apnea 06/2019   Hyperlipidemia    Hypertension    no longer on medications - stopped taking 4 years ago   Hypothyroidism    Low iron    Neuropathy    On home oxygen therapy    "have it available; quit smoking; not using it" (04/16/2016)   OSA (obstructive sleep apnea)    not using cpap   Pneumonia    03/10/18 - "yeas ago"   Prediabetes 02/2019   Thyroid disease    Type II diabetes mellitus ([place])    Pt was told she did not have diabetes, was treated at one time    Past Surgical History:  Procedure Laterality Date   CARPAL TUNNEL RELEASE Bilateral 2009   CHOLECYSTECTOMY     COLONOSCOPY     DILATION AND CURETTAGE OF UTERUS     JOINT REPLACEMENT Left 2015   thumb   KNEE SURGERY Left 1970s   tissue  removed   LACRIMAL DUCT EXPLORATION Right 12/09/2017   Procedure: DACROCYSTORHINOSTOMY WITH ANTERIOR ETHMOIDECTOMY AND PUNCTOPLASTY, PROBING AND STENT PLACEMENT RIGHT EYE;  Surgeon: Clista Bernhardt, MD;  Location: [facility];  Service: Ophthalmology;  Laterality: Right;   LAPAROSCOPIC CHOLECYSTECTOMY  ~ 2014   NEUROPLASTY / TRANSPOSITION ULNAR NERVE AT ELBOW Bilateral 2009   nerver repair to both arms    TUBAL LIGATION     WRIST ARTHROSCOPY WITH CARPOMETACARPEL [facility]) ARTHROPLASTY Right 03/11/2018   Procedure: RIGHT THUMB CARPOMETACARPEL ([place]) ARTHROPLASTY WITH ABDUCTOR POLLICIS LONGUS TRANSFER, RIGHT WRIST STENOSING TENOSYNOVITIS RELEASE;  Surgeon: Charlotte Crumb, MD;  Location: [place];  Service: Orthopedics;  Laterality: Right;    Family History  Problem Relation Age of Onset   COPD Mother    Heart disease Father    Cancer Maternal Grandmother     SOCIAL HISTORY: Social History   Socioeconomic History   Marital status: Divorced    Spouse name: Not on file   Number of children: Not on file   Years of education: Not on file   Highest education level: Not on file  Occupational History   Not on file  Tobacco Use   Smoking status: Former    Packs/day: 1.50    Years: 41.00  Pack years: 61.50    Types: Cigarettes    Quit date: 02/11/2015    Years since quitting: 6.7   Smokeless tobacco: Never  Vaping Use   Vaping Use: Never used  Substance and Sexual Activity   Alcohol use: No    Alcohol/week: 0.0 standard drinks   Drug use: No   Sexual activity: Not Currently  Other Topics Concern   Not on file  Social History Narrative   Not on file   Social Determinants of Health   Financial Resource Strain: Not on file  Food Insecurity: Not on file  Transportation Needs: Not on file  Physical Activity: Not on file  Stress: Not on file  Social Connections: Not on file  Intimate Partner Violence: Not on file    Allergies  Allergen Reactions   Other Swelling   Tetanus Toxoids  Swelling   Tetanus Toxoid, Adsorbed Other (See Comments)    Reaction unknown Reaction unknown    Current Outpatient Medications  Medication Sig Dispense Refill   albuterol (PROVENTIL) (2.5 MG/3ML) 0.083% nebulizer solution Take 3 mLs (2.5 mg total) by nebulization every 6 (six) hours as needed for wheezing or shortness of breath. 150 mL 12   albuterol (VENTOLIN HFA) 108 (90 Base) MCG/ACT inhaler Inhale 1-2 puffs into the lungs every 6 (six) hours as needed for wheezing or shortness of breath. 8 g 12   allopurinol (ZYLOPRIM) 100 MG tablet Take 1 tablet (100 mg total) by mouth daily. 90 tablet 1   atorvastatin (LIPITOR) 40 MG tablet TAKE 1 TABLET BY MOUTH EVERY DAY 90 tablet 1   furosemide (LASIX) 20 MG tablet TAKE 1 TABLET BY MOUTH DAILY 90 tablet 3   gabapentin (NEURONTIN) 300 MG capsule TAKE 1 CAPSULE BY MOUTH FOUR TIMES DAILY 240 capsule 3   levothyroxine (SYNTHROID) 25 MCG tablet TAKE 1 TABLET BY MOUTH DAILY BEFORE BREAKFAST 90 tablet 3   metoprolol succinate (TOPROL-XL) 25 MG 24 hr tablet TAKE 1 TABLET BY MOUTH EVERY DAY 90 tablet 3   omeprazole (PRILOSEC) 40 MG capsule Take 1 capsule (40 mg total) by mouth 2 (two) times daily. 180 capsule 1   oxybutynin (DITROPAN-XL) 10 MG 24 hr tablet TAKE 1 TABLET BY MOUTH EVERY NIGHT AT BEDTIME 90 tablet 3   Respiratory Therapy Supplies (NEBULIZER COMPRESSOR) KIT 1 Units by Does not apply route daily as needed. 1 each 0   Vitamin D, Ergocalciferol, (DRISDOL) 1.25 MG (50000 UNIT) CAPS capsule TAKE 1 CAPSULE BY MOUTH EVERY 7 DAYS 5 capsule 0   No current facility-administered medications for this visit.    REVIEW OF SYSTEMS:  [X]  denotes positive finding, [ ]  denotes negative finding Cardiac  Comments:  Chest pain or chest pressure:    Shortness of breath upon exertion:    Short of breath when lying flat:    Irregular heart rhythm:        Vascular    Pain in calf, thigh, or hip brought on by ambulation:    Pain in feet at night that wakes you  up from your sleep:     Blood clot in your veins:    Leg swelling:  x       Pulmonary    Oxygen at home:    Productive cough:     Wheezing:         Neurologic    Sudden weakness in arms or legs:     Sudden numbness in arms or legs:     Sudden onset of  difficulty speaking or slurred speech:    Temporary loss of vision in one eye:     Problems with dizziness:         Gastrointestinal    Blood in stool:     Vomited blood:         Genitourinary    Burning when urinating:     Blood in urine:        Psychiatric    Major depression:         Hematologic    Bleeding problems:    Problems with blood clotting too easily:        Skin    Rashes or ulcers:        Constitutional    Fever or chills:      PHYSICAL EXAM: Vitals:   10/28/21 1529  BP: (!) 158/70  Pulse: 83  Resp: 16  Temp: 97.9 F (36.6 C)  TempSrc: Temporal  SpO2: 96%  Weight: (!) 307 lb (139.3 kg)  Height: 5' 3"  (1.6 m)    GENERAL: The patient is a well-nourished female, in no acute distress. The vital signs are documented above. CARDIAC: There is a regular rate and rhythm.  VASCULAR:  Palpable DP pulses bilateral Bilateral lower extremity skin changes medially above the ankle PULMONARY: No respiratory distress. ABDOMEN: Soft and non-tender. MUSCULOSKELETAL: There are no major deformities or cyanosis. NEUROLOGIC: No focal weakness or paresthesias are detected. PSYCHIATRIC: The patient has a normal affect.  DATA:   Lower Venous Reflux Study   Patient Name:  [facility]  Date of Exam:   10/06/2021  Medical Rec #: [id number]            Accession #:    [id number]  Date of Birth: Apr 11, 1966            Patient Gender: F  Patient Age:   77 years  Exam Location:  [facility]  Procedure:      VAS [place] LOWER EXTREMITY VENOUS REFLUX  Referring Phys: Harold Barban    ---------------------------------------------------------------------------  -----     Indications: Swelling, and  Pain.     Performing Technologist: Delorise Shiner RVT      Examination Guidelines: A complete evaluation includes B-mode imaging,  spectral  Doppler, color Doppler, and power Doppler as needed of all accessible  portions  of each vessel. Bilateral testing is considered an integral part of a  complete  examination. Limited examinations for reoccurring indications may be  performed  as noted. The reflux portion of the exam is performed with the patient in  reverse Trendelenburg.  Significant venous reflux is defined as >500 ms in the superficial venous  system, and >1 second in the deep venous system.      Venous Reflux Times  +--------------+---------+------+-----------+------------+--------+   RIGHT          Reflux No Reflux Reflux Time Diameter cms Comments                              Yes                                       +--------------+---------+------+-----------+------------+--------+   CFV                       yes    >1 second                          +--------------+---------+------+-----------+------------+--------+  FV prox        no                                                   +--------------+---------+------+-----------+------------+--------+   FV mid         no                                                   +--------------+---------+------+-----------+------------+--------+   FV dist        no                                                   +--------------+---------+------+-----------+------------+--------+   Popliteal      no                                                   +--------------+---------+------+-----------+------------+--------+   GSV at SFJ                yes     >500 ms      0.666                +--------------+---------+------+-----------+------------+--------+   GSV prox thigh no                              0.675                +--------------+---------+------+-----------+------------+--------+   GSV mid thigh  no                               0.681                +--------------+---------+------+-----------+------------+--------+   GSV dist thigh no                              0.851                +--------------+---------+------+-----------+------------+--------+   GSV at knee    no                              0.736                +--------------+---------+------+-----------+------------+--------+   GSV prox calf                                  0.575                +--------------+---------+------+-----------+------------+--------+   GSV mid calf  0.563                +--------------+---------+------+-----------+------------+--------+   SSV Pop Fossa             yes     >500 ms      0.588                +--------------+---------+------+-----------+------------+--------+   SSV prox calf             yes     >500 ms      0.615                +--------------+---------+------+-----------+------------+--------+   SSV mid calf                                   0.513                +--------------+---------+------+-----------+------------+--------+      +--------------+---------+------+-----------+------------+--------+   LEFT           Reflux No Reflux Reflux Time Diameter cms Comments                              Yes                                       +--------------+---------+------+-----------+------------+--------+   CFV                       yes    >1 second                          +--------------+---------+------+-----------+------------+--------+   FV prox        no                                                   +--------------+---------+------+-----------+------------+--------+   FV mid         no                                                   +--------------+---------+------+-----------+------------+--------+   FV dist        no                                                   +--------------+---------+------+-----------+------------+--------+   Popliteal      no                                                    +--------------+---------+------+-----------+------------+--------+   GSV at SFJ                yes     >500 ms  0.971                +--------------+---------+------+-----------+------------+--------+   GSV prox thigh            yes     >500 ms      0.815                +--------------+---------+------+-----------+------------+--------+   GSV mid thigh  no                              0.766                +--------------+---------+------+-----------+------------+--------+   GSV dist thigh            yes     >500 ms      0.748                +--------------+---------+------+-----------+------------+--------+   GSV at knee                                    0.461                +--------------+---------+------+-----------+------------+--------+   GSV prox calf                                  0.365                +--------------+---------+------+-----------+------------+--------+   GSV mid calf                                   0.347                +--------------+---------+------+-----------+------------+--------+   SSV Pop Fossa  no                              0.443                +--------------+---------+------+-----------+------------+--------+   SSV prox calf  no                              0.336                +--------------+---------+------+-----------+------------+--------+   SSV mid calf                                   0.471                +--------------+---------+------+-----------+------------+--------+        Summary:  Right:  - No evidence of deep vein thrombosis seen in the right lower extremity,  from the common femoral through the popliteal veins.  - Venous reflux is noted in the right common femoral vein.  - Venous reflux is noted in the right short saphenous vein.     Left:  - No evidence of deep vein thrombosis seen in the left lower extremity,  from the common femoral through the popliteal veins.  - No  evidence of superficial venous thrombosis in the left lower  extremity.  - Venous reflux is noted in the left common  femoral vein.  - Venous reflux is noted in the left sapheno-femoral junction.  - Venous reflux is noted in the left greater saphenous vein in the thigh.      Assessment/Plan:  56 year old female with multiple medical comorbidities that presents for evaluation of bilateral lower extremity edema and venous insufficiency.  I discussed with her that her venous study today does show evidence of bilateral lower extremity venous insufficiency consistent with CEAP classification C4 given pigmentation and skin changes on exam.  Her left leg is more symptomatic than the right.  She does have fairly long superficial reflux in the left great saphenous vein from the distal thigh up to the saphenofemoral junction and the vein is quite large measuring 7 to 10 mm.  I have initially recommended conservative therapy.  I discussed that the most important thing for her will be weight loss given her BMI of 54.  I have also recommended elevation exercise and compression.  We tried to size her for knee-high compression stockings today.  I will bring her back in 3 months to see one of my partners given she feels the left leg is much worse and her reflux pattern in the left leg may be amendable to laser ablation.  I discussed with her that given her obesity any invasive intervention may be challenging and ultimately she may be best served with continue conservative management until she is able to lose more weight.   Marty Heck, MD [facility] Office: [phone]

## 2022-02-04 ENCOUNTER — Ambulatory Visit: Payer: Medicaid Other | Admitting: Vascular Surgery

## 2022-02-04 ENCOUNTER — Encounter: Payer: Self-pay | Admitting: Vascular Surgery

## 2022-02-04 VITALS — BP 122/81 | HR 72 | Temp 98.6°F | Resp 20 | Ht 63.0 in | Wt 289.0 lb

## 2022-02-04 DIAGNOSIS — I872 Venous insufficiency (chronic) (peripheral): Secondary | ICD-10-CM | POA: Diagnosis not present

## 2022-02-04 DIAGNOSIS — M7989 Other specified soft tissue disorders: Secondary | ICD-10-CM

## 2022-02-04 NOTE — Progress Notes (Signed)
Patient ID: Tabitha Scott, female   DOB: [birth year], 56 y.o.   MRN: [id number]  Reason for Consult: Follow-up   Referred by Simona Huh, NP  Subjective:     HPI:  Tabitha Scott is a 56 y.o. female with left greater than right C4 venous disease.  She has been unable to wear compression stockings.  Since last visit she is actually lost about 30 pounds and is feeling much better and states that her legs had less swelling.  She still walking with the help of a cane but does not need it is much.  She has lost weight by watching her diet and also by in chair workouts 2 hours daily.  She is also had some discoloration of her hands which she notices with warm as well as cold weather does not have any ulceration.  Past Medical History:  Diagnosis Date   Arthritis    Carpal tunnel syndrome    Chronic knee pain    Chronic left shoulder pain    Chronic lower back pain    COPD (chronic obstructive pulmonary disease) ([place])    pt states she does not have COPD   Depression    GERD (gastroesophageal reflux disease)    Gout    High cholesterol    History of sleep apnea 06/2019   Hyperlipidemia    Hypertension    no longer on medications - stopped taking 4 years ago   Hypothyroidism    Low iron    Neuropathy    On home oxygen therapy    "have it available; quit smoking; not using it" (04/16/2016)   OSA (obstructive sleep apnea)    not using cpap   Pneumonia    03/10/18 - "yeas ago"   Prediabetes 02/2019   Thyroid disease    Type II diabetes mellitus ([place])    Pt was told she did not have diabetes, was treated at one time   Family History  Problem Relation Age of Onset   COPD Mother    Heart disease Father    Cancer Maternal Grandmother    Past Surgical History:  Procedure Laterality Date   CARPAL TUNNEL RELEASE Bilateral 2009   CHOLECYSTECTOMY     COLONOSCOPY     DILATION AND CURETTAGE OF UTERUS     JOINT REPLACEMENT Left 2015   thumb   KNEE SURGERY Left 1970s    tissue removed   LACRIMAL DUCT EXPLORATION Right 12/09/2017   Procedure: DACROCYSTORHINOSTOMY WITH ANTERIOR ETHMOIDECTOMY AND PUNCTOPLASTY, PROBING AND STENT PLACEMENT RIGHT EYE;  Surgeon: Clista Bernhardt, MD;  Location: [place];  Service: Ophthalmology;  Laterality: Right;   LAPAROSCOPIC CHOLECYSTECTOMY  ~ 2014   NEUROPLASTY / TRANSPOSITION ULNAR NERVE AT ELBOW Bilateral 2009   nerver repair to both arms    TUBAL LIGATION     WRIST ARTHROSCOPY WITH CARPOMETACARPEL [facility]) ARTHROPLASTY Right 03/11/2018   Procedure: RIGHT THUMB CARPOMETACARPEL ([place]) ARTHROPLASTY WITH ABDUCTOR POLLICIS LONGUS TRANSFER, RIGHT WRIST STENOSING TENOSYNOVITIS RELEASE;  Surgeon: Charlotte Crumb, MD;  Location: [place];  Service: Orthopedics;  Laterality: Right;    Short Social History:  Social History   Tobacco Use   Smoking status: Former    Packs/day: 1.50    Years: 41.00    Total pack years: 61.50    Types: Cigarettes    Quit date: 02/11/2015    Years since quitting: 6.9   Smokeless tobacco: Never  Substance Use Topics   Alcohol use: No    Alcohol/week: 0.0  standard drinks of alcohol    Allergies  Allergen Reactions   Other Swelling   Tetanus Toxoids Swelling   Tetanus Toxoid, Adsorbed Other (See Comments)    Reaction unknown Reaction unknown    Current Outpatient Medications  Medication Sig Dispense Refill   albuterol (PROVENTIL) (2.5 MG/3ML) 0.083% nebulizer solution Take 3 mLs (2.5 mg total) by nebulization every 6 (six) hours as needed for wheezing or shortness of breath. 150 mL 12   albuterol (VENTOLIN HFA) 108 (90 Base) MCG/ACT inhaler Inhale 1-2 puffs into the lungs every 6 (six) hours as needed for wheezing or shortness of breath. 8 g 12   allopurinol (ZYLOPRIM) 100 MG tablet Take 1 tablet (100 mg total) by mouth daily. 90 tablet 1   atorvastatin (LIPITOR) 40 MG tablet TAKE 1 TABLET BY MOUTH EVERY DAY 90 tablet 1   furosemide (LASIX) 20 MG tablet TAKE 1 TABLET BY MOUTH DAILY 90 tablet 3    gabapentin (NEURONTIN) 300 MG capsule TAKE 1 CAPSULE BY MOUTH FOUR TIMES DAILY 240 capsule 3   levothyroxine (SYNTHROID) 25 MCG tablet TAKE 1 TABLET BY MOUTH DAILY BEFORE BREAKFAST 90 tablet 3   metoprolol succinate (TOPROL-XL) 25 MG 24 hr tablet TAKE 1 TABLET BY MOUTH EVERY DAY 90 tablet 3   omeprazole (PRILOSEC) 40 MG capsule Take 1 capsule (40 mg total) by mouth 2 (two) times daily. 180 capsule 1   oxybutynin (DITROPAN-XL) 10 MG 24 hr tablet TAKE 1 TABLET BY MOUTH EVERY NIGHT AT BEDTIME 90 tablet 3   Respiratory Therapy Supplies (NEBULIZER COMPRESSOR) KIT 1 Units by Does not apply route daily as needed. 1 each 0   Vitamin D, Ergocalciferol, (DRISDOL) 1.25 MG (50000 UNIT) CAPS capsule TAKE 1 CAPSULE BY MOUTH EVERY 7 DAYS 5 capsule 0   No current facility-administered medications for this visit.    Review of Systems  Constitutional:  Constitutional negative. HENT: HENT negative.  Eyes: Eyes negative.  Cardiovascular: Positive for leg swelling.  GI: Gastrointestinal negative.  Musculoskeletal: Musculoskeletal negative.  Skin: Skin negative.  Neurological: Neurological negative. Hematologic: Hematologic/lymphatic negative.  Psychiatric: Psychiatric negative.        Objective:  Objective  Vitals:   02/04/22 1545  BP: 122/81  Pulse: 72  Resp: 20  Temp: 98.6 F (37 C)  SpO2: 94%     Physical Exam HENT:     Head: Normocephalic.  Eyes:     Pupils: Pupils are equal, round, and reactive to light.  Cardiovascular:     Rate and Rhythm: Normal rate.     Pulses:          Dorsalis pedis pulses are 2+ on the right side and 2+ on the left side.  Pulmonary:     Effort: Pulmonary effort is normal.  Abdominal:     General: Abdomen is flat.     Palpations: Abdomen is soft.  Musculoskeletal:     Cervical back: Normal range of motion.     Right lower leg: Edema present.     Left lower leg: Edema present.  Skin:    Capillary Refill: Capillary refill takes less than 2 seconds.   Neurological:     General: No focal deficit present.     Mental Status: She is alert.  Psychiatric:        Mood and Affect: Mood normal.        Behavior: Behavior normal.        Thought Content: Thought content normal.     Data: Venous Reflux  Times  +--------------+---------+------+-----------+------------+--------+  RIGHT         Reflux NoRefluxReflux TimeDiameter cmsComments                          Yes                                   +--------------+---------+------+-----------+------------+--------+  CFV                     yes   >1 second                       +--------------+---------+------+-----------+------------+--------+  FV prox       no                                              +--------------+---------+------+-----------+------------+--------+  FV mid        no                                              +--------------+---------+------+-----------+------------+--------+  FV dist       no                                              +--------------+---------+------+-----------+------------+--------+  Popliteal     no                                              +--------------+---------+------+-----------+------------+--------+  GSV at SFJ              yes    >500 ms     0.666              +--------------+---------+------+-----------+------------+--------+  GSV prox thighno                           0.675              +--------------+---------+------+-----------+------------+--------+  GSV mid thigh no                           0.681              +--------------+---------+------+-----------+------------+--------+  GSV dist thighno                           0.851              +--------------+---------+------+-----------+------------+--------+  GSV at knee   no                           0.736              +--------------+---------+------+-----------+------------+--------+  GSV  prox calf  0.575              +--------------+---------+------+-----------+------------+--------+  GSV mid calf                               0.563              +--------------+---------+------+-----------+------------+--------+  SSV Pop Fossa           yes    >500 ms     0.588              +--------------+---------+------+-----------+------------+--------+  SSV prox calf           yes    >500 ms     0.615              +--------------+---------+------+-----------+------------+--------+  SSV mid calf                               0.513              +--------------+---------+------+-----------+------------+--------+      +--------------+---------+------+-----------+------------+--------+  LEFT          Reflux NoRefluxReflux TimeDiameter cmsComments                          Yes                                   +--------------+---------+------+-----------+------------+--------+  CFV                     yes   >1 second                       +--------------+---------+------+-----------+------------+--------+  FV prox       no                                              +--------------+---------+------+-----------+------------+--------+  FV mid        no                                              +--------------+---------+------+-----------+------------+--------+  FV dist       no                                              +--------------+---------+------+-----------+------------+--------+  Popliteal     no                                              +--------------+---------+------+-----------+------------+--------+  GSV at SFJ              yes    >500 ms     0.971              +--------------+---------+------+-----------+------------+--------+  GSV prox thigh  yes    >500 ms     0.815               +--------------+---------+------+-----------+------------+--------+  GSV mid thigh no                           0.766              +--------------+---------+------+-----------+------------+--------+  GSV dist thigh          yes    >500 ms     0.748              +--------------+---------+------+-----------+------------+--------+  GSV at knee                                0.461              +--------------+---------+------+-----------+------------+--------+  GSV prox calf                              0.365              +--------------+---------+------+-----------+------------+--------+  GSV mid calf                               0.347              +--------------+---------+------+-----------+------------+--------+  SSV Pop Fossa no                           0.443              +--------------+---------+------+-----------+------------+--------+  SSV prox calf no                           0.336              +--------------+---------+------+-----------+------------+--------+  SSV mid calf                               0.471              +--------------+---------+------+-----------+------------+--------+           Summary:  Right:  - No evidence of deep vein thrombosis seen in the right lower extremity,  from the common femoral through the popliteal veins.  - Venous reflux is noted in the right common femoral vein.  - Venous reflux is noted in the right short saphenous vein.     Left:  - No evidence of deep vein thrombosis seen in the left lower extremity,  from the common femoral through the popliteal veins.  - No evidence of superficial venous thrombosis in the left lower  extremity.  - Venous reflux is noted in the left common femoral vein.  - Venous reflux is noted in the left sapheno-femoral junction.  - Venous reflux is noted in the left greater saphenous vein in the thigh.         Assessment/Plan:    56 year old  female with C3 venous disease with swelling of her bilateral lower extremities but really no skin changes at this point since she has lost 30 pounds.  I commended her on weight loss.  She is unable to wear compression stockings and as such I recommended continued weight  loss hopefully she will not have any issues.  If she does have persistent issues in her lower extremities or recurrent skin changes we could consider laser ablation particularly the left where she has a very large greater saphenous vein.  She will otherwise follow-up with me on an as-needed basis.     Waynetta Sandy MD [facility]

## 2022-06-04 ENCOUNTER — Other Ambulatory Visit: Payer: Self-pay | Admitting: *Deleted

## 2022-06-04 DIAGNOSIS — I872 Venous insufficiency (chronic) (peripheral): Secondary | ICD-10-CM

## 2022-06-10 NOTE — Progress Notes (Signed)
HISTORY AND PHYSICAL     CC:  follow up Requesting Provider:  Simona Huh, NP  HPI: Tabitha Scott is a 56 y.o. (1966-07-29) female who presents  with c/o numbness and tingling of her fingertips on both hands.  She states that sometimes her fingertips turn colors.  She shows me a picture of them with a bluish tint.   She was seen earlier this year for venous reflux disease.  She did not have any skin changes.  She had lost 30lbs.  She was unable to wear compression stockings.  It was felt that she should continue with her weight loss and f/u as needed if she had any new skin color changes she could be considered for laser ablation of the LLE.   The pt is on a statin for cholesterol management.  The pt is not on a daily aspirin.   Other AC:  none The pt is on BB for hypertension.   The pt is not diabetic.   Tobacco hx:  former  Pt does not have family hx of AAA.  Past Medical History:  Diagnosis Date   Arthritis    Carpal tunnel syndrome    Chronic knee pain    Chronic left shoulder pain    Chronic lower back pain    COPD (chronic obstructive pulmonary disease) ([place])    pt states she does not have COPD   Depression    GERD (gastroesophageal reflux disease)    Gout    High cholesterol    History of sleep apnea 06/2019   Hyperlipidemia    Hypertension    no longer on medications - stopped taking 4 years ago   Hypothyroidism    Low iron    Neuropathy    On home oxygen therapy    "have it available; quit smoking; not using it" (04/16/2016)   OSA (obstructive sleep apnea)    not using cpap   Pneumonia    03/10/18 - "yeas ago"   Prediabetes 02/2019   Thyroid disease    Type II diabetes mellitus ([place])    Pt was told she did not have diabetes, was treated at one time    Past Surgical History:  Procedure Laterality Date   CARPAL TUNNEL RELEASE Bilateral 2009   CHOLECYSTECTOMY     COLONOSCOPY     DILATION AND CURETTAGE OF UTERUS     JOINT REPLACEMENT Left 2015    thumb   KNEE SURGERY Left 1970s   tissue removed   LACRIMAL DUCT EXPLORATION Right 12/09/2017   Procedure: DACROCYSTORHINOSTOMY WITH ANTERIOR ETHMOIDECTOMY AND PUNCTOPLASTY, PROBING AND STENT PLACEMENT RIGHT EYE;  Surgeon: Clista Bernhardt, MD;  Location: [place];  Service: Ophthalmology;  Laterality: Right;   LAPAROSCOPIC CHOLECYSTECTOMY  ~ 2014   NEUROPLASTY / TRANSPOSITION ULNAR NERVE AT ELBOW Bilateral 2009   nerver repair to both arms    TUBAL LIGATION     WRIST ARTHROSCOPY WITH CARPOMETACARPEL [facility]) ARTHROPLASTY Right 03/11/2018   Procedure: RIGHT THUMB CARPOMETACARPEL ([place]) ARTHROPLASTY WITH ABDUCTOR POLLICIS LONGUS TRANSFER, RIGHT WRIST STENOSING TENOSYNOVITIS RELEASE;  Surgeon: Charlotte Crumb, MD;  Location: [place];  Service: Orthopedics;  Laterality: Right;    Social History   Socioeconomic History   Marital status: Divorced    Spouse name: Not on file   Number of children: Not on file   Years of education: Not on file   Highest education level: Not on file  Occupational History   Not on file  Tobacco Use  Smoking status: Former    Packs/day: 1.50    Years: 41.00    Total pack years: 61.50    Types: Cigarettes    Quit date: 02/11/2015    Years since quitting: 7.3   Smokeless tobacco: Never  Vaping Use   Vaping Use: Never used  Substance and Sexual Activity   Alcohol use: No    Alcohol/week: 0.0 standard drinks of alcohol   Drug use: No   Sexual activity: Not Currently  Other Topics Concern   Not on file  Social History Narrative   Not on file   Social Determinants of Health   Financial Resource Strain: Not on file  Food Insecurity: Not on file  Transportation Needs: Not on file  Physical Activity: Not on file  Stress: Not on file  Social Connections: Not on file  Intimate Partner Violence: Not on file     Family History  Problem Relation Age of Onset   COPD Mother    Heart disease Father    Cancer Maternal Grandmother     Current Outpatient  Medications  Medication Sig Dispense Refill   albuterol (PROVENTIL) (2.5 MG/3ML) 0.083% nebulizer solution Take 3 mLs (2.5 mg total) by nebulization every 6 (six) hours as needed for wheezing or shortness of breath. 150 mL 12   albuterol (VENTOLIN HFA) 108 (90 Base) MCG/ACT inhaler Inhale 1-2 puffs into the lungs every 6 (six) hours as needed for wheezing or shortness of breath. 8 g 12   allopurinol (ZYLOPRIM) 100 MG tablet Take 1 tablet (100 mg total) by mouth daily. 90 tablet 1   atorvastatin (LIPITOR) 40 MG tablet TAKE 1 TABLET BY MOUTH EVERY DAY 90 tablet 1   furosemide (LASIX) 20 MG tablet TAKE 1 TABLET BY MOUTH DAILY 90 tablet 3   gabapentin (NEURONTIN) 300 MG capsule TAKE 1 CAPSULE BY MOUTH FOUR TIMES DAILY 240 capsule 3   levothyroxine (SYNTHROID) 25 MCG tablet TAKE 1 TABLET BY MOUTH DAILY BEFORE BREAKFAST 90 tablet 3   metoprolol succinate (TOPROL-XL) 25 MG 24 hr tablet TAKE 1 TABLET BY MOUTH EVERY DAY 90 tablet 3   omeprazole (PRILOSEC) 40 MG capsule Take 1 capsule (40 mg total) by mouth 2 (two) times daily. 180 capsule 1   oxybutynin (DITROPAN-XL) 10 MG 24 hr tablet TAKE 1 TABLET BY MOUTH EVERY NIGHT AT BEDTIME 90 tablet 3   Respiratory Therapy Supplies (NEBULIZER COMPRESSOR) KIT 1 Units by Does not apply route daily as needed. 1 each 0   Vitamin D, Ergocalciferol, (DRISDOL) 1.25 MG (50000 UNIT) CAPS capsule TAKE 1 CAPSULE BY MOUTH EVERY 7 DAYS 5 capsule 0   No current facility-administered medications for this visit.    Allergies  Allergen Reactions   Other Swelling   Tetanus Toxoids Swelling   Tetanus Toxoid, Adsorbed Other (See Comments)    Reaction unknown Reaction unknown     REVIEW OF SYSTEMS:   _0  denotes positive finding, _1  denotes negative finding Cardiac  Comments:  Chest pain or chest pressure:    Shortness of breath upon exertion:    Short of breath when lying flat:    Irregular heart rhythm:        Vascular    Pain in calf, thigh, or hip brought on by  ambulation:    Pain in feet at night that wakes you up from your sleep:     Blood clot in your veins:    Leg swelling:  x       Pulmonary  Oxygen at home: x   Productive cough:     Wheezing:         Neurologic    Sudden weakness in arms or legs:     Sudden numbness in arms or legs:     Sudden onset of difficulty speaking or slurred speech:    Temporary loss of vision in one eye:     Problems with dizziness:         Gastrointestinal    Blood in stool:     Vomited blood:         Genitourinary    Burning when urinating:     Blood in urine:        Psychiatric    Major depression:         Hematologic    Bleeding problems:    Problems with blood clotting too easily:        Skin    Rashes or ulcers:        Constitutional    Fever or chills:      PHYSICAL EXAMINATION:  Today's Vitals   06/12/22 1432  BP: 137/72  Pulse: 74  Resp: 14  Temp: 97.8 F (36.6 C)  TempSrc: Temporal  SpO2: 99%  Weight: 277 lb (125.6 kg)  Height: _0  (1.626 m)  PainSc: 7    Body mass index is 47.55 kg/m.   General:  WDWN in NAD; vital signs documented above Gait: Not observed HENT: WNL, normocephalic Pulmonary: normal non-labored breathing Cardiac: regular HR, without carotid bruits Abdomen: obese Skin: without rashes Vascular Exam/Pulses:  Right Left  Radial 2+ (normal) 2+ (normal)  DP 2+ (normal) 2+ (normal)   Extremities: without open wounds;  Musculoskeletal: no muscle wasting or atrophy  Neurologic: A&O X 3 Psychiatric:  The pt has Normal affect.   Non-Invasive Vascular Imaging on 06/11/2022:   Summary:     Right: No significant arterial obstruction detected in the right  upper extremity.  Left: No significant arterial obstruction detected in the left upper extremity    ASSESSMENT/PLAN:: 56 y.o. female here for evaluation of BUE arterial flow   -pt with easily palpable radial pulses bilaterally and her study today reveals no significant arterial  obstruction in BUE with tracings with appropriate pulsatility.   Most likely with neuropathy in her hands.  If persistent, may benefit from evaluation of spine physician.   -she has lost about 15lbs since her last visit with [place].  She is doing great and I encouraged her to continue with her weight loss journey.   -she will f/u as needed.     Leontine Locket, [facility] [phone]  Clinic MD:   Virl Cagey

## 2022-06-11 ENCOUNTER — Ambulatory Visit (HOSPITAL_COMMUNITY)
Admission: RE | Admit: 2022-06-11 | Discharge: 2022-06-11 | Disposition: A | Discharge status: Home / Self Care | Payer: Medicaid Other | Source: Ambulatory Visit | Attending: Vascular Surgery | Admitting: Vascular Surgery

## 2022-06-11 DIAGNOSIS — I872 Venous insufficiency (chronic) (peripheral): Secondary | ICD-10-CM | POA: Diagnosis present

## 2022-06-12 ENCOUNTER — Encounter: Payer: Self-pay | Admitting: Physician Assistant

## 2022-06-12 ENCOUNTER — Ambulatory Visit: Payer: Medicaid Other | Admitting: Physician Assistant

## 2022-06-12 VITALS — BP 137/72 | HR 74 | Temp 97.8°F | Resp 14 | Ht 64.0 in | Wt 277.0 lb

## 2022-06-12 DIAGNOSIS — R200 Anesthesia of skin: Secondary | ICD-10-CM | POA: Diagnosis not present

## 2022-11-02 ENCOUNTER — Emergency Department (HOSPITAL_BASED_OUTPATIENT_CLINIC_OR_DEPARTMENT_OTHER): Payer: Medicaid Other

## 2022-11-02 ENCOUNTER — Emergency Department (HOSPITAL_BASED_OUTPATIENT_CLINIC_OR_DEPARTMENT_OTHER)
Admission: EM | Admit: 2022-11-02 | Discharge: 2022-11-02 | Disposition: A | Discharge status: Home / Self Care | Payer: Medicaid Other | Attending: Emergency Medicine | Admitting: Emergency Medicine

## 2022-11-02 ENCOUNTER — Encounter (HOSPITAL_BASED_OUTPATIENT_CLINIC_OR_DEPARTMENT_OTHER): Payer: Self-pay | Admitting: Emergency Medicine

## 2022-11-02 ENCOUNTER — Other Ambulatory Visit (HOSPITAL_BASED_OUTPATIENT_CLINIC_OR_DEPARTMENT_OTHER): Payer: Self-pay

## 2022-11-02 ENCOUNTER — Other Ambulatory Visit: Payer: Self-pay

## 2022-11-02 DIAGNOSIS — Z20822 Contact with and (suspected) exposure to covid-19: Secondary | ICD-10-CM | POA: Diagnosis not present

## 2022-11-02 DIAGNOSIS — E878 Other disorders of electrolyte and fluid balance, not elsewhere classified: Secondary | ICD-10-CM | POA: Diagnosis not present

## 2022-11-02 DIAGNOSIS — H1031 Unspecified acute conjunctivitis, right eye: Secondary | ICD-10-CM | POA: Insufficient documentation

## 2022-11-02 DIAGNOSIS — J441 Chronic obstructive pulmonary disease with (acute) exacerbation: Secondary | ICD-10-CM | POA: Diagnosis not present

## 2022-11-02 DIAGNOSIS — Z7951 Long term (current) use of inhaled steroids: Secondary | ICD-10-CM | POA: Diagnosis not present

## 2022-11-02 DIAGNOSIS — Z794 Long term (current) use of insulin: Secondary | ICD-10-CM | POA: Diagnosis not present

## 2022-11-02 DIAGNOSIS — B9689 Other specified bacterial agents as the cause of diseases classified elsewhere: Secondary | ICD-10-CM | POA: Diagnosis not present

## 2022-11-02 DIAGNOSIS — J168 Pneumonia due to other specified infectious organisms: Secondary | ICD-10-CM | POA: Insufficient documentation

## 2022-11-02 DIAGNOSIS — Z9981 Dependence on supplemental oxygen: Secondary | ICD-10-CM | POA: Diagnosis not present

## 2022-11-02 DIAGNOSIS — J439 Emphysema, unspecified: Secondary | ICD-10-CM | POA: Diagnosis not present

## 2022-11-02 DIAGNOSIS — R059 Cough, unspecified: Secondary | ICD-10-CM | POA: Diagnosis present

## 2022-11-02 DIAGNOSIS — J189 Pneumonia, unspecified organism: Secondary | ICD-10-CM

## 2022-11-02 DIAGNOSIS — E119 Type 2 diabetes mellitus without complications: Secondary | ICD-10-CM | POA: Diagnosis not present

## 2022-11-02 DIAGNOSIS — I5030 Unspecified diastolic (congestive) heart failure: Secondary | ICD-10-CM | POA: Insufficient documentation

## 2022-11-02 DIAGNOSIS — D72829 Elevated white blood cell count, unspecified: Secondary | ICD-10-CM | POA: Insufficient documentation

## 2022-11-02 DIAGNOSIS — E876 Hypokalemia: Secondary | ICD-10-CM | POA: Diagnosis not present

## 2022-11-02 LAB — CBC WITH DIFFERENTIAL/PLATELET
Abs Immature Granulocytes: 0.24 10*3/uL — ABNORMAL HIGH (ref 0.00–0.07)
Basophils Absolute: 0.1 10*3/uL (ref 0.0–0.1)
Basophils Relative: 0 %
Eosinophils Absolute: 0.1 10*3/uL (ref 0.0–0.5)
Eosinophils Relative: 1 %
HCT: 38.1 % (ref 36.0–46.0)
Hemoglobin: 12.7 g/dL (ref 12.0–15.0)
Immature Granulocytes: 2 %
Lymphocytes Relative: 13 %
Lymphs Abs: 1.7 10*3/uL (ref 0.7–4.0)
MCH: 29.6 pg (ref 26.0–34.0)
MCHC: 33.3 g/dL (ref 30.0–36.0)
MCV: 88.8 fL (ref 80.0–100.0)
Monocytes Absolute: 1 10*3/uL (ref 0.1–1.0)
Monocytes Relative: 7 %
Neutro Abs: 10.5 10*3/uL — ABNORMAL HIGH (ref 1.7–7.7)
Neutrophils Relative %: 77 %
Platelets: 195 10*3/uL (ref 150–400)
RBC: 4.29 MIL/uL (ref 3.87–5.11)
RDW: 14.5 % (ref 11.5–15.5)
WBC: 13.7 10*3/uL — ABNORMAL HIGH (ref 4.0–10.5)
nRBC: 0 % (ref 0.0–0.2)

## 2022-11-02 LAB — RESP PANEL BY RT-PCR (RSV, FLU A&B, COVID)  RVPGX2
Influenza A by PCR: NEGATIVE
Influenza B by PCR: NEGATIVE
Resp Syncytial Virus by PCR: NEGATIVE
SARS Coronavirus 2 by RT PCR: NEGATIVE

## 2022-11-02 LAB — BASIC METABOLIC PANEL
Anion gap: 9 (ref 5–15)
BUN: 11 mg/dL (ref 6–20)
CO2: 35 mmol/L — ABNORMAL HIGH (ref 22–32)
Calcium: 9.3 mg/dL (ref 8.9–10.3)
Chloride: 91 mmol/L — ABNORMAL LOW (ref 98–111)
Creatinine, Ser: 1.18 mg/dL — ABNORMAL HIGH (ref 0.44–1.00)
GFR, Estimated: 54 mL/min — ABNORMAL LOW (ref >60)
Glucose, Bld: 103 mg/dL — ABNORMAL HIGH (ref 70–99)
Potassium: 3.1 mmol/L — ABNORMAL LOW (ref 3.5–5.1)
Sodium: 135 mmol/L (ref 135–145)

## 2022-11-02 LAB — BRAIN NATRIURETIC PEPTIDE: B Natriuretic Peptide: 87.5 pg/mL (ref 0.0–100.0)

## 2022-11-02 MED ORDER — FLUORESCEIN SODIUM 1 MG OP STRP
1.0000 | ORAL_STRIP | Freq: Once | OPHTHALMIC | Status: AC
  Administered 2022-11-02: 1 via OPHTHALMIC
  Filled 2022-11-02: qty 1

## 2022-11-02 MED ORDER — AMOXICILLIN 500 MG PO CAPS
1000.0000 mg | ORAL_CAPSULE | Freq: Three times a day (TID) | ORAL | Status: DC
  Filled 2022-11-02: qty 2

## 2022-11-02 MED ORDER — METHYLPREDNISOLONE SODIUM SUCC 125 MG IJ SOLR
125.0000 mg | Freq: Once | INTRAMUSCULAR | Status: AC
  Administered 2022-11-02: 125 mg via INTRAVENOUS
  Filled 2022-11-02: qty 2

## 2022-11-02 MED ORDER — AMOXICILLIN 500 MG PO CAPS: 1000.0000 mg | ORAL_CAPSULE | Freq: Three times a day (TID) | ORAL | 0 refills | Status: AC

## 2022-11-02 MED ORDER — AZITHROMYCIN 250 MG PO TABS
250.0000 mg | ORAL_TABLET | Freq: Every day | ORAL | 0 refills | Status: AC
Start: 2022-11-02 — End: ?

## 2022-11-02 MED ORDER — POLYMYXIN B-TRIMETHOPRIM 10000-0.1 UNIT/ML-% OP SOLN
1.0000 [drp] | OPHTHALMIC | Status: DC
  Administered 2022-11-02: 1 [drp] via OPHTHALMIC
  Filled 2022-11-02: qty 10

## 2022-11-02 MED ORDER — AZITHROMYCIN 250 MG PO TABS
500.0000 mg | ORAL_TABLET | Freq: Once | ORAL | Status: AC
  Administered 2022-11-02: 500 mg via ORAL
  Filled 2022-11-02: qty 2

## 2022-11-02 MED ORDER — ALBUTEROL SULFATE (2.5 MG/3ML) 0.083% IN NEBU
5.0000 mg | INHALATION_SOLUTION | Freq: Once | RESPIRATORY_TRACT | Status: AC
  Administered 2022-11-02: 5 mg via RESPIRATORY_TRACT
  Filled 2022-11-02: qty 6

## 2022-11-02 MED ORDER — IPRATROPIUM BROMIDE 0.02 % IN SOLN
0.5000 mg | Freq: Once | RESPIRATORY_TRACT | Status: AC
  Administered 2022-11-02: 0.5 mg via RESPIRATORY_TRACT
  Filled 2022-11-02: qty 2.5

## 2022-11-02 MED ORDER — POTASSIUM CHLORIDE CRYS ER 20 MEQ PO TBCR
40.0000 meq | EXTENDED_RELEASE_TABLET | Freq: Once | ORAL | Status: AC
  Administered 2022-11-02: 40 meq via ORAL
  Filled 2022-11-02: qty 2

## 2022-11-02 MED ORDER — ALBUTEROL SULFATE (2.5 MG/3ML) 0.083% IN NEBU
INHALATION_SOLUTION | RESPIRATORY_TRACT | Status: AC
  Filled 2022-11-02: qty 3

## 2022-11-02 MED ORDER — PREDNISONE 20 MG PO TABS: 60.0000 mg | ORAL_TABLET | Freq: Every day | ORAL | 0 refills | Status: AC

## 2022-11-02 MED ORDER — TETRACAINE HCL 0.5 % OP SOLN
2.0000 [drp] | Freq: Once | OPHTHALMIC | Status: AC
  Administered 2022-11-02: 2 [drp] via OPHTHALMIC
  Filled 2022-11-02: qty 4

## 2022-11-02 NOTE — Discharge Instructions (Signed)
Please read and follow all provided instructions.  Your diagnoses today include:  1. Pneumonia of left lower lobe due to infectious organism   2. COPD exacerbation ([place])   3. Acute bacterial conjunctivitis of right eye     Tests performed today include: Blood counts and electrolytes: Your white blood cell count was elevated today and your potassium level was a bit low Flu, COVID, RSV testing: Was negative Blood test for heart failure: Was normal Chest x-ray -- suggests pneumonia Vital signs. See below for your results today.   Medications prescribed:  Amoxicillin - antibiotic  You have been prescribed an antibiotic medicine: take the entire course of medicine even if you are feeling better. Stopping early can cause the antibiotic not to work.  Azithromycin - antibiotic for respiratory infection  You have been prescribed an antibiotic medicine: take the entire course of medicine even if you are feeling better. Stopping early can cause the antibiotic not to work.  Prednisone - steroid medicine   It is best to take this medication in the morning to prevent sleeping problems. If you are diabetic, monitor your blood sugar closely and stop taking Prednisone if blood sugar is over 300. Take with food to prevent stomach upset.   Polytrim (polymyxin B/trimethoprim) - antibiotic eye drops  Use this medication as follows: Use 1 drop in affected eye every 4 hours while awake for 10 days. Do not exceed 6 doses in 24 hours.  Take any prescribed medications only as directed.  Home care instructions:  Follow any educational materials contained in this packet.  Take the complete course of antibiotics that you were prescribed.   BE VERY CAREFUL not to take multiple medicines containing Tylenol (also called acetaminophen). Doing so can lead to an overdose which can damage your liver and cause liver failure and possibly death.   Follow-up instructions: Please follow-up with your primary care  provider in the next 3 days for further evaluation of your symptoms and to ensure resolution of your infection.   Return instructions:  Please return to the Emergency Department if you experience worsening symptoms.  Return immediately with worsening breathing, worsening shortness of breath, or if you feel it is taking you more effort to breathe.  Please return if you have any other emergent concerns.  Additional Information:  Your vital signs today were: BP 134/63 (BP Location: Right Arm)   Pulse 91   Temp 99 F (37.2 C) (Oral)   Resp (!) 22   Ht '5\' 4"'$  (1.626 m)   Wt 125.6 kg   SpO2 100%   BMI 47.53 kg/m  If your blood pressure (BP) was elevated above 135/85 this visit, please have this repeated by your doctor within one month. --------------  '

## 2022-11-02 NOTE — Procedures (Signed)
RT educated the Pt on her flutter valve. Pt was able to use the flutter independently. RT will continue to monitor

## 2022-11-02 NOTE — ED Notes (Signed)
Pt's pulse ox 94-96% on home 2L ambulating. Ambulatory to RR w cane, no assistance from staff.

## 2022-11-02 NOTE — ED Triage Notes (Signed)
Pt arrives to ED with c/o conjunctivitis, cough, and nasal congestion. She notes congestion and cough x2 weeks and red and painful right eye that started yesterday. Associated with chills. Hx COPD.

## 2022-11-02 NOTE — ED Provider Notes (Signed)
[facility] Provider Note   CSN: [id number] Arrival date & time: 11/02/22  [id number]     History  Chief Complaint  Patient presents with   Nasal Congestion   Conjunctivitis   Cough    Tabitha Scott is a 57 y.o. female.  Patient with history of diabetes, emphysema on 2 L home oxygen, obstructive sleep apnea, diastolic heart failure --presents to the emergency department today for evaluation of cough for the past 2 weeks.  She has an inhaler that she uses at home.  She has become progressively more short of breath.  Cough is productive of sputum.  She states that her symptoms have been a lot worse over the past 3 days.  She typically is active and exercises daily, but recently has just been able to sit in a chair for most of the day.  Her neighbor brought her to the emergency department today.  Patient also reports 2 to 3 days of right eye redness and swelling.  She describes a "sand" sensation in the eye.  Denies trauma or foreign bodies.  She states that her vision seems "cloudy".  She states that she has had a tear duct procedure in that eye in the past.  No other surgeries.       Home Medications Prior to Admission medications   Medication Sig Start Date End Date Taking? Authorizing Provider  albuterol (PROVENTIL) (2.5 MG/3ML) 0.083% nebulizer solution Take 3 mLs (2.5 mg total) by nebulization every 6 (six) hours as needed for wheezing or shortness of breath. 09/18/19   Azzie Glatter, FNP  albuterol (VENTOLIN HFA) 108 (90 Base) MCG/ACT inhaler Inhale 1-2 puffs into the lungs every 6 (six) hours as needed for wheezing or shortness of breath. 09/18/19   Azzie Glatter, FNP  allopurinol (ZYLOPRIM) 100 MG tablet Take 1 tablet (100 mg total) by mouth daily. 09/18/19   Azzie Glatter, FNP  atorvastatin (LIPITOR) 40 MG tablet TAKE 1 TABLET BY MOUTH EVERY DAY 02/13/20   Azzie Glatter, FNP  ezetimibe (ZETIA) 10 MG tablet Take 10 mg by  mouth every evening. 10/22/22   [provider]  furosemide (LASIX) 20 MG tablet TAKE 1 TABLET BY MOUTH DAILY 07/03/19   Azzie Glatter, FNP  gabapentin (NEURONTIN) 300 MG capsule TAKE 1 CAPSULE BY MOUTH FOUR TIMES DAILY 09/10/19   Azzie Glatter, FNP  ipratropium-albuterol (DUONEB) 0.5-2.5 (3) MG/3ML SOLN Inhale 1 mL into the lungs every 4 (four) hours as needed. 10/22/22   [provider]  levothyroxine (SYNTHROID) 25 MCG tablet TAKE 1 TABLET BY MOUTH DAILY BEFORE BREAKFAST 05/06/20   Azzie Glatter, FNP  levothyroxine (SYNTHROID) 75 MCG tablet Take 75 mcg by mouth daily. 10/24/22   [provider]  metoprolol succinate (TOPROL-XL) 25 MG 24 hr tablet TAKE 1 TABLET BY MOUTH EVERY DAY 05/02/20   Azzie Glatter, FNP  omeprazole (PRILOSEC) 40 MG capsule Take 1 capsule (40 mg total) by mouth 2 (two) times daily. 02/19/20   Azzie Glatter, FNP  oxybutynin (DITROPAN-XL) 10 MG 24 hr tablet TAKE 1 TABLET BY MOUTH EVERY NIGHT AT BEDTIME 05/02/20   Azzie Glatter, FNP  OZEMPIC, 2 MG/DOSE, 8 MG/3ML SOPN Inject 2 mg into the skin once a week. 10/26/22   [provider]  potassium chloride (KLOR-CON) 10 MEQ tablet Take 10 mEq by mouth 2 (two) times daily. 10/22/22   [provider]  Respiratory Therapy Supplies (NEBULIZER COMPRESSOR) KIT  1 Units by Does not apply route daily as needed. 08/30/18   Azzie Glatter, FNP  Vitamin D, Ergocalciferol, (DRISDOL) 1.25 MG (50000 UNIT) CAPS capsule TAKE 1 CAPSULE BY MOUTH EVERY 7 DAYS 06/19/20   Azzie Glatter, FNP      Allergies    Other; Tetanus toxoids; and Tetanus toxoid, adsorbed    Review of Systems   Review of Systems  Physical Exam Updated Vital Signs BP 134/63 (BP Location: Right Arm)   Pulse 92   Temp 99 F (37.2 C) (Oral)   Resp 20   Ht '5\' 4"'$  (1.626 m)   Wt 125.6 kg   SpO2 97%   BMI 47.53 kg/m   Physical Exam Vitals and nursing note reviewed.  Constitutional:      General: She is not in  acute distress.    Appearance: She is well-developed.  HENT:     Head: Normocephalic and atraumatic.     Right Ear: External ear normal.     Left Ear: External ear normal.     Nose: Nose normal.     Mouth/Throat:     Mouth: Mucous membranes are moist.  Eyes:     General: Lids are normal.     Intraocular pressure: Right eye pressure is 8 mmHg. Measurements were taken using a handheld tonometer.    Conjunctiva/sclera:     Right eye: Chemosis and exudate present.     Left eye: No chemosis or exudate.    Pupils:     Right eye: Pupil is round, reactive and not sluggish. No corneal abrasion or fluorescein uptake.     Left eye: Pupil is round, reactive and not sluggish. No corneal abrasion or fluorescein uptake.     Comments: Conjunctivitis of the right eye with chemosis, pupil appears normal and is reactive, cornea appears clear.  Cardiovascular:     Rate and Rhythm: Normal rate and regular rhythm.     Heart sounds: No murmur heard. Pulmonary:     Effort: No respiratory distress.     Breath sounds: Wheezing and rhonchi present. No rales.     Comments: Patient with rhonchi and wheezing throughout all lung fields, frequent coughing during exam Abdominal:     Palpations: Abdomen is soft.     Tenderness: There is no abdominal tenderness. There is no guarding or rebound.  Musculoskeletal:     Cervical back: Normal range of motion and neck supple.     Right lower leg: No edema.     Left lower leg: No edema.  Skin:    General: Skin is warm and dry.     Findings: No rash.  Neurological:     General: No focal deficit present.     Mental Status: She is alert. Mental status is at baseline.     Motor: No weakness.  Psychiatric:        Mood and Affect: Mood normal.     ED Results / Procedures / Treatments   Labs (all labs ordered are listed, but only abnormal results are displayed) Labs Reviewed  CBC WITH DIFFERENTIAL/PLATELET - Abnormal; Notable for the following components:       Result Value   WBC 13.7 (*)    Neutro Abs 10.5 (*)    Abs Immature Granulocytes 0.24 (*)    All other components within normal limits  BASIC METABOLIC PANEL - Abnormal; Notable for the following components:   Potassium 3.1 (*)    Chloride 91 (*)    CO2 35 (*)  Glucose, Bld 103 (*)    Creatinine, Ser 1.18 (*)    GFR, Estimated 54 (*)    All other components within normal limits  RESP PANEL BY RT-PCR (RSV, FLU A&B, COVID)  RVPGX2  BRAIN NATRIURETIC PEPTIDE    EKG EKG Interpretation  Date/Time:  Monday November 02 2022 10:43:05 EDT Ventricular Rate:  87 PR Interval:  168 QRS Duration: 101 QT Interval:  379 QTC Calculation: 456 R Axis:   24 Text Interpretation: Sinus rhythm Low voltage, precordial leads No significant change since last tracing Confirmed by Regan Lemming ([id number]) on 11/02/2022 12:00:03 PM  Radiology DG Chest Port 1 View  Result Date: 11/02/2022 CLINICAL DATA:  Cough EXAM: PORTABLE CHEST 1 VIEW COMPARISON:  CXR 04/15/16 FINDINGS: No pleural effusion. No pneumothorax. Hazy left basilar opacity is suspicious for infection. Normal cardiac and mediastinal contours. No radiographically apparent displaced rib fractures. Visualized upper abdomen is unremarkable. Degenerative changes of the bilateral AC joints. IMPRESSION: Hazy left basilar opacity is suspicious for infection. Electronically Signed   By: Marin Roberts M.D.   On: 11/02/2022 10:24    Procedures Procedures    Medications Ordered in ED Medications  methylPREDNISolone sodium succinate (SOLU-MEDROL) 125 mg/2 mL injection 125 mg (has no administration in time range)  albuterol (PROVENTIL) (2.5 MG/3ML) 0.083% nebulizer solution 5 mg (has no administration in time range)  ipratropium (ATROVENT) nebulizer solution 0.5 mg (has no administration in time range)  fluorescein ophthalmic strip 1 strip (has no administration in time range)  tetracaine (PONTOCAINE) 0.5 % ophthalmic solution 2 drop (has no administration in  time range)    ED Course/ Medical Decision Making/ A&P    Patient seen and examined. History obtained directly from patient.   Labs/EKG: Ordered CBC, BMP, BNP, EKG.  Imaging: Ordered chest x-ray.  Medications/Fluids: Ordered: Albuterol, Atrovent, tetracaine.   Most recent vital signs reviewed and are as follows: BP 134/63 (BP Location: Right Arm)   Pulse 92   Temp 99 F (37.2 C) (Oral)   Resp 20   Ht '5\' 4"'$  (1.626 m)   Wt 125.6 kg   SpO2 97%   BMI 47.53 kg/m   Initial impression: COPD exacerbation, cough, shortness of breath, right bacterial conjunctivitis  12:32 PM Reassessment performed. Patient appears stable, still coughing, no extreme respiratory distress.  I have seen the patient ambulate in the hallway.  She reports some dizziness but was able to do so independently.  Oxygen saturation did not drop with ambulation on her home oxygen.   Eye exam performed at bedside.  Patient had improvement in the discomfort of the right eye with application of tetracaine.  Labs personally reviewed and interpreted including: CBC with elevated white blood cells at 13.7; BMP with mild hypokalemia 3.1, bicarb elevated at 35 likely due to an element of respiratory acidosis, creatinine 1.18; BNP was normal; respiratory panel negative.  Imaging personally visualized and interpreted including: Chest x-ray agree hazy left basilar opacity.  Reviewed pertinent lab work and imaging with patient at bedside. Questions answered.   Most current vital signs reviewed and are as follows: BP 134/63 (BP Location: Right Arm)   Pulse 91   Temp 99 F (37.2 C) (Oral)   Resp (!) 22   Ht '5\' 4"'$  (1.626 m)   Wt 125.6 kg   SpO2 100%   BMI 47.53 kg/m   Plan: Discharge to home.   Prescriptions written for: Amoxicillin, azithromycin first dose given in the emergency department, Polytrim eyedrops, prednisone for COPD exacerbation.  Other home care instructions discussed: Continued use of home oxygen,  continued use of albuterol inhaler  ED return instructions discussed: Return with worsening shortness of breath, trouble breathing, increased work of breathing, persistent fevers, new symptoms or other concerns.  Follow-up instructions discussed: Patient encouraged to follow-up with their PCP in 2-3 days.                              Medical Decision Making Amount and/or Complexity of Data Reviewed Labs: ordered. Radiology: ordered.  Risk Prescription drug management.   Patient presents with ongoing cough and shortness of breath.  She appears to have a COPD exacerbation with increased sputum production and wheezing.  Also suspected left basilar opacity on x-ray.  Given elevated white blood cell count and respiratory symptoms, will treat for pneumonia.  Low concern for sepsis at this time.  Patient is afebrile.   I had shared decision-making with patient and friend at bedside regarding admission versus home.  We discussed risks benefits of each.  Patient prefers to go home today.  I think that this is reasonable.  She is willing to trial the antibiotics, prednisone, breathing medication.  She states that if her symptoms do worsen she is willing to return to the hospital for further evaluation and possible admission.  Strongly encourage close outpatient follow-up.  Low concern for PE, ACS given workup today.  Patient with right eye consistent with bacterial conjunctivitis.  No foreign bodies noted. No surrounding erythema, swelling, vision changes/loss suspicious for orbital or periorbital cellulitis. No signs of iritis. No signs of glaucoma, intraocular pressures normal. No symptoms of retinal detachment. No ophthalmologic emergency suspected.          Final Clinical Impression(s) / ED Diagnoses Final diagnoses:  Pneumonia of left lower lobe due to infectious organism  COPD exacerbation ([place])  Acute bacterial conjunctivitis of right eye    Rx / DC Orders ED Discharge Orders           Ordered    amoxicillin (AMOXIL) 500 MG capsule  3 times daily        11/02/22 1228    azithromycin (ZITHROMAX) 250 MG tablet  Daily        11/02/22 1228    predniSONE (DELTASONE) 20 MG tablet  Daily        11/02/22 1228              Carlisle Cater, PA-C 11/02/22 1243    Regan Lemming, MD 11/02/22 1545

## 2023-04-15 ENCOUNTER — Encounter (HOSPITAL_COMMUNITY): Payer: Self-pay | Admitting: *Deleted

## 2023-04-15 ENCOUNTER — Ambulatory Visit (HOSPITAL_COMMUNITY)
Admission: EM | Admit: 2023-04-15 | Discharge: 2023-04-15 | Disposition: A | Discharge status: Home / Self Care | Payer: Medicaid Other | Attending: Physician Assistant | Admitting: Physician Assistant

## 2023-04-15 DIAGNOSIS — H60501 Unspecified acute noninfective otitis externa, right ear: Secondary | ICD-10-CM

## 2023-04-15 MED ORDER — NEOMYCIN-POLYMYXIN-HC 3.5-10000-1 OT SOLN: 4.0000 [drp] | Freq: Four times a day (QID) | OTIC | 0 refills | Status: AC

## 2023-04-15 MED ORDER — AMOXICILLIN 500 MG PO CAPS: 500.0000 mg | ORAL_CAPSULE | Freq: Three times a day (TID) | ORAL | 0 refills | Status: AC

## 2023-04-15 NOTE — ED Provider Notes (Signed)
[facility]    CSN: [id number] Arrival date & time: 04/15/23  1916      History   Chief Complaint Chief Complaint  Patient presents with   Otalgia    HPI Tabitha Scott is a 57 y.o. female.   Patient complains of pain in her right ear.  Patient does report recent swimming  The history is provided by the patient. No language interpreter was used.  Otalgia Location:  Right Behind ear:  Swelling Quality:  Aching Duration:  1 week Timing:  Constant Progression:  Worsening Chronicity:  New Relieved by:  Nothing Worsened by:  Nothing Ineffective treatments:  None tried Associated symptoms: no fever     Past Medical History:  Diagnosis Date   Arthritis    Carpal tunnel syndrome    Chronic knee pain    Chronic left shoulder pain    Chronic lower back pain    COPD (chronic obstructive pulmonary disease) (HCC)    pt states she does not have COPD   Depression    GERD (gastroesophageal reflux disease)    Gout    High cholesterol    History of sleep apnea 06/2019   Hyperlipidemia    Hypertension    no longer on medications - stopped taking 4 years ago   Hypothyroidism    Low iron    Neuropathy    On home oxygen therapy    "have it available; quit smoking; not using it" (04/16/2016)   OSA (obstructive sleep apnea)    not using cpap   Pneumonia    03/10/18 - "yeas ago"   Prediabetes 02/2019   Thyroid disease    Type II diabetes mellitus (HCC)    Pt was told she did not have diabetes, was treated at one time    Patient Active Problem List   Diagnosis Date Noted   Chronic venous insufficiency 10/28/2021   Class 3 severe obesity due to excess calories with serious comorbidity and body mass index (BMI) of 50.0 to 59.9 in adult (HCC) 03/19/2019   Prediabetes 03/19/2019   Bilateral primary osteoarthritis of knee 07/08/2017   Essential hypertension 01/28/2017   Dyspnea on exertion 12/14/2016   Diastolic dysfunction 12/14/2016   COPD GOLD 0   09/14/2016   Esophageal reflux    Pulmonary emphysema (HCC)    Pain in the chest 04/15/2016   Anemia 04/15/2016   Hypokalemia 10/18/2015   ARF (acute renal failure) (HCC) 10/18/2015   Cough    Acute respiratory failure with hypoxia (HCC)    OSA on CPAP    Morbid (severe) obesity due to excess calories (HCC)    Diabetes mellitus type 2, noninsulin dependent (HCC)    Hypothyroidism 04/09/2015   Pedal edema 04/09/2015   DDD (degenerative disc disease), lumbar 04/09/2015   Pure hypercholesterolemia 04/09/2015    Past Surgical History:  Procedure Laterality Date   CARPAL TUNNEL RELEASE Bilateral 2009   CHOLECYSTECTOMY     COLONOSCOPY     DILATION AND CURETTAGE OF UTERUS     JOINT REPLACEMENT Left 2015   thumb   KNEE SURGERY Left 1970s   tissue removed   LACRIMAL DUCT EXPLORATION Right 12/09/2017   Procedure: DACROCYSTORHINOSTOMY WITH ANTERIOR ETHMOIDECTOMY AND PUNCTOPLASTY, PROBING AND STENT PLACEMENT RIGHT EYE;  Surgeon: Floydene Flock, MD;  Location: [facility] OR;  Service: Ophthalmology;  Laterality: Right;   LAPAROSCOPIC CHOLECYSTECTOMY  ~ 2014   NEUROPLASTY / TRANSPOSITION ULNAR NERVE AT ELBOW Bilateral 2009   nerver repair to both  arms    TUBAL LIGATION     WRIST ARTHROSCOPY WITH CARPOMETACARPEL [facility]) ARTHROPLASTY Right 03/11/2018   Procedure: RIGHT THUMB CARPOMETACARPEL ([facility]) ARTHROPLASTY WITH ABDUCTOR POLLICIS LONGUS TRANSFER, RIGHT WRIST STENOSING TENOSYNOVITIS RELEASE;  Surgeon: Dairl Ponder, MD;  Location: [facility] OR;  Service: Orthopedics;  Laterality: Right;    OB History     Gravida  6   Para      Term      Preterm      AB      Living  4      SAB      IAB      Ectopic      Multiple      Live Births               Home Medications    Prior to Admission medications   Medication Sig Start Date End Date Taking? Authorizing Provider  albuterol (PROVENTIL) (2.5 MG/3ML) 0.083% nebulizer solution Take 3 mLs (2.5 mg total) by nebulization every 6  (six) hours as needed for wheezing or shortness of breath. 09/18/19  Yes Kallie Locks, FNP  albuterol (VENTOLIN HFA) 108 (90 Base) MCG/ACT inhaler Inhale 1-2 puffs into the lungs every 6 (six) hours as needed for wheezing or shortness of breath. 09/18/19  Yes Kallie Locks, FNP  allopurinol (ZYLOPRIM) 100 MG tablet Take 1 tablet (100 mg total) by mouth daily. 09/18/19  Yes Kallie Locks, FNP  amoxicillin (AMOXIL) 500 MG capsule Take 1 capsule (500 mg total) by mouth 3 (three) times daily. 04/15/23  Yes Cheron Schaumann K, PA-C  atorvastatin (LIPITOR) 40 MG tablet TAKE 1 TABLET BY MOUTH EVERY DAY 02/13/20  Yes Kallie Locks, FNP  azithromycin (ZITHROMAX) 250 MG tablet Take 1 tablet (250 mg total) by mouth daily. 11/02/22  Yes Renne Crigler, PA-C  ezetimibe (ZETIA) 10 MG tablet Take 10 mg by mouth every evening. 10/22/22  Yes [provider]  furosemide (LASIX) 20 MG tablet TAKE 1 TABLET BY MOUTH DAILY 07/03/19  Yes Kallie Locks, FNP  gabapentin (NEURONTIN) 300 MG capsule TAKE 1 CAPSULE BY MOUTH FOUR TIMES DAILY 09/10/19  Yes Kallie Locks, FNP  ipratropium-albuterol (DUONEB) 0.5-2.5 (3) MG/3ML SOLN Inhale 1 mL into the lungs every 4 (four) hours as needed. 10/22/22  Yes [provider]  levothyroxine (SYNTHROID) 25 MCG tablet TAKE 1 TABLET BY MOUTH DAILY BEFORE BREAKFAST 05/06/20  Yes Kallie Locks, FNP  levothyroxine (SYNTHROID) 75 MCG tablet Take 75 mcg by mouth daily. 10/24/22  Yes [provider]  metoprolol succinate (TOPROL-XL) 25 MG 24 hr tablet TAKE 1 TABLET BY MOUTH EVERY DAY 05/02/20  Yes Kallie Locks, FNP  neomycin-polymyxin-hydrocortisone (CORTISPORIN) OTIC solution Place 4 drops into the right ear 4 (four) times daily. 04/15/23  Yes Cheron Schaumann K, PA-C  omeprazole (PRILOSEC) 40 MG capsule Take 1 capsule (40 mg total) by mouth 2 (two) times daily. 02/19/20  Yes Kallie Locks, FNP  oxybutynin (DITROPAN-XL) 10 MG 24 hr tablet TAKE 1 TABLET BY  MOUTH EVERY NIGHT AT BEDTIME 05/02/20  Yes Kallie Locks, FNP  OZEMPIC, 2 MG/DOSE, 8 MG/3ML SOPN Inject 2 mg into the skin once a week. 10/26/22  Yes [provider]  potassium chloride (KLOR-CON) 10 MEQ tablet Take 10 mEq by mouth 2 (two) times daily. 10/22/22  Yes [provider]  Vitamin D, Ergocalciferol, (DRISDOL) 1.25 MG (50000 UNIT) CAPS capsule TAKE 1 CAPSULE BY MOUTH EVERY 7 DAYS 06/19/20  Yes  Kallie Locks, FNP  Respiratory Therapy Supplies (NEBULIZER COMPRESSOR) KIT 1 Units by Does not apply route daily as needed. 08/30/18   Kallie Locks, FNP    Family History Family History  Problem Relation Age of Onset   COPD Mother    Heart disease Father    Cancer Maternal Grandmother     Social History Social History   Tobacco Use   Smoking status: Former    Current packs/day: 0.00    Average packs/day: 1.5 packs/day for 41.0 years (61.5 ttl pk-yrs)    Types: Cigarettes    Start date: 02/10/1974    Quit date: 02/11/2015    Years since quitting: 8.1   Smokeless tobacco: Never  Vaping Use   Vaping status: Never Used  Substance Use Topics   Alcohol use: No    Alcohol/week: 0.0 standard drinks of alcohol   Drug use: No     Allergies   Other; Tetanus toxoids; and Tetanus toxoid, adsorbed   Review of Systems Review of Systems  Constitutional:  Negative for fever.  HENT:  Positive for ear pain.   All other systems reviewed and are negative.    Physical Exam Triage Vital Signs ED Triage Vitals  Encounter Vitals Group     BP 04/15/23 1949 126/73     Systolic BP Percentile --      Diastolic BP Percentile --      Pulse Rate 04/15/23 1949 69     Resp 04/15/23 1949 20     Temp 04/15/23 1949 97.6 F (36.4 C)     Temp Source 04/15/23 1949 Oral     SpO2 04/15/23 1949 98 %     Weight --      Height --      Head Circumference --      Peak Flow --      Pain Score 04/15/23 1946 8     Pain Loc --      Pain Education --      Exclude from Growth  Chart --    No data found.  Updated Vital Signs BP 126/73 (BP Location: Right Arm)   Pulse 69   Temp 97.6 F (36.4 C) (Oral)   Resp 20   SpO2 98%   Visual Acuity Right Eye Distance:   Left Eye Distance:   Bilateral Distance:    Right Eye Near:   Left Eye Near:    Bilateral Near:     Physical Exam Vitals and nursing note reviewed.  Constitutional:      Appearance: She is well-developed.  HENT:     Head: Normocephalic.     Ears:     Comments: Left TM clear Right ear canal swollen, unable to visualize TM, tender palpation Pulmonary:     Effort: Pulmonary effort is normal.  Abdominal:     General: There is no distension.  Musculoskeletal:        General: Normal range of motion.     Cervical back: Normal range of motion.  Neurological:     Mental Status: She is alert and oriented to person, place, and time.      UC Treatments / Results  Labs (all labs ordered are listed, but only abnormal results are displayed) Labs Reviewed - No data to display  EKG   Radiology No results found.  Procedures Procedures (including critical care time)  Medications Ordered in UC Medications - No data to display  Initial Impression / Assessment and Plan / UC Course  I have reviewed the triage vital signs and the nursing notes.  Pertinent labs & imaging results that were available during my care of the patient were reviewed by me and considered in my medical decision making (see chart for details).     Patient counseled on external otitis./Swimmer's ear.  Patient given prescription for Cortisporin and amoxicillin she is advised to return if any problems see her primary care physician for recheck in 1 week. Final Clinical Impressions(s) / UC Diagnoses   Final diagnoses:  Acute otitis externa of right ear, unspecified type     Discharge Instructions      Return if any problems.    ED Prescriptions     Medication Sig Dispense Auth. Provider    neomycin-polymyxin-hydrocortisone (CORTISPORIN) OTIC solution Place 4 drops into the right ear 4 (four) times daily. 10 mL Cherelle Midkiff K, PA-C   amoxicillin (AMOXIL) 500 MG capsule Take 1 capsule (500 mg total) by mouth 3 (three) times daily. 30 capsule Elson Areas, [place]      PDMP not reviewed this encounter. An After Visit Summary was printed and given to the patient.       Elson Areas, [place] 04/15/23 [phone]

## 2023-04-15 NOTE — ED Triage Notes (Signed)
Pt states her right ear is swollen and painful X 1 week. She takes pain meds at home but they aren't helping

## 2023-04-15 NOTE — Discharge Instructions (Addendum)
Return if any problems.

## 2024-04-27 DIAGNOSIS — G8929 Other chronic pain: Secondary | ICD-10-CM | POA: Insufficient documentation

## 2024-04-27 DIAGNOSIS — Z720 Tobacco use: Secondary | ICD-10-CM | POA: Insufficient documentation

## 2024-04-27 DIAGNOSIS — F419 Anxiety disorder, unspecified: Secondary | ICD-10-CM | POA: Insufficient documentation

## 2024-04-27 DIAGNOSIS — B3731 Acute candidiasis of vulva and vagina: Secondary | ICD-10-CM | POA: Insufficient documentation

## 2024-05-01 ENCOUNTER — Telehealth: Payer: Self-pay | Admitting: Neurology

## 2024-05-01 ENCOUNTER — Encounter: Payer: Self-pay | Admitting: Neurology

## 2024-05-01 ENCOUNTER — Ambulatory Visit: Admitting: Neurology

## 2024-05-01 NOTE — Telephone Encounter (Signed)
 Patient rescheduled appointment due to having cough, throwing up, fever.

## 2024-06-21 ENCOUNTER — Ambulatory Visit (HOSPITAL_COMMUNITY)

## 2024-06-28 ENCOUNTER — Other Ambulatory Visit (HOSPITAL_COMMUNITY): Payer: Self-pay

## 2024-06-28 ENCOUNTER — Ambulatory Visit (HOSPITAL_COMMUNITY)
Admission: RE | Admit: 2024-06-28 | Discharge: 2024-06-28 | Disposition: A | Discharge status: Home / Self Care | Source: Ambulatory Visit | Attending: Vascular Surgery | Admitting: Vascular Surgery

## 2024-06-28 ENCOUNTER — Ambulatory Visit (HOSPITAL_BASED_OUTPATIENT_CLINIC_OR_DEPARTMENT_OTHER)
Admission: RE | Admit: 2024-06-28 | Discharge: 2024-06-28 | Disposition: A | Discharge status: Home / Self Care | Source: Ambulatory Visit | Attending: Vascular Surgery | Admitting: Vascular Surgery

## 2024-06-28 DIAGNOSIS — I739 Peripheral vascular disease, unspecified: Secondary | ICD-10-CM

## 2024-06-28 LAB — VAS US ABI WITH/WO TBI
Left ABI: 1.02
Right ABI: 1.01

## 2024-09-25 ENCOUNTER — Ambulatory Visit: Admitting: Neurology
